# Patient Record
Sex: Female | Born: 1985
Health system: Southern US, Community
[De-identification: ages and names within clinical notes are randomized; demographics above are authoritative.]

## PROBLEM LIST (undated history)

## (undated) DIAGNOSIS — D219 Benign neoplasm of connective and other soft tissue, unspecified: Secondary | ICD-10-CM

## (undated) DIAGNOSIS — I10 Essential (primary) hypertension: Secondary | ICD-10-CM

## (undated) DIAGNOSIS — E119 Type 2 diabetes mellitus without complications: Secondary | ICD-10-CM

## (undated) DIAGNOSIS — Z3183 Encounter for assisted reproductive fertility procedure cycle: Secondary | ICD-10-CM

## (undated) HISTORY — DX: Encounter for assisted reproductive fertility procedure cycle: Z31.83

## (undated) HISTORY — DX: Type 2 diabetes mellitus without complications: E11.9

## (undated) HISTORY — PX: OTHER SURGICAL HISTORY: SHX169

---

## 2013-07-14 ENCOUNTER — Ambulatory Visit (INDEPENDENT_AMBULATORY_CARE_PROVIDER_SITE_OTHER): Payer: Self-pay | Admitting: Emergency Medicine

## 2013-07-14 VITALS — BP 102/64 | HR 58 | Temp 98.3°F | Ht 66.5 in | Wt 136.8 lb

## 2013-07-14 DIAGNOSIS — E119 Type 2 diabetes mellitus without complications: Secondary | ICD-10-CM

## 2013-07-14 LAB — GLUCOSE, POCT (MANUAL RESULT ENTRY): POC Glucose: 93 mg/dl (ref 70–99)

## 2013-07-14 LAB — POCT GLYCOSYLATED HEMOGLOBIN (HGB A1C): Hemoglobin A1C: 5.5

## 2013-07-14 NOTE — Progress Notes (Signed)
   Subjective:    Patient ID: Tina Brown, female    DOB: [birth year], 27 y.o.   MRN: [id number]  HPI  27 y.o. Female here from the [place] having trouble with her diabetes. She is on insulin and unable to remember what she takes. Left insulin at home. Takes 15 units once a day. Had her last injection of insulin was 5 days before coming to the [place]. Is drinking a lot of water and feels like she is having to void a lot more then normal. Took insulin at 10 am . Remembers insulin medication being a mixture. She states she checks her sugar about one time a month . Her last sugar she checked was 200.   Review of Systems     Objective:   Physical Exam patient is alert and cooperative she is in no distress. Her neck is supple. Her chest is clear. Heart regular rate no murmurs. Abdomen is soft no masses  Results for orders placed in visit on 07/14/13  GLUCOSE, POCT (MANUAL RESULT ENTRY)      Result Value Range   POC Glucose 93  70 - 99 mg/dl  POCT GLYCOSYLATED HEMOGLOBIN (HGB A1C)      Result Value Range   Hemoglobin A1C 5.5          Assessment & Plan:  Patient will check her sugar every day. We will make a decision at that time whether she needs to be on insulin or not. Afraid to put her on insulin with her sugars being so low. They are agreeable to check her sugar every day see me in about a week. If they see her sugar starting to rise we'll start her on a low-dose 70/30 mix around 7-10 units a day

## 2013-07-15 LAB — BASIC METABOLIC PANEL
BUN: 13 mg/dL (ref 6–23)
CO2: 23 mEq/L (ref 19–32)
Calcium: 9.6 mg/dL (ref 8.4–10.5)
Chloride: 104 mEq/L (ref 96–112)
Creat: 0.75 mg/dL (ref 0.50–1.10)
Glucose, Bld: 85 mg/dL (ref 70–99)
Potassium: 3.8 mEq/L (ref 3.5–5.3)
Sodium: 136 mEq/L (ref 135–145)

## 2013-07-21 ENCOUNTER — Ambulatory Visit (INDEPENDENT_AMBULATORY_CARE_PROVIDER_SITE_OTHER): Payer: Self-pay | Admitting: Emergency Medicine

## 2013-07-21 VITALS — BP 108/62 | HR 61 | Temp 98.7°F | Resp 18 | Ht 67.0 in | Wt 135.0 lb

## 2013-07-21 DIAGNOSIS — E119 Type 2 diabetes mellitus without complications: Secondary | ICD-10-CM

## 2013-07-21 NOTE — Progress Notes (Addendum)
Subjective:    Patient ID: Tina Brown, female    DOB: April 24, 1986, 28 y.o.   MRN: [id number] This chart was scribed for Nena Jordan, MD  by Quintin Alto, ED Scribe. This patient was seen in room 4 and the patient's care was started at 11:05 AM.  HPI Comments: Tina Brown is a 28 y.o. female who presents to the Emergency Department after she was seen here last week for checkup of her diabetes, she is here today for the same. She is recently is from [place] about 10 days ago and had been on insulin for the past 10 years. When she was seen here last, she did not have her insulin w/her b/c she left it in [place] and although was not on her insulin; she had normal blood sugar levels. Today she has her readings with her which appear normal. Pt reports that she has been monitoring her sugar intake and has been recording her glucose. Pt is feeling well, and denies any change in diet and getting up in the middle of the night for urination. Pt has a history of chronic diabetes.     Patient Active Problem List   Diagnosis Date Noted  . Diabetes 07/14/2013    Results for orders placed in visit on 08/67/61  BASIC METABOLIC PANEL      Result Value Range   Sodium 136  135 - 145 mEq/L   Potassium 3.8  3.5 - 5.3 mEq/L   Chloride 104  96 - 112 mEq/L   CO2 23  19 - 32 mEq/L   Glucose, Bld 85  70 - 99 mg/dL   BUN 13  6 - 23 mg/dL   Creat 0.75  0.50 - 1.10 mg/dL   Calcium 9.6  8.4 - 10.5 mg/dL  GLUCOSE, POCT (MANUAL RESULT ENTRY)      Result Value Range   POC Glucose 93  70 - 99 mg/dl  POCT GLYCOSYLATED HEMOGLOBIN (HGB A1C)      Result Value Range   Hemoglobin A1C 5.5      History   Social History  . Marital Status: Married    Spouse Name: N/A    Number of Children: N/A  . Years of Education: N/A   Occupational History  . Not on file.   Social History Main Topics  . Smoking status: Never Smoker   . Smokeless tobacco: Not on file  . Alcohol Use: No  . Drug Use: No  . Sexual Activity: Yes     Other Topics Concern  . Not on file   Social History Narrative  . No narrative on file      HPI    Review of Systems     Objective:   Physical Exam  . DIAGNOSTIC STUDIES: Oxygen Saturation is 99% on RA, normal by my interpretation.    COORDINATION OF CARE: 11:14 AM- Pt advised of plan for treatment and pt agrees.      Assessment & Plan:  Patient brings in her blood sugar readings and they vary between 77 for low at 134 high. She has no readings greater than 130. Will withhold treatment for diabetes at the present time. She is agreeable to check her sugars twice a week. If her sugars start to rise she needs to return to clinic to consider what medications are appropriate for her to be on.. I personally performed the services described in this documentation, which was scribed in my presence. The recorded information has been reviewed and is accurate.

## 2013-12-03 ENCOUNTER — Encounter (HOSPITAL_COMMUNITY): Payer: Self-pay | Admitting: *Deleted

## 2013-12-03 ENCOUNTER — Inpatient Hospital Stay (HOSPITAL_COMMUNITY)
Admission: AD | Admit: 2013-12-03 | Discharge: 2013-12-03 | Disposition: A | Discharge status: Home / Self Care | Payer: No Typology Code available for payment source | Source: Ambulatory Visit | Attending: Obstetrics & Gynecology | Admitting: Obstetrics & Gynecology

## 2013-12-03 DIAGNOSIS — B3731 Acute candidiasis of vulva and vagina: Secondary | ICD-10-CM | POA: Insufficient documentation

## 2013-12-03 DIAGNOSIS — E119 Type 2 diabetes mellitus without complications: Secondary | ICD-10-CM | POA: Insufficient documentation

## 2013-12-03 DIAGNOSIS — B373 Candidiasis of vulva and vagina: Secondary | ICD-10-CM

## 2013-12-03 DIAGNOSIS — R300 Dysuria: Secondary | ICD-10-CM | POA: Insufficient documentation

## 2013-12-03 DIAGNOSIS — M545 Low back pain, unspecified: Secondary | ICD-10-CM | POA: Insufficient documentation

## 2013-12-03 LAB — URINALYSIS, ROUTINE W REFLEX MICROSCOPIC
Bilirubin Urine: NEGATIVE
Glucose, UA: NEGATIVE mg/dL
Hgb urine dipstick: NEGATIVE
Ketones, ur: NEGATIVE mg/dL
Leukocytes, UA: NEGATIVE
Nitrite: NEGATIVE
Protein, ur: NEGATIVE mg/dL
Specific Gravity, Urine: 1.01 (ref 1.005–1.030)
Urobilinogen, UA: 0.2 mg/dL (ref 0.0–1.0)
pH: 7 (ref 5.0–8.0)

## 2013-12-03 LAB — WET PREP, GENITAL
Clue Cells Wet Prep HPF POC: NONE SEEN
Trich, Wet Prep: NONE SEEN

## 2013-12-03 LAB — POCT PREGNANCY, URINE: Preg Test, Ur: NEGATIVE

## 2013-12-03 MED ORDER — FLUCONAZOLE 150 MG PO TABS: 150.0000 mg | ORAL_TABLET | Freq: Every day | ORAL | Status: DC

## 2013-12-03 MED ORDER — FLUCONAZOLE 150 MG PO TABS
150.0000 mg | ORAL_TABLET | Freq: Once | ORAL | Status: AC
  Administered 2013-12-03: 150 mg via ORAL
  Filled 2013-12-03: qty 1

## 2013-12-03 NOTE — Discharge Instructions (Signed)
Candidal Vulvovaginitis Candidal vulvovaginitis is an infection of the vagina and vulva. The vulva is the skin around the opening of the vagina. This may cause itching and discomfort in and around the vagina.  HOME CARE  Only take medicine as told by your doctor.  Do not have sex (intercourse) until the infection is healed or as told by your doctor.  Practice safe sex.  Tell your sex partner about your infection.  Do not douche or use tampons.  Wear cotton underwear. Do not wear tight pants or panty hose.  Eat yogurt. This may help treat and prevent yeast infections. GET HELP RIGHT AWAY IF:   You have a fever.  Your problems get worse during treatment or do not get better in 3 days.  You have discomfort, irritation, or itching in your vagina or vulva area.  You have pain after sex.  You start to get belly (abdominal) pain. MAKE SURE YOU:  Understand these instructions.  Will watch your condition.  Will get help right away if you are not doing well or get worse. Document Released: 09/27/2008 Document Revised: 09/23/2011 Document Reviewed: 09/27/2008 [facility] Patient Information 2014 [place], [place].   Over-the-counter Ovulation Kits - at [employer], [facility], [facility] or [facility]

## 2013-12-03 NOTE — MAU Note (Signed)
PT  DOES NOT [place].Marland Kitchen     SAYS SHE WENT TO [facility] 06-2013-  THEY TOLD HER SHE WAS NOT DIABETIC.     SAYS BURNING WITH VOIDING- STARTED ON WED,  AND  HAS WHITE D/C  WITH URINE.    HAS HAD UTI BEFORE.  LAST SEX-Tuesday.  NO BIRTH CONTROL

## 2013-12-03 NOTE — MAU Provider Note (Signed)
History     CSN: [id number]  Arrival date and time: 12/03/13 2138   First Provider Initiated Contact with Patient 12/03/13 2209      No chief complaint on file.  HPI Ms. Tina Brown is a 28 y.o. G0P0 who presents to MAU today with complaint of dysuria x 3 days. The patient also endorses urinary frequency and vaginal discharge that is white. She denies urgency or fever. She has mild low back pain, but denies pelvic pain. LMP 11/22/13. Last intercourse 11/30/13. No birth control.    OB History   Grav Para Term Preterm Abortions TAB SAB Ect Mult Living   0               Past Medical History  Diagnosis Date  . Diabetes mellitus without complication     History reviewed. No pertinent past surgical history.  Family History  Problem Relation Age of Onset  . Diabetes Father     History  Substance Use Topics  . Smoking status: Never Smoker   . Smokeless tobacco: Not on file  . Alcohol Use: No    Allergies: No Known Allergies  No prescriptions prior to admission    Review of Systems  Constitutional: Negative for fever and malaise/fatigue.  Gastrointestinal: Positive for abdominal pain. Negative for nausea, vomiting and diarrhea.  Genitourinary: Positive for dysuria and frequency. Negative for urgency, hematuria and flank pain.       Neg - vaginal bleeding + vaginal discharge  Musculoskeletal: Positive for back pain.   Physical Exam   Blood pressure 113/59, pulse 71, temperature 97.4 F (36.3 C), temperature source Oral, resp. rate 20, height 5\' 6"  (1.676 m), weight 145 lb (65.772 kg).  Physical Exam  Constitutional: She is oriented to person, place, and time. She appears well-developed and well-nourished. No distress.  HENT:  Head: Normocephalic and atraumatic.  Cardiovascular: Normal rate.   Respiratory: Effort normal.  GI: Soft. Bowel sounds are normal. She exhibits no distension and no mass. There is no tenderness. There is no rebound, no guarding and no CVA  tenderness.  Genitourinary: Vaginal discharge (small amount of thick, white discharge noted) found.  Patient unable to tolerate speculum exam. Swabs obtained.   Neurological: She is alert and oriented to person, place, and time.  Skin: Skin is warm and dry. No erythema.  Psychiatric: She has a normal mood and affect.   Results for orders placed during the hospital encounter of 12/03/13 (from the past 24 hour(s))  URINALYSIS, ROUTINE W REFLEX MICROSCOPIC     Status: None   Collection Time    12/03/13  9:50 PM      Result Value Ref Range   Color, Urine YELLOW  YELLOW   APPearance CLEAR  CLEAR   Specific Gravity, Urine 1.010  1.005 - 1.030   pH 7.0  5.0 - 8.0   Glucose, UA NEGATIVE  NEGATIVE mg/dL   Hgb urine dipstick NEGATIVE  NEGATIVE   Bilirubin Urine NEGATIVE  NEGATIVE   Ketones, ur NEGATIVE  NEGATIVE mg/dL   Protein, ur NEGATIVE  NEGATIVE mg/dL   Urobilinogen, UA 0.2  0.0 - 1.0 mg/dL   Nitrite NEGATIVE  NEGATIVE   Leukocytes, UA NEGATIVE  NEGATIVE  POCT PREGNANCY, URINE     Status: None   Collection Time    12/03/13  9:55 PM      Result Value Ref Range   Preg Test, Ur NEGATIVE  NEGATIVE  WET PREP, GENITAL     Status: Abnormal  Collection Time    12/03/13 10:20 PM      Result Value Ref Range   Yeast Wet Prep HPF POC FEW (*) NONE SEEN   Trich, Wet Prep NONE SEEN  NONE SEEN   Clue Cells Wet Prep HPF POC NONE SEEN  NONE SEEN   WBC, Wet Prep HPF POC FEW (*) NONE SEEN    MAU Course  Procedures None  MDM UPT - negative UA, wet prep and GC/Chlamydia today Diflucan 150 mg given in MAU Assessment and Plan  A: Yeast vulvovaginitis  P: Discharge home Rx for Diflucan given to patient to be taken in 3 days if symptoms persist Patient may return to MAU as needed or if her condition were to change or worsen  Farris Has, PA-C  12/03/2013, 10:52 PM

## 2013-12-04 LAB — GC/CHLAMYDIA PROBE AMP
CT Probe RNA: NEGATIVE
GC Probe RNA: NEGATIVE

## 2013-12-04 NOTE — MAU Provider Note (Signed)
Attestation of Attending Supervision of Advanced Practitioner (CNM/NP): Evaluation and management procedures were performed by the Advanced Practitioner under my supervision and collaboration.  I have reviewed the Advanced Practitioner's note and chart, and I agree with the management and plan.  Jesiah Grismer Harraway-Smith 12:01 AM

## 2014-01-24 ENCOUNTER — Other Ambulatory Visit (HOSPITAL_COMMUNITY): Payer: Self-pay | Admitting: Obstetrics & Gynecology

## 2014-01-24 DIAGNOSIS — N979 Female infertility, unspecified: Secondary | ICD-10-CM

## 2014-01-31 ENCOUNTER — Encounter (INDEPENDENT_AMBULATORY_CARE_PROVIDER_SITE_OTHER): Payer: Self-pay

## 2014-01-31 ENCOUNTER — Ambulatory Visit (HOSPITAL_COMMUNITY)
Admission: RE | Admit: 2014-01-31 | Discharge: 2014-01-31 | Disposition: A | Discharge status: Home / Self Care | Payer: No Typology Code available for payment source | Source: Ambulatory Visit | Attending: Obstetrics & Gynecology | Admitting: Obstetrics & Gynecology

## 2014-01-31 DIAGNOSIS — N979 Female infertility, unspecified: Secondary | ICD-10-CM | POA: Insufficient documentation

## 2014-01-31 MED ORDER — IOHEXOL 300 MG/ML  SOLN
20.0000 mL | Freq: Once | INTRAMUSCULAR | Status: AC | PRN
  Administered 2014-01-31: 20 mL

## 2014-02-24 ENCOUNTER — Encounter: Payer: Self-pay | Admitting: Internal Medicine

## 2014-03-18 ENCOUNTER — Ambulatory Visit: Payer: No Typology Code available for payment source | Admitting: Internal Medicine

## 2014-04-08 ENCOUNTER — Ambulatory Visit (INDEPENDENT_AMBULATORY_CARE_PROVIDER_SITE_OTHER): Payer: No Typology Code available for payment source | Admitting: Internal Medicine

## 2014-04-08 ENCOUNTER — Encounter: Payer: Self-pay | Admitting: Internal Medicine

## 2014-04-08 VITALS — BP 94/65 | HR 67 | Temp 98.3°F | Ht 66.0 in | Wt 142.8 lb

## 2014-04-08 DIAGNOSIS — E118 Type 2 diabetes mellitus with unspecified complications: Secondary | ICD-10-CM

## 2014-04-08 MED ORDER — METFORMIN HCL 500 MG PO TABS: 500.0000 mg | ORAL_TABLET | Freq: Two times a day (BID) | ORAL | Status: DC

## 2014-04-08 NOTE — Progress Notes (Signed)
Patient ID: Tina Brown, female   DOB: 02-04-1986, 28 y.o.   MRN: [id number]  HPI: Noah Pelaez is a 28 y.o.-year-old female from [place]), referred by Dr. Dellis Filbert, for management of DM2, non-insulin-dependent, controlled, without complications. She is here with her aunt who translates for [place].  Patient has been diagnosed with diabetes in 2008; she was on insulin before, stopped in 9 mo ago.  Last hemoglobin A1c was: 01/19/2014: HbA1c 7.4% Lab Results  Component Value Date   HGBA1C 5.5 07/14/2013   Pt is on: - Metformin 500 mg in am (despite advised to take it 2x a day >> was feeling tired on this dose). She has nausea in am.  Pt checks her sugars once a day and they are: - am: 86, 100-120 - 2h after b'fast: - before lunch: - 2h after lunch: - before dinner: n/c - 1h after dinner: 190 - bedtime: n/c - nighttime: n/c No lows. Lowest sugar was 86;? she has hypoglycemia awareness. Highest sugar was 190.  Pt's meals are: - Breakfast: egg, milk - Lunch: chicken, fruit, veggies - Dinner: yoghurt, milk - Snacks: 1 a day  - no CKD, last BUN/creatinine:  Lab Results  Component Value Date   BUN 13 07/14/2013   CREATININE 0.75 07/14/2013  Not on ACEI. - no HL. No available Lipid results. Not on a statin. - last eye exam was "long time ago". No DR.  - no numbness and tingling in her feet.  Pt has FH of DM in father, PGM.  ROS: Constitutional: + weight loss, no fatigue, no subjective hyperthermia/hypothermia Eyes: no blurry vision, no xerophthalmia ENT: no sore throat, no nodules palpated in throat, no dysphagia/odynophagia, no hoarseness Cardiovascular: no CP/+ SOB/no palpitations/leg swelling Respiratory: no cough/+ SOB Gastrointestinal: no N/V/D/C Musculoskeletal: no muscle/joint aches Skin: no rashes, + hair loss Neurological: no tremors/numbness/tingling/dizziness Psychiatric: no depression/anxiety  Past Medical History  Diagnosis Date  . Diabetes mellitus without  complication    No past surgical history other than DM2.   History   Social History  . Marital Status: Married    Spouse Name: N/A    Number of Children: 0   Occupational History  . student   Social History Main Topics  . Smoking status: Never Smoker   . Smokeless tobacco: Not on file  . Alcohol Use: No  . Drug Use: No  . Sexual Activity: Yes   NKDA  Meds: - Metformin 500 mg daily in am  No Known Allergies Family History  Problem Relation Age of Onset  . Diabetes Father    PE: BP 94/65  Pulse 67  Temp(Src) 98.3 F (36.8 C) (Oral)  Ht 5\' 6"  (1.676 m)  Wt 142 lb 12.8 oz (64.774 kg)  BMI 23.06 kg/m2  SpO2 98% Wt Readings from Last 3 Encounters:  04/08/14 142 lb 12.8 oz (64.774 kg)  12/03/13 145 lb (65.772 kg)  07/21/13 135 lb (61.236 kg)   Constitutional: normal weight, in NAD Eyes: PERRLA, EOMI, no exophthalmos ENT: moist mucous membranes, + symmetric thyromegaly, no cervical lymphadenopathy Cardiovascular: RRR, No MRG Respiratory: CTA B Gastrointestinal: abdomen soft, NT, ND, BS+ Musculoskeletal: no deformities, strength intact in all 4 Skin: moist, warm, no rashes Neurological: no tremor with outstretched hands, DTR normal in all 4  ASSESSMENT: 1. DM2, non-insulin-dependent, controlled, without complications  PLAN:  1. Patient with long-standing, controlled diabetes, previously on insulin while in [place], off meds for 7 mo after coming to [place] and now on Metformin 500 mg in am  for the last 2 months. A1c off meds >> 5.5% >> 7.5%. - We discussed about options for treatment, and I suggested to:  Patient Instructions  Please increase Metformin to 500 mg 2x a day with meals. Please return in 1.5 months with your sugar log.  - Strongly advised her to start checking sugars at different times of the day - check 1-2 times a day, rotating checks - given sugar log and advised how to fill it and to bring it at next appt  - given foot care handout and explained the  principles  - given instructions for hypoglycemia management "15-15 rule"  - advised for yearly eye exams >> needs one - Return to clinic in 1.5 mo with sugar log

## 2014-04-08 NOTE — Patient Instructions (Signed)
Please increase Metformin to 500 mg 2x a day with meals.  Please return in 1.5 months with your sugar log.   PATIENT INSTRUCTIONS FOR TYPE 2 DIABETES:  **Please join MyChart!** - see attached instructions about how to join if you have not done so already.  DIET AND EXERCISE Diet and exercise is an important part of diabetic treatment.  We recommended aerobic exercise in the form of brisk walking (working between 40-60% of maximal aerobic capacity, similar to brisk walking) for 150 minutes per week (such as 30 minutes five days per week) along with 3 times per week performing 'resistance' training (using various gauge rubber tubes with handles) 5-10 exercises involving the major muscle groups (upper body, lower body and core) performing 10-15 repetitions (or near fatigue) each exercise. Start at half the above goal but build slowly to reach the above goals. If limited by weight, joint pain, or disability, we recommend daily walking in a swimming pool with water up to waist to reduce pressure from joints while allow for adequate exercise.    BLOOD GLUCOSES Monitoring your blood glucoses is important for continued management of your diabetes. Please check your blood glucoses 2-4 times a day: fasting, before meals and at bedtime (you can rotate these measurements - e.g. one day check before the 3 meals, the next day check before 2 of the meals and before bedtime, etc.).   HYPOGLYCEMIA (low blood sugar) Hypoglycemia is usually a reaction to not eating, exercising, or taking too much insulin/ other diabetes drugs.  Symptoms include tremors, sweating, hunger, confusion, headache, etc. Treat IMMEDIATELY with 15 grams of Carbs:   4 glucose tablets    cup regular juice/soda   2 tablespoons raisins   4 teaspoons sugar   1 tablespoon honey Recheck blood glucose in 15 mins and repeat above if still symptomatic/blood glucose <100.  RECOMMENDATIONS TO REDUCE YOUR RISK OF DIABETIC COMPLICATIONS: * Take  your prescribed MEDICATION(S) * Follow a DIABETIC diet: Complex carbs, fiber rich foods, (monounsaturated and polyunsaturated) fats * AVOID saturated/trans fats, high fat foods, >2,300 mg salt per day. * EXERCISE at least 5 times a week for 30 minutes or preferably daily.  * DO NOT SMOKE OR DRINK more than 1 drink a day. * Check your FEET every day. Do not wear tightfitting shoes. Contact us if you develop an ulcer * See your EYE doctor once a year or more if needed * Get a FLU shot once a year * Get a PNEUMONIA vaccine once before and once after age 57 years  GOALS:  * Your Hemoglobin A1c of <7%  * fasting sugars need to be <130 * after meals sugars need to be <180 (2h after you start eating) * Your Systolic BP should be 092 or lower  * Your Diastolic BP should be 80 or lower  * Your HDL (Good Cholesterol) should be 40 or higher  * Your LDL (Bad Cholesterol) should be 100 or lower. * Your Triglycerides should be 150 or lower  * Your Urine microalbumin (kidney function) should be <30 * Your Body Mass Index should be 25 or lower

## 2014-04-08 NOTE — Progress Notes (Signed)
Pre visit review using our clinic review tool, if applicable. No additional management support is needed unless otherwise documented below in the visit note. 

## 2014-05-20 ENCOUNTER — Ambulatory Visit: Payer: No Typology Code available for payment source | Admitting: Internal Medicine

## 2014-05-20 ENCOUNTER — Telehealth: Payer: Self-pay | Admitting: Internal Medicine

## 2014-05-20 DIAGNOSIS — Z0289 Encounter for other administrative examinations: Secondary | ICD-10-CM

## 2014-05-20 NOTE — Telephone Encounter (Signed)
1-3 mo

## 2014-05-20 NOTE — Telephone Encounter (Signed)
Patient no showed today's appt. Please advise on how to follow up. °A. No follow up necessary. °B. Follow up urgent. Contact patient immediately. °C. Follow up necessary. Contact patient and schedule visit in ___ days. °D. Follow up advised. Contact patient and schedule visit in ____weeks. ° °

## 2015-05-02 DIAGNOSIS — R739 Hyperglycemia, unspecified: Secondary | ICD-10-CM

## 2015-05-02 NOTE — Congregational Nurse Program (Signed)
Office visit to request B/S testing and information about diabetes. Unable to perform own testing due to instrument failure. Currently on Metformin HCL 500 MG Bid. Concerned about weight loss. Need PCP. B/P 102/65. Pulse 87. Blood sugar 394 non-fasting. Weight 134.1. Height 166cm.  Plan: Use Arabic interpreter; Continue to take med as directed; advise walking exercise x 3 weekly; increase  water intake. Review disease management and dietary management; written materials in Arabic, food portions and foods comparable to own cultural foods. Refer to agency SW, [place] for [employer]. Refer to PCP once O.C. assigned. Return for B/S recheck 05/03/15 fasting at 9:30am. Need diabetic education classes in future.

## 2015-05-20 ENCOUNTER — Other Ambulatory Visit: Payer: Self-pay | Admitting: Internal Medicine

## 2015-06-20 DIAGNOSIS — E119 Type 2 diabetes mellitus without complications: Secondary | ICD-10-CM

## 2015-07-05 ENCOUNTER — Encounter: Payer: Self-pay | Admitting: Internal Medicine

## 2015-07-05 ENCOUNTER — Other Ambulatory Visit (INDEPENDENT_AMBULATORY_CARE_PROVIDER_SITE_OTHER): Payer: Self-pay | Admitting: *Deleted

## 2015-07-05 ENCOUNTER — Ambulatory Visit (INDEPENDENT_AMBULATORY_CARE_PROVIDER_SITE_OTHER): Payer: Self-pay | Admitting: Internal Medicine

## 2015-07-05 VITALS — BP 102/60 | HR 86 | Temp 97.4°F | Resp 12 | Wt 138.0 lb

## 2015-07-05 DIAGNOSIS — E119 Type 2 diabetes mellitus without complications: Secondary | ICD-10-CM

## 2015-07-05 LAB — POCT GLYCOSYLATED HEMOGLOBIN (HGB A1C): Hemoglobin A1C: 14.2

## 2015-07-05 MED ORDER — INSULIN PEN NEEDLE 32G X 4 MM MISC: Status: DC

## 2015-07-05 MED ORDER — INSULIN NPH (HUMAN) (ISOPHANE) 100 UNIT/ML ~~LOC~~ SUSP: SUBCUTANEOUS | Status: DC

## 2015-07-05 NOTE — Progress Notes (Signed)
Patient ID: Tina Brown, female   DOB: 07-Jul-1986, 29 y.o.   MRN: [id number]  HPI: Tina Brown is a 29 y.o.-year-old female from [place]), initially referred by Dr. Dellis Filbert, for management of DM2, dx 2008, insulin-dependent, controlled, without complications. She is here by herself, but she does not speak English very well.  No insurance.  She was on insulin before, stopped in 2.5 years ago.  She has increased thirst and urination. She also has weight loss, blurry vision.  Last hemoglobin A1c was: 01/19/2014: HbA1c 7.4% Lab Results  Component Value Date   HGBA1C 5.5 07/14/2013   Pt is on: - Metformin 500 mg 1x a day  (did not increase to 2x a day)  Pt does not check sugars. No meter. Sugars from last visit: - am: 86, 100-120 - 2h after b'fast: - before lunch: - 2h after lunch: - before dinner: n/c - 1h after dinner: 190 - bedtime: n/c - nighttime: n/c  Pt's meals are: - Breakfast: egg, milk - Lunch: chicken, fruit, veggies - Dinner: yoghurt, milk - Snacks: 1 a day  - no CKD, last BUN/creatinine:  Lab Results  Component Value Date   BUN 13 07/14/2013   CREATININE 0.75 07/14/2013  Not on ACEI. - no HL. No available Lipid results. Not on a statin. - last eye exam was "long time ago". No DR.  - no numbness and tingling in her feet.  ROS: Constitutional: + weight loss, no fatigue, no subjective hyperthermia/hypothermia, + nocturia Eyes: no blurry vision, no xerophthalmia ENT: no sore throat, no nodules palpated in throat, no dysphagia/odynophagia, no hoarseness Cardiovascular: no CP/+ SOB/no palpitations/leg swelling Respiratory: no cough/+ SOB Gastrointestinal: no N/V/D/C Musculoskeletal: no muscle/joint aches Skin: no rashes, + hair loss Neurological: no tremors/numbness/tingling/dizziness  I reviewed pt's medications, allergies, PMH, social hx, family hx, and changes were documented in the history of present illness. Otherwise, unchanged from my initial visit  note.  Past Medical History  Diagnosis Date  . Diabetes mellitus without complication ([place])    No past surgical history other than DM2.   History   Social History  . Marital Status: Married    Spouse Name: N/A    Number of Children: 0   Occupational History  . student   Social History Main Topics  . Smoking status: Never Smoker   . Smokeless tobacco: Not on file  . Alcohol Use: No  . Drug Use: No  . Sexual Activity: Yes   NKDA  Meds: - Metformin 500 mg daily in am  No Known Allergies Family History  Problem Relation Age of Onset  . Diabetes Father    PE: BP 102/60 mmHg  Pulse 86  Temp(Src) 97.4 F (36.3 C) (Oral)  Resp 12  Wt 138 lb (62.596 kg)  SpO2 98% Wt Readings from Last 3 Encounters:  07/05/15 138 lb (62.596 kg)  04/08/14 142 lb 12.8 oz (64.774 kg)  12/03/13 145 lb (65.772 kg)   Constitutional: normal weight, in NAD Eyes: PERRLA, EOMI, no exophthalmos ENT: moist mucous membranes, + symmetric thyromegaly, no cervical lymphadenopathy Cardiovascular: RRR, No MRG Respiratory: CTA B Gastrointestinal: abdomen soft, NT, ND, BS+ Musculoskeletal: no deformities, strength intact in all 4 Skin: moist, warm, no rashes Neurological: no tremor with outstretched hands, DTR normal in all 4  ASSESSMENT: 1. DM2, insulin-dependent, controlled, without complications  PLAN:  1. Patient with long-standing, controlled diabetes, previously on insulin while in [place], off meds at last visit last year. She was lost for f/u >> now comes  with sxs of hyperglycemia and not checking sugars. Checked HbA1c today >> 14.2% (very high) >> we need basal insulin. Leonia Reader (DM edu) is showing pt how to do the injections and giving her 1st inj. - I suggested to:  Patient Instructions  Please buy a ReliOn glucometer from [place].  Please continue Metformin 500 mg daily.  Please start NPH insulin; - 15 units in am - 10 units at bedtime  If sugars still high in 1 week,  increase to: - 20 units in am  - 15 units at bedtime  Check sugars 2x a day until next visit.  Please return in 3-4 weeks with your sugar log. Please bring an interpreter with you.  - Strongly advised her to start checking sugars at different times of the day - check 2 times a day, rotating checks - advised for yearly eye exams >> needs one - Return to clinic in 3-4 weeks with sugar log

## 2015-07-05 NOTE — Patient Instructions (Addendum)
Please buy a ReliOn glucometer from [place].  Please continue Metformin 500 mg daily.  Please start NPH insulin; - 15 units in am - 10 units at bedtime  If sugars still high in 1 week, increase to: - 20 units in am  - 15 units at bedtime  Check sugars 2x a day until next visit.  Please return in 3-4 weeks with your sugar log. Please bring an interpreter with you.

## 2015-08-29 ENCOUNTER — Ambulatory Visit: Payer: Self-pay | Admitting: Internal Medicine

## 2015-09-06 ENCOUNTER — Telehealth: Payer: Self-pay | Admitting: Internal Medicine

## 2015-09-06 ENCOUNTER — Other Ambulatory Visit: Payer: Self-pay | Admitting: Internal Medicine

## 2015-09-06 NOTE — Telephone Encounter (Signed)
Patient has question about her medication, I couldn't understand her, please give her a call.

## 2015-09-07 MED ORDER — INSULIN NPH (HUMAN) (ISOPHANE) 100 UNIT/ML ~~LOC~~ SUSP: SUBCUTANEOUS | Status: DC

## 2015-09-07 NOTE — Telephone Encounter (Signed)
Pt is requesting refill of insulin please call it to # [phone] [facility]

## 2015-09-07 NOTE — Telephone Encounter (Signed)
Refill sent to pt's pharmacy. 

## 2015-09-13 NOTE — Telephone Encounter (Signed)
Returned pt's call and lvm with cousin.

## 2015-11-01 DIAGNOSIS — R309 Painful micturition, unspecified: Secondary | ICD-10-CM

## 2015-11-01 DIAGNOSIS — E119 Type 2 diabetes mellitus without complications: Secondary | ICD-10-CM

## 2015-11-01 DIAGNOSIS — N898 Other specified noninflammatory disorders of vagina: Secondary | ICD-10-CM

## 2015-11-01 NOTE — Congregational Nurse Program (Unsigned)
Congregational Nurse Program Note  Date of Encounter: 11/01/2015  Past Medical History: No past medical history on file.  Encounter Details:     CNP Questionnaire - 11/01/15 2038    Patient Demographics   Is this a new or existing patient? Existing   Patient Assistance   Uninsured Patient Yes   Patient referred to apply for the following financial assistance [employer] or to a Navigator   Food insecurities addressed Not Applicable   Encounter Details   Patient referred to Urgent Care     Office visit to see nurse by this Arabic speaking [place] lady. Able to speak limited English during interview; need interpreter for complex medical information. Complain today is about a vaginal discharge with itching for several days. Also has low back pain and urinary pain. Controls diabetes with insulin. Currently concerned about medical coverage status; applying for health coverage with assistance from the Student law intern at [employer]. Expressed concern about her and spouse's  inability to conceive a child at this age. Became very emotional talking about her childless marriage. Requested information about infertility and treatment. Plan: Refer to [facility]. Discuss insurance issues with agency SW. Return one week for further education and information about diabetes. Provide information about infertility specialists and support counseling for couple.  Jannetta Quint, RN/CN. [phone]  Follow-up office visit to get more information on infertility and treatment centers. Currently has no insurance coverage. Aware of need for medical insurance before proceeding with further evaluations. Very distressed about childless marriage. Denies problems with diabetes. Continues on daily insulin. Did not bring dosage information. Plan:Referred back to SW at [facility] to apply for [facility] until enrollment in [facility]. Printed information on infertility from [employer]. Follow-up 1 week for blood pressure  recheck and review dietary needs for diabetes; review daily caloric intake. Monitor weight in future. Increase H2O intake. Return 1 week. Lynnann Knudsen,RN/CN. [phone].

## 2015-12-01 ENCOUNTER — Encounter (HOSPITAL_COMMUNITY): Payer: Self-pay | Admitting: Emergency Medicine

## 2015-12-01 ENCOUNTER — Emergency Department (HOSPITAL_COMMUNITY)
Admission: EM | Admit: 2015-12-01 | Discharge: 2015-12-02 | Disposition: A | Discharge status: Home / Self Care | Payer: Self-pay | Attending: Emergency Medicine | Admitting: Emergency Medicine

## 2015-12-01 DIAGNOSIS — L293 Anogenital pruritus, unspecified: Secondary | ICD-10-CM | POA: Insufficient documentation

## 2015-12-01 DIAGNOSIS — N898 Other specified noninflammatory disorders of vagina: Secondary | ICD-10-CM | POA: Insufficient documentation

## 2015-12-01 DIAGNOSIS — Z79899 Other long term (current) drug therapy: Secondary | ICD-10-CM | POA: Insufficient documentation

## 2015-12-01 DIAGNOSIS — Z794 Long term (current) use of insulin: Secondary | ICD-10-CM | POA: Insufficient documentation

## 2015-12-01 DIAGNOSIS — Z7984 Long term (current) use of oral hypoglycemic drugs: Secondary | ICD-10-CM | POA: Insufficient documentation

## 2015-12-01 DIAGNOSIS — Z3202 Encounter for pregnancy test, result negative: Secondary | ICD-10-CM | POA: Insufficient documentation

## 2015-12-01 DIAGNOSIS — E119 Type 2 diabetes mellitus without complications: Secondary | ICD-10-CM | POA: Insufficient documentation

## 2015-12-01 LAB — CBC WITH DIFFERENTIAL/PLATELET
Basophils Absolute: 0 10*3/uL (ref 0.0–0.1)
Basophils Relative: 1 %
Eosinophils Absolute: 0.1 10*3/uL (ref 0.0–0.7)
Eosinophils Relative: 2 %
HCT: 41.1 % (ref 36.0–46.0)
Hemoglobin: 13.6 g/dL (ref 12.0–15.0)
Lymphocytes Relative: 45 %
Lymphs Abs: 2.8 10*3/uL (ref 0.7–4.0)
MCH: 28.2 pg (ref 26.0–34.0)
MCHC: 33.1 g/dL (ref 30.0–36.0)
MCV: 85.3 fL (ref 78.0–100.0)
Monocytes Absolute: 0.5 10*3/uL (ref 0.1–1.0)
Monocytes Relative: 8 %
Neutro Abs: 2.7 10*3/uL (ref 1.7–7.7)
Neutrophils Relative %: 44 %
Platelets: 276 10*3/uL (ref 150–400)
RBC: 4.82 MIL/uL (ref 3.87–5.11)
RDW: 12.8 % (ref 11.5–15.5)
WBC: 6.1 10*3/uL (ref 4.0–10.5)

## 2015-12-01 LAB — BASIC METABOLIC PANEL
Anion gap: 12 (ref 5–15)
BUN: 10 mg/dL (ref 6–20)
CO2: 23 mmol/L (ref 22–32)
Calcium: 9.3 mg/dL (ref 8.9–10.3)
Chloride: 98 mmol/L — ABNORMAL LOW (ref 101–111)
Creatinine, Ser: 0.6 mg/dL (ref 0.44–1.00)
GFR calc Af Amer: >60 mL/min (ref >60)
GFR calc non Af Amer: >60 mL/min (ref >60)
Glucose, Bld: 396 mg/dL — ABNORMAL HIGH (ref 65–99)
Potassium: 3.9 mmol/L (ref 3.5–5.1)
Sodium: 133 mmol/L — ABNORMAL LOW (ref 135–145)

## 2015-12-01 LAB — URINALYSIS, ROUTINE W REFLEX MICROSCOPIC
Bilirubin Urine: NEGATIVE
Glucose, UA: >1000 mg/dL — AB
Hgb urine dipstick: NEGATIVE
Ketones, ur: 40 mg/dL — AB
Leukocytes, UA: NEGATIVE
Nitrite: NEGATIVE
Protein, ur: NEGATIVE mg/dL
Specific Gravity, Urine: 1.02 (ref 1.005–1.030)
pH: 5 (ref 5.0–8.0)

## 2015-12-01 LAB — URINE MICROSCOPIC-ADD ON: RBC / HPF: NONE SEEN RBC/hpf (ref 0–5)

## 2015-12-01 LAB — WET PREP, GENITAL
Clue Cells Wet Prep HPF POC: NONE SEEN
Sperm: NONE SEEN
Trich, Wet Prep: NONE SEEN
Yeast Wet Prep HPF POC: NONE SEEN

## 2015-12-01 LAB — CBG MONITORING, ED: Glucose-Capillary: 274 mg/dL — ABNORMAL HIGH (ref 65–99)

## 2015-12-01 LAB — POC URINE PREG, ED: Preg Test, Ur: NEGATIVE

## 2015-12-01 NOTE — ED Notes (Signed)
Pt. reports white vaginal discharge , vaginal itching/irritation and dysuria onset this week . Arabic interpreter service utilized at triage .

## 2015-12-01 NOTE — ED Provider Notes (Signed)
CSN: [id number]     Arrival date & time 12/01/15  1959 History   First MD Initiated Contact with Patient 12/01/15 2132     Chief Complaint  Patient presents with  . Vaginal Discharge     (Consider location/radiation/quality/duration/timing/severity/associated sxs/prior Treatment) HPI Comments: Patient presents today with a chief complaint of vaginal discharge and vaginal itching.  She reports that the discharge and itching have been present since yesterday.  Discharge is whitish in color.  She staes that she has a similar symptoms in the past.  She denies nausea, vomiting, abdominal pain, pelvic pain, fever, chills, or urinary symptoms.  She states that she is sexually active with her husband.  She does have a history of DM and states that her blood sugars have been running in the 200's.  She states that she is currently on Metformin, which she has been taking.  She reports that she is not taking Insulin.    Patient is a 30 y.o. female presenting with vaginal discharge. The history is provided by the patient. The history is limited by a language barrier. A language interpreter was used (Estate agent used).  Vaginal Discharge   Past Medical History  Diagnosis Date  . Diabetes mellitus without complication ([place])    History reviewed. No pertinent past surgical history. Family History  Problem Relation Age of Onset  . Diabetes Father    Social History  Substance Use Topics  . Smoking status: Never Smoker   . Smokeless tobacco: None  . Alcohol Use: No   OB History    Gravida Para Term Preterm AB TAB SAB Ectopic Multiple Living   0              Review of Systems  Genitourinary: Positive for vaginal discharge.  All other systems reviewed and are negative.     Allergies  Review of patient's allergies indicates no known allergies.  Home Medications   Prior to Admission medications   Medication Sig Start Date End Date Taking? Authorizing Provider  Biotin w/  Vitamins C & E (HAIR/SKIN/NAILS PO) Take 1 tablet by mouth daily as needed.   Yes Historical Provider, MD  folic acid (FOLVITE) A999333 MCG tablet Take 400 mcg by mouth daily.   Yes Historical Provider, MD  metFORMIN (GLUCOPHAGE) 500 MG tablet TAKE 1 TABLET (500 MG TOTAL) BY MOUTH 2 (TWO) TIMES DAILY WITH A MEAL. 09/07/15  Yes Philemon Kingdom, MD  insulin NPH Human (HUMULIN N) 100 UNIT/ML injection Inject 15 units in am and 10 units at bedtime - pens please. Patient not taking: Reported on 12/01/2015 09/07/15   Philemon Kingdom, MD  Insulin Pen Needle (CAREFINE PEN NEEDLES) 32G X 4 MM MISC Use 2x  aday 07/05/15   Philemon Kingdom, MD   BP 129/94 mmHg  Pulse 86  Temp(Src) 98.7 F (37.1 C) (Oral)  Resp 18  SpO2 98%  LMP 11/18/2015 Physical Exam  Constitutional: She appears well-developed and well-nourished.  HENT:  Head: Normocephalic and atraumatic.  Mouth/Throat: Oropharynx is clear and moist.  Neck: Normal range of motion. Neck supple.  Cardiovascular: Normal rate, regular rhythm and normal heart sounds.   Pulmonary/Chest: Effort normal and breath sounds normal.  Abdominal: Soft. Bowel sounds are normal. She exhibits no distension and no mass. There is no tenderness. There is no rebound and no guarding.  Genitourinary: Cervix exhibits no motion tenderness. Right adnexum displays no mass, no tenderness and no fullness. Left adnexum displays no mass, no tenderness and no fullness.  Small amount of whitish colored discharge  Neurological: She is alert.  Skin: Skin is warm and dry.  Psychiatric: She has a normal mood and affect.  Nursing note and vitals reviewed.   ED Course  Procedures (including critical care time) Labs Review Labs Reviewed  URINALYSIS, ROUTINE W REFLEX MICROSCOPIC (NOT AT [facility]) - Abnormal; Notable for the following:    Glucose, UA >1000 (*)    Ketones, ur 40 (*)    All other components within normal limits  BASIC METABOLIC PANEL - Abnormal; Notable for the following:     Sodium 133 (*)    Chloride 98 (*)    Glucose, Bld 396 (*)    All other components within normal limits  URINE MICROSCOPIC-ADD ON - Abnormal; Notable for the following:    Squamous Epithelial / LPF 0-5 (*)    Bacteria, UA RARE (*)    All other components within normal limits  WET PREP, GENITAL  CBC WITH DIFFERENTIAL/PLATELET  RPR  HIV ANTIBODY (ROUTINE TESTING)  POC URINE PREG, ED  GC/CHLAMYDIA PROBE AMP ([place]) NOT AT [facility]    Imaging Review No results found. I have personally reviewed and evaluated these images and lab results as part of my medical decision-making.   EKG Interpretation None      MDM   Final diagnoses:  None   Patient presents today with complaints of vaginal itching and whitish colored discharge that has been present since yesterday.  GC/Chlamydia pending.  Wet prep is negative aside from [facility].  No CMT or adnexal tenderness on exam.   Feel that the patient is stable for discharge.  Return precautions given.    Hyman Bible, PA-C 12/03/15 [place], MD 12/03/15 2330

## 2015-12-01 NOTE — ED Notes (Signed)
CBG 274. RN notified.

## 2015-12-02 LAB — RPR: RPR Ser Ql: NONREACTIVE

## 2015-12-02 LAB — HIV ANTIBODY (ROUTINE TESTING W REFLEX): HIV Screen 4th Generation wRfx: NONREACTIVE

## 2015-12-02 MED ORDER — FLUCONAZOLE 150 MG PO TABS: 150.0000 mg | ORAL_TABLET | Freq: Once | ORAL | Status: DC

## 2015-12-02 NOTE — ED Notes (Signed)
Patient able to ambulate independently Interpreter phone used for discharge instructions

## 2015-12-02 NOTE — Discharge Instructions (Signed)
I think you may have a vaginal yeast infection.

## 2015-12-04 LAB — GC/CHLAMYDIA PROBE AMP (~~LOC~~) NOT AT ARMC
Chlamydia: NEGATIVE
Neisseria Gonorrhea: NEGATIVE

## 2016-01-08 ENCOUNTER — Other Ambulatory Visit: Payer: Self-pay | Admitting: Internal Medicine

## 2016-02-27 DIAGNOSIS — E119 Type 2 diabetes mellitus without complications: Secondary | ICD-10-CM

## 2016-02-29 ENCOUNTER — Telehealth: Payer: Self-pay | Admitting: Internal Medicine

## 2016-02-29 NOTE — Telephone Encounter (Deleted)
Dr Dellis Filbert office need notes from patient first appointment. Fax # [phone]

## 2016-02-29 NOTE — Telephone Encounter (Signed)
Dr Dellis Filbert office need notes from patient first appointment. Fax # [phone]

## 2016-03-04 ENCOUNTER — Inpatient Hospital Stay (HOSPITAL_COMMUNITY)
Admission: EM | Admit: 2016-03-04 | Discharge: 2016-03-06 | DRG: 639 | Disposition: A | Discharge status: Home / Self Care | Payer: Self-pay | Attending: Internal Medicine | Admitting: Internal Medicine

## 2016-03-04 ENCOUNTER — Ambulatory Visit (HOSPITAL_COMMUNITY)
Admission: EM | Admit: 2016-03-04 | Discharge: 2016-03-04 | Disposition: A | Discharge status: Nursing Home (Includes ICF) | Payer: Self-pay | Attending: Family Medicine | Admitting: Family Medicine

## 2016-03-04 ENCOUNTER — Encounter (HOSPITAL_COMMUNITY): Payer: Self-pay | Admitting: Emergency Medicine

## 2016-03-04 ENCOUNTER — Encounter (HOSPITAL_COMMUNITY): Payer: Self-pay | Admitting: *Deleted

## 2016-03-04 DIAGNOSIS — R5383 Other fatigue: Secondary | ICD-10-CM

## 2016-03-04 DIAGNOSIS — R739 Hyperglycemia, unspecified: Secondary | ICD-10-CM | POA: Diagnosis present

## 2016-03-04 DIAGNOSIS — Z79899 Other long term (current) drug therapy: Secondary | ICD-10-CM

## 2016-03-04 DIAGNOSIS — E1165 Type 2 diabetes mellitus with hyperglycemia: Secondary | ICD-10-CM

## 2016-03-04 DIAGNOSIS — R5381 Other malaise: Secondary | ICD-10-CM

## 2016-03-04 DIAGNOSIS — E876 Hypokalemia: Secondary | ICD-10-CM

## 2016-03-04 DIAGNOSIS — R7309 Other abnormal glucose: Secondary | ICD-10-CM

## 2016-03-04 DIAGNOSIS — G6289 Other specified polyneuropathies: Secondary | ICD-10-CM

## 2016-03-04 DIAGNOSIS — R531 Weakness: Secondary | ICD-10-CM

## 2016-03-04 DIAGNOSIS — E1310 Other specified diabetes mellitus with ketoacidosis without coma: Principal | ICD-10-CM | POA: Diagnosis present

## 2016-03-04 DIAGNOSIS — E118 Type 2 diabetes mellitus with unspecified complications: Secondary | ICD-10-CM

## 2016-03-04 DIAGNOSIS — E860 Dehydration: Secondary | ICD-10-CM

## 2016-03-04 DIAGNOSIS — Z794 Long term (current) use of insulin: Secondary | ICD-10-CM

## 2016-03-04 DIAGNOSIS — R824 Acetonuria: Secondary | ICD-10-CM

## 2016-03-04 LAB — CBC
HCT: 44 % (ref 36.0–46.0)
Hemoglobin: 14.3 g/dL (ref 12.0–15.0)
MCH: 29.5 pg (ref 26.0–34.0)
MCHC: 32.5 g/dL (ref 30.0–36.0)
MCV: 90.7 fL (ref 78.0–100.0)
Platelets: 290 10*3/uL (ref 150–400)
RBC: 4.85 MIL/uL (ref 3.87–5.11)
RDW: 13.3 % (ref 11.5–15.5)
WBC: 5.4 10*3/uL (ref 4.0–10.5)

## 2016-03-04 LAB — BASIC METABOLIC PANEL
Anion gap: 15 (ref 5–15)
Anion gap: 9 (ref 5–15)
BUN: 9 mg/dL (ref 6–20)
BUN: 8 mg/dL (ref 6–20)
CO2: 20 mmol/L — ABNORMAL LOW (ref 22–32)
CO2: 23 mmol/L (ref 22–32)
Calcium: 9.2 mg/dL (ref 8.9–10.3)
Calcium: 8.7 mg/dL — ABNORMAL LOW (ref 8.9–10.3)
Chloride: 101 mmol/L (ref 101–111)
Chloride: 107 mmol/L (ref 101–111)
Creatinine, Ser: 0.63 mg/dL (ref 0.44–1.00)
Creatinine, Ser: 0.48 mg/dL (ref 0.44–1.00)
GFR calc Af Amer: >60 mL/min (ref >60)
GFR calc Af Amer: >60 mL/min (ref >60)
GFR calc non Af Amer: >60 mL/min (ref >60)
GFR calc non Af Amer: >60 mL/min (ref >60)
Glucose, Bld: 382 mg/dL — ABNORMAL HIGH (ref 65–99)
Glucose, Bld: 121 mg/dL — ABNORMAL HIGH (ref 65–99)
Potassium: 3.7 mmol/L (ref 3.5–5.1)
Potassium: 3.1 mmol/L — ABNORMAL LOW (ref 3.5–5.1)
Sodium: 136 mmol/L (ref 135–145)
Sodium: 139 mmol/L (ref 135–145)

## 2016-03-04 LAB — POCT I-STAT, CHEM 8
BUN: 10 mg/dL (ref 6–20)
Calcium, Ion: 1.16 mmol/L (ref 1.13–1.30)
Chloride: 99 mmol/L — ABNORMAL LOW (ref 101–111)
Creatinine, Ser: 0.3 mg/dL — ABNORMAL LOW (ref 0.44–1.00)
Glucose, Bld: 398 mg/dL — ABNORMAL HIGH (ref 65–99)
HCT: 48 % — ABNORMAL HIGH (ref 36.0–46.0)
Hemoglobin: 16.3 g/dL — ABNORMAL HIGH (ref 12.0–15.0)
Potassium: 4 mmol/L (ref 3.5–5.1)
Sodium: 136 mmol/L (ref 135–145)
TCO2: 20 mmol/L (ref 0–100)

## 2016-03-04 LAB — URINALYSIS, ROUTINE W REFLEX MICROSCOPIC
Bilirubin Urine: NEGATIVE
Glucose, UA: >1000 mg/dL — AB
Ketones, ur: >80 mg/dL — AB
Leukocytes, UA: NEGATIVE
Nitrite: NEGATIVE
Protein, ur: NEGATIVE mg/dL
Specific Gravity, Urine: 1.046 — ABNORMAL HIGH (ref 1.005–1.030)
pH: 5 (ref 5.0–8.0)

## 2016-03-04 LAB — I-STAT BETA HCG BLOOD, ED (MC, WL, AP ONLY): I-stat hCG, quantitative: <5 m[IU]/mL (ref <5)

## 2016-03-04 LAB — URINE MICROSCOPIC-ADD ON: RBC / HPF: NONE SEEN RBC/hpf (ref 0–5)

## 2016-03-04 LAB — GLUCOSE, CAPILLARY
Glucose-Capillary: 104 mg/dL — ABNORMAL HIGH (ref 65–99)
Glucose-Capillary: 99 mg/dL (ref 65–99)
Glucose-Capillary: 348 mg/dL — ABNORMAL HIGH (ref 65–99)

## 2016-03-04 LAB — CBG MONITORING, ED
Glucose-Capillary: 369 mg/dL — ABNORMAL HIGH (ref 65–99)
Glucose-Capillary: 190 mg/dL — ABNORMAL HIGH (ref 65–99)

## 2016-03-04 LAB — LACTIC ACID, PLASMA: Lactic Acid, Venous: 0.9 mmol/L (ref 0.5–1.9)

## 2016-03-04 MED ORDER — ACETAMINOPHEN 325 MG PO TABS: 650.0000 mg | ORAL_TABLET | Freq: Four times a day (QID) | ORAL | Status: DC | PRN

## 2016-03-04 MED ORDER — ACETAMINOPHEN 650 MG RE SUPP: 650.0000 mg | Freq: Four times a day (QID) | RECTAL | Status: DC | PRN

## 2016-03-04 MED ORDER — OXYCODONE HCL 5 MG PO TABS: 5.0000 mg | ORAL_TABLET | ORAL | Status: DC | PRN

## 2016-03-04 MED ORDER — POTASSIUM CHLORIDE CRYS ER 20 MEQ PO TBCR
40.0000 meq | EXTENDED_RELEASE_TABLET | Freq: Every day | ORAL | Status: AC
  Administered 2016-03-04 – 2016-03-05 (×2): 40 meq via ORAL
  Filled 2016-03-04 (×2): qty 2

## 2016-03-04 MED ORDER — INSULIN ASPART 100 UNIT/ML ~~LOC~~ SOLN
0.0000 [IU] | Freq: Every day | SUBCUTANEOUS | Status: DC
  Administered 2016-03-04: 4 [IU] via SUBCUTANEOUS

## 2016-03-04 MED ORDER — ONDANSETRON HCL 4 MG/2ML IJ SOLN: 4.0000 mg | Freq: Four times a day (QID) | INTRAMUSCULAR | Status: DC | PRN

## 2016-03-04 MED ORDER — ONDANSETRON HCL 4 MG PO TABS: 4.0000 mg | ORAL_TABLET | Freq: Four times a day (QID) | ORAL | Status: DC | PRN

## 2016-03-04 MED ORDER — ENOXAPARIN SODIUM 40 MG/0.4ML ~~LOC~~ SOLN
40.0000 mg | SUBCUTANEOUS | Status: DC
  Administered 2016-03-04 – 2016-03-05 (×2): 40 mg via SUBCUTANEOUS
  Filled 2016-03-04 (×3): qty 0.4

## 2016-03-04 MED ORDER — INSULIN ASPART 100 UNIT/ML ~~LOC~~ SOLN
0.0000 [IU] | Freq: Three times a day (TID) | SUBCUTANEOUS | Status: DC
  Administered 2016-03-05: 3 [IU] via SUBCUTANEOUS

## 2016-03-04 MED ORDER — MAGNESIUM SULFATE 2 GM/50ML IV SOLN
2.0000 g | Freq: Once | INTRAVENOUS | Status: AC
  Administered 2016-03-04: 2 g via INTRAVENOUS
  Filled 2016-03-04: qty 50

## 2016-03-04 MED ORDER — INSULIN ASPART 100 UNIT/ML ~~LOC~~ SOLN
10.0000 [IU] | Freq: Once | SUBCUTANEOUS | Status: AC
  Administered 2016-03-04: 10 [IU] via SUBCUTANEOUS
  Filled 2016-03-04: qty 1

## 2016-03-04 MED ORDER — SODIUM CHLORIDE 0.9 % IV SOLN
INTRAVENOUS | Status: DC
  Administered 2016-03-04 – 2016-03-06 (×4): via INTRAVENOUS

## 2016-03-04 MED ORDER — SODIUM CHLORIDE 0.9 % IV BOLUS (SEPSIS)
1000.0000 mL | Freq: Once | INTRAVENOUS | Status: AC
  Administered 2016-03-04: 1000 mL via INTRAVENOUS

## 2016-03-04 NOTE — ED Provider Notes (Signed)
CSN: [id number]     Arrival date & time 03/04/16  1001 History   First MD Initiated Contact with Patient 03/04/16 1106     Chief Complaint  Patient presents with  . Hyperglycemia  . Vaginal Discharge   (Consider location/radiation/quality/duration/timing/severity/associated sxs/prior Treatment) 30 year old female is an immigrant from a [place] country who has been in [place] for 2 years and 8 months. She was diagnosed with diabetes several years ago. She apparently has seen a primary care provider in [place] wants however without insurance she was not able to return. At that visit she was taken off insulin and placed on up he will of which she does not know the name. Over time her blood sugars have continued to rise and has been under poor control. Lately she has not been able to test her blood sugars because her glucometer has broken. She complains of intermittent blurry vision, intermittent numbness and pain in her toes and feet although this is not a complaint of today. The report from the congregation on her includes blood pressure of 121 today and blood pressure 100/69 and pulse of 96. Patient has been having polyuria.      Past Medical History:  Diagnosis Date  . Diabetes mellitus without complication ([place])    History reviewed. No pertinent surgical history. Family History  Problem Relation Age of Onset  . Diabetes Father    Social History  Substance Use Topics  . Smoking status: Never Smoker  . Smokeless tobacco: Never Used  . Alcohol use No   OB History    Gravida Para Term Preterm AB Living   0             SAB TAB Ectopic Multiple Live Births                 Review of Systems  Constitutional: Positive for activity change and unexpected weight change. Negative for fever.  HENT: Negative.   Eyes: Positive for visual disturbance.  Respiratory: Negative.  Negative for cough and choking.   Cardiovascular: Negative for chest pain, palpitations and leg  swelling.  Gastrointestinal: Negative.   Endocrine: Positive for polyuria.  Genitourinary: Negative for difficulty urinating and menstrual problem.  Musculoskeletal: Negative.   Skin: Negative.   Neurological: Positive for light-headedness.  Hematological: Negative.   All other systems reviewed and are negative.   Allergies  Review of patient's allergies indicates no known allergies.  Home Medications   Prior to Admission medications   Medication Sig Start Date End Date Taking? Authorizing Provider  metFORMIN (GLUCOPHAGE) 500 MG tablet TAKE 1 TABLET (500 MG TOTAL) BY MOUTH 2 (TWO) TIMES DAILY WITH A MEAL. 01/08/16  Yes Philemon Kingdom, MD  Biotin w/ Vitamins C & E (HAIR/SKIN/NAILS PO) Take 1 tablet by mouth daily as needed.    Historical Provider, MD  fluconazole (DIFLUCAN) 150 MG tablet Take 1 tablet (150 mg total) by mouth once. 12/02/15   Hyman Bible, PA-C  folic acid (FOLVITE) A999333 MCG tablet Take 400 mcg by mouth daily.    Historical Provider, MD  insulin NPH Human (HUMULIN N) 100 UNIT/ML injection Inject 15 units in am and 10 units at bedtime - pens please. Patient not taking: Reported on 12/01/2015 09/07/15   Philemon Kingdom, MD  Insulin Pen Needle (CAREFINE PEN NEEDLES) 32G X 4 MM MISC Use 2x  aday 07/05/15   Philemon Kingdom, MD   Meds Ordered and Administered this Visit  Medications - No data to display  BP 123/78 (BP  Location: Left Arm)   Pulse 86   Temp 98.7 F (37.1 C) (Oral)   LMP 02/28/2016 (Exact Date)   SpO2 100%  No data found.   Physical Exam  Constitutional: She is oriented to person, place, and time. She appears well-developed and well-nourished. No distress.  HENT:  Head: Normocephalic and atraumatic.  Eyes: EOM are normal.  Neck: Normal range of motion. Neck supple.  Cardiovascular: Normal rate, regular rhythm and normal heart sounds.   Pulmonary/Chest: Effort normal and breath sounds normal. No respiratory distress.  Abdominal: Soft. There is no  tenderness.  Neurological: She is alert and oriented to person, place, and time. She exhibits normal muscle tone.  Skin: Skin is warm and dry.  Psychiatric: She has a normal mood and affect.  Nursing note and vitals reviewed.   Urgent Care Course   Clinical Course    Procedures (including critical care time)  Labs Review Labs Reviewed  POCT I-STAT, CHEM 8 - Abnormal; Notable for the following:       Result Value   Chloride 99 (*)    Creatinine, Ser 0.30 (*)    Glucose, Bld 398 (*)    Hemoglobin 16.3 (*)    HCT 48.0 (*)    All other components within normal limits   Results for orders placed or performed during the hospital encounter of 03/04/16  I-STAT, chem 8  Result Value Ref Range   Sodium 136 135 - 145 mmol/L   Potassium 4.0 3.5 - 5.1 mmol/L   Chloride 99 (L) 101 - 111 mmol/L   BUN 10 6 - 20 mg/dL   Creatinine, Ser 0.30 (L) 0.44 - 1.00 mg/dL   Glucose, Bld 398 (H) 65 - 99 mg/dL   Calcium, Ion 1.16 1.13 - 1.30 mmol/L   TCO2 20 0 - 100 mmol/L   Hemoglobin 16.3 (H) 12.0 - 15.0 g/dL   HCT 48.0 (H) 36.0 - 46.0 %     Imaging Review No results found.   Visual Acuity Review  Right Eye Distance:   Left Eye Distance:   Bilateral Distance:    Right Eye Near:   Left Eye Near:    Bilateral Near:         MDM   1. Type 2 diabetes mellitus with hyperglycemia, without long-term current use of insulin (HCC)   2. Dehydration   3. Other polyneuropathy ([place])   30 year old Arabic female with history of type 2 diabetes mellitus with very little primary care since her arrival to [place] 2 years and 8 months ago. Is unsure as to whether she is taking one of her "little pills are not. She does believe that her blood sugars have been increasing and out of control. She is having occasional blurred vision, burning and numbness of the toes and feet, feeling weak and fatigued and blood sugars today around 400 or higher. She states it will be a long time before she would be  able to see a primary care provider. She will likely need IV fluids and management for hyperglycemia. Potassium is 4.0. She is being transferred via shuttle to the emergency department.     Janne Napoleon, NP 03/04/16 1133

## 2016-03-04 NOTE — ED Notes (Signed)
Pt made aware of bed assignment 

## 2016-03-04 NOTE — ED Triage Notes (Signed)
The patient presented to the [facility] with multiple complaints. The patient was evaluated by the [facility] RN and referred to the [facility] . The patient had a complaint of consistently elevated CBG and requested it to be checked. The patient has no home glucometer and no PCP according to the RN. The patient also reported vaginal itching and a discharge.

## 2016-03-04 NOTE — Progress Notes (Signed)
Follow up appointment made to establish primary care with the [facility] for Friday October 6,2017 @ 11:00am. Attempt made to obtain an earlier appointment with [facility] but unsuccessful.[place] card application also provided. Information provided to patients nurse to give with discharge instructions. My contact information also provided for any future questions or concerns.  [place] Specialist [id number] [phone]

## 2016-03-04 NOTE — ED Provider Notes (Signed)
[facility] DEPT Provider Note   CSN: [id number] Arrival date & time: 03/04/16  1247     History   Chief Complaint Chief Complaint  Patient presents with  . Hyperglycemia    HPI Tina Brown is a 30 y.o. female.  HPI History of DM (untreated, no insurance, no PCP) who presents from [facility] for hyperglycemia.  She had a routine check at school from a nurse who noticed her BGL high, and sent her here.  She endorses increased thirst and increased urination.  She takes a medicine BID for DM but doesn't know what it is called (record says metformin).  She is currently not on insulin.     Past Medical History:  Diagnosis Date  . Diabetes mellitus without complication [facility])     Patient Active Problem List   Diagnosis Date Noted  . Diabetes mellitus without complication ([place]) [id number]    History reviewed. No pertinent surgical history.  OB History    Gravida Para Term Preterm AB Living   0             SAB TAB Ectopic Multiple Live Births                   Home Medications    Prior to Admission medications   Medication Sig Start Date End Date Taking? Authorizing Provider  Biotin w/ Vitamins C & E (HAIR/SKIN/NAILS PO) Take 1 tablet by mouth daily as needed.    Historical Provider, MD  fluconazole (DIFLUCAN) 150 MG tablet Take 1 tablet (150 mg total) by mouth once. 12/02/15   Hyman Bible, PA-C  folic acid (FOLVITE) A999333 MCG tablet Take 400 mcg by mouth daily.    Historical Provider, MD  insulin NPH Human (HUMULIN N) 100 UNIT/ML injection Inject 15 units in am and 10 units at bedtime - pens please. Patient not taking: Reported on 12/01/2015 09/07/15   Philemon Kingdom, MD  Insulin Pen Needle (CAREFINE PEN NEEDLES) 32G X 4 MM MISC Use 2x  aday 07/05/15   Philemon Kingdom, MD  metFORMIN (GLUCOPHAGE) 500 MG tablet TAKE 1 TABLET (500 MG TOTAL) BY MOUTH 2 (TWO) TIMES DAILY WITH A MEAL. 01/08/16   Philemon Kingdom, MD    Family History Family History  Problem Relation Age of  Onset  . Diabetes Father     Social History Social History  Substance Use Topics  . Smoking status: Never Smoker  . Smokeless tobacco: Never Used  . Alcohol use No     Allergies   Review of patient's allergies indicates no known allergies.   Review of Systems Review of Systems  Constitutional: Negative for chills and fever.  HENT: Negative for ear pain and sore throat.   Eyes: Positive for visual disturbance (blurred). Negative for pain.  Respiratory: Negative for cough and shortness of breath.   Cardiovascular: Negative for chest pain and palpitations.  Gastrointestinal: Negative for abdominal pain and vomiting.  Endocrine: Positive for polydipsia and polyuria.  Genitourinary: Negative for dysuria and hematuria.  Musculoskeletal: Negative for arthralgias and back pain.  Skin: Negative for color change and rash.  Neurological: Negative for seizures and syncope.  All other systems reviewed and are negative.    Physical Exam Updated Vital Signs BP 110/64   Pulse 85   Temp 98.2 F (36.8 C) (Oral)   Resp 16   Wt 61 kg   LMP 02/28/2016 (Exact Date)   SpO2 99%   BMI 21.71 kg/m   Physical Exam  Constitutional: She appears well-developed  and well-nourished. No distress.  HENT:  Head: Normocephalic and atraumatic.  Eyes: Conjunctivae are normal.  Acuity: Ou: 20/20 Od: 20/20 Os: 20/20  Fundoscopic: R: No papilledema, no cell and flare, no hyphema L: No papilledema,  no cell and flare, no hyphema Visual fields intact bilaterally   Neck: Neck supple.  Cardiovascular: Normal rate and regular rhythm.   No murmur heard. Pulmonary/Chest: Effort normal and breath sounds normal. No respiratory distress.  Abdominal: Soft. There is no tenderness.  Musculoskeletal: She exhibits no edema.  Neurological: She is alert.  Skin: Skin is warm and dry.  Psychiatric: She has a normal mood and affect.  Nursing note and vitals reviewed.    ED Treatments / Results   Labs (all labs ordered are listed, but only abnormal results are displayed) Labs Reviewed  BASIC METABOLIC PANEL - Abnormal; Notable for the following:       Result Value   CO2 20 (*)    Glucose, Bld 382 (*)    All other components within normal limits  CBG MONITORING, ED - Abnormal; Notable for the following:    Glucose-Capillary 369 (*)    All other components within normal limits  CBC  URINALYSIS, ROUTINE W REFLEX MICROSCOPIC (NOT AT [facility])  I-STAT BETA HCG BLOOD, ED ([facility], [facility], [facility] ONLY)    EKG  EKG Interpretation None       Radiology No results found.  Procedures Procedures (including critical care time)  Medications Ordered in ED Medications - No data to display   Initial Impression / Assessment and Plan / ED Course  I have reviewed the triage vital signs and the nursing notes.  Pertinent labs & imaging results that were available during my care of the patient were reviewed by me and considered in my medical decision making (see chart for details).  Clinical Course    Patient presents for hyperglycemia, from clinic.  She has had poor control.  BGL elevated here, mild acidosis with mild AG (15).  UA with >80 ketones.  Patient hydrated and given insulin.  Suspect dehydration with possibly beginnings of DKA.  Regarding visual symptoms, she has normal exam here and I doubt emergent conditions of the eye.  She has poor f/u.  CM saw patient and was able to secure f/u in October.  Will admit for continued hydration.     Final Clinical Impressions(s) / ED Diagnoses   Final diagnoses:  None    New Prescriptions New Prescriptions   No medications on file     Levada Schilling, MD 03/04/16 1559    Davonna Belling, MD 03/05/16 [phone]

## 2016-03-04 NOTE — ED Notes (Signed)
Pt states, "I have diabetes & take a pill in the morning & at night. I went to see my nurse at school because I cannot check my blood sugar at home because I do not have a machine. She told me to go to Urgent care for high blood sugar & than I came here."

## 2016-03-04 NOTE — ED Triage Notes (Signed)
Pt was sent to ucc by [place] congregational nurse. Pt has hx of diabetes but no glucometer at home. Reports blurry vision and nausea. Sent here due to hyperglycemia. No acute distress noted at triage. Used translator ID [id number].

## 2016-03-04 NOTE — ED Notes (Signed)
CBG 190

## 2016-03-04 NOTE — H&P (Addendum)
History and Physical    Tina Brown [id number] DOB: [birth year] DOA: 03/04/2016  PCP: Jenny Reichmann, MD Patient coming from: Home  Chief Complaint: generalized weakness  HPI: Tina Brown is a 30 y.o. female with medical history significant of diabetes. Patient tolerated by Arabic interpreter. 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 Patient states that she has not followed up with a physician or taken her insulin for several months due to loss of insurance and cost of appointments and medicine. Patient also states that her glucose meter is now broken. One millimeter was working glucose was ranging in the 2-300 range. Patient endorses several day history of polydipsia and polyuria with generalized weakness. Symptoms are fairly constant and getting worse. Nothing makes her symptoms better. Patient denies any dysuria, back pain, fevers, chest pain, shortness of breath, palpitations, nausea, vomiting, diarrhea, neck stiffness, headache. Patient has been compliant with her metformin.   ED Course: objective findings below. Given IVF and Insulin  Review of Systems: As per HPI otherwise 10 point review of systems negative.   Ambulatory Status:no restrictions  Past Medical History:  Diagnosis Date  . Diabetes mellitus without complication ([place])     History reviewed. No pertinent surgical history.  Social History   Social History  . Marital status: Married    Spouse name: N/A  . Number of children: N/A  . Years of education: N/A   Occupational History  . Not on file.   Social History Main Topics  . Smoking status: Never Smoker  . Smokeless tobacco: Never Used  . Alcohol use No  . Drug use: No  . Sexual activity: Not on file   Other Topics Concern  . Not on file   Social History Narrative  . No narrative on file    No Known Allergies  Family History  Problem Relation Age of Onset  . Diabetes Father      Prior to Admission medications   Medication Sig Start Date End Date Taking? Authorizing Provider  Insulin NPH Human, Isophane, (HUMULIN N KWIKPEN Carmine) Inject 10-15 Units into the skin 2 (two) times daily. 15 in the morning and 10 units at bedtime   Yes Historical Provider, MD  Insulin Pen Needle (CAREFINE PEN NEEDLES) 32G X 4 MM MISC Use 2x  aday 07/05/15  Yes Philemon Kingdom, MD  metFORMIN (GLUCOPHAGE) 500 MG tablet TAKE 1 TABLET (500 MG TOTAL) BY MOUTH 2 (TWO) TIMES DAILY WITH A MEAL. 01/08/16  Yes Philemon Kingdom, MD  Biotin w/ Vitamins C & E (HAIR/SKIN/NAILS PO) Take 1 tablet by mouth daily as needed.    Historical Provider, MD  fluconazole (DIFLUCAN) 150 MG tablet Take 1 tablet (150 mg total) by mouth once. 12/02/15   Hyman Bible, PA-C  folic acid (FOLVITE) [id number] MCG tablet Take 400 mcg by mouth daily.    Historical Provider, MD  insulin NPH Human (HUMULIN N) 100 UNIT/ML injection Inject 15 units in am and 10 units at bedtime - pens please. Patient not taking: Reported on 12/01/2015 09/07/15   Philemon Kingdom, MD    Physical Exam: Vitals:   03/04/16 1515 03/04/16 1530 03/04/16 1600 03/04/16 1615  BP: 108/74 92/78 105/71 114/82  Pulse: 82 93 85 84  Resp:      Temp:      TempSrc:      SpO2: 100% 100% 100% 100%  Weight:         General:  Appears calm and comfortable Eyes:  PERRL, EOMI, normal lids, iris ENT:  grossly normal hearing, lips & tongue, mmm Neck:  no LAD, masses or thyromegaly Cardiovascular:  RRR, no m/r/g. No LE edema.  Respiratory:  CTA bilaterally, no w/r/r. Normal respiratory effort. Abdomen:  soft, ntnd, NABS Skin:  no rash or induration seen on limited exam Musculoskeletal:  grossly normal tone BUE/BLE, good ROM, no bony abnormality Psychiatric:  grossly normal mood and affect, speech fluent and appropriate, AOx3 Neurologic:  CN 2-12 grossly intact, moves all extremities in coordinated fashion, sensation intact  Labs on Admission: I have personally  reviewed following labs and imaging studies  CBC:  Recent Labs Lab 03/04/16 1058 03/04/16 1204  WBC  --  5.4  HGB 16.3* 14.3  HCT 48.0* 44.0  MCV  --  90.7  PLT  --  [id number]   Basic Metabolic Panel:  Recent Labs Lab 03/04/16 1058 03/04/16 1204 03/04/16 1645  NA 136 136 139  K 4.0 3.7 3.1*  CL 99* 101 107  CO2  --  20* 23  GLUCOSE 398* 382* 121*  BUN 10 9 8   CREATININE 0.30* 0.63 0.48  CALCIUM  --  9.2 8.7*   GFR: CrCl cannot be calculated (Unknown ideal weight.). Liver Function Tests: No results for input(s): AST, ALT, ALKPHOS, BILITOT, PROT, ALBUMIN in the last 168 hours. No results for input(s): LIPASE, AMYLASE in the last 168 hours. No results for input(s): AMMONIA in the last 168 hours. Coagulation Profile: No results for input(s): INR, PROTIME in the last 168 hours. Cardiac Enzymes: No results for input(s): CKTOTAL, CKMB, CKMBINDEX, TROPONINI in the last 168 hours. BNP (last 3 results) No results for input(s): PROBNP in the last 8760 hours. HbA1C: No results for input(s): HGBA1C in the last 72 hours. CBG:  Recent Labs Lab 03/04/16 1204 03/04/16 1556  GLUCAP 369* 190*   Lipid Profile: No results for input(s): CHOL, HDL, LDLCALC, TRIG, CHOLHDL, LDLDIRECT in the last 72 hours. Thyroid Function Tests: No results for input(s): TSH, T4TOTAL, FREET4, T3FREE, THYROIDAB in the last 72 hours. Anemia Panel: No results for input(s): VITAMINB12, FOLATE, FERRITIN, TIBC, IRON, RETICCTPCT in the last 72 hours. Urine analysis:    Component Value Date/Time   COLORURINE YELLOW 03/04/2016 1254   APPEARANCEUR CLEAR 03/04/2016 1254   LABSPEC 1.046 (H) 03/04/2016 1254   PHURINE 5.0 03/04/2016 1254   GLUCOSEU >1000 (A) 03/04/2016 1254   HGBUR SMALL (A) 03/04/2016 1254   BILIRUBINUR NEGATIVE 03/04/2016 1254   KETONESUR >80 (A) 03/04/2016 1254   PROTEINUR NEGATIVE 03/04/2016 1254   UROBILINOGEN 0.2 12/03/2013 2150   NITRITE NEGATIVE 03/04/2016 1254   LEUKOCYTESUR  NEGATIVE 03/04/2016 1254    Creatinine Clearance: CrCl cannot be calculated (Unknown ideal weight.).  Sepsis Labs: @LABRCNTIP (procalcitonin:4,lacticidven:4) )No results found for this or any previous visit (from the past 240 hour(s)).   Radiological Exams on Admission: No results found.   Assessment/Plan Active Problems:   Hyperglycemia   Diabetes mellitus with complication (HCC)   Ketonuria   Generalized weakness   DM/Hyperglycemia: Anion gap 15 w/ low bicarb of 20 and ketonuria, w/ generalized weakness, polyruia and polydypsia. No primary care f/u and no insulin at home. Pt likely in early stages of DKA. Reasonable for DC if able to have close f/u and medications.  1L NS bolus and 10 units novolog given in ED w/ correction of glucose from 386-196.  - IVF - SSI - BMP Q12 - DIABETES EDUCATION - needs diabetic medications she can afford and close follow up -  Case management and Social work (pt could reasonably go back to [facility] as she has gone there before for both urgent and primary care).  - Resume metformin at time of DC.  - A1c - Serum Ketone  Hypokalemia: 3.1 after correction of Hyperglycemia likely from early acidotic state.  - Mag, Kdur  Generalized weakness: likely from progressive ongoing hyperglycemic state.  - ambulate pt  - treatment as above.    DVT prophylaxis: Lovenox  Code Status: full  Family Communication: none  Disposition Plan: pendingimprovmenet   Consults called: none  Admission status: observation    Opal Dinning J MD [facility]  If 7PM-7AM, please contact night-coverage [email or web address] Password [facility]  03/04/2016, 5:33 PM

## 2016-03-05 DIAGNOSIS — E118 Type 2 diabetes mellitus with unspecified complications: Secondary | ICD-10-CM

## 2016-03-05 DIAGNOSIS — E876 Hypokalemia: Secondary | ICD-10-CM

## 2016-03-05 LAB — GLUCOSE, CAPILLARY
Glucose-Capillary: 247 mg/dL — ABNORMAL HIGH (ref 65–99)
Glucose-Capillary: 419 mg/dL — ABNORMAL HIGH (ref 65–99)
Glucose-Capillary: 421 mg/dL — ABNORMAL HIGH (ref 65–99)
Glucose-Capillary: 334 mg/dL — ABNORMAL HIGH (ref 65–99)
Glucose-Capillary: 220 mg/dL — ABNORMAL HIGH (ref 65–99)
Glucose-Capillary: 223 mg/dL — ABNORMAL HIGH (ref 65–99)

## 2016-03-05 LAB — CBC
HCT: 39.3 % (ref 36.0–46.0)
Hemoglobin: 12.4 g/dL (ref 12.0–15.0)
MCH: 28.8 pg (ref 26.0–34.0)
MCHC: 31.6 g/dL (ref 30.0–36.0)
MCV: 91.2 fL (ref 78.0–100.0)
Platelets: 234 10*3/uL (ref 150–400)
RBC: 4.31 MIL/uL (ref 3.87–5.11)
RDW: 13.4 % (ref 11.5–15.5)
WBC: 4.9 10*3/uL (ref 4.0–10.5)

## 2016-03-05 LAB — BASIC METABOLIC PANEL
Anion gap: 9 (ref 5–15)
BUN: 8 mg/dL (ref 6–20)
CO2: 21 mmol/L — ABNORMAL LOW (ref 22–32)
Calcium: 8 mg/dL — ABNORMAL LOW (ref 8.9–10.3)
Chloride: 108 mmol/L (ref 101–111)
Creatinine, Ser: 0.54 mg/dL (ref 0.44–1.00)
GFR calc Af Amer: >60 mL/min (ref >60)
GFR calc non Af Amer: >60 mL/min (ref >60)
Glucose, Bld: 187 mg/dL — ABNORMAL HIGH (ref 65–99)
Potassium: 3.6 mmol/L (ref 3.5–5.1)
Sodium: 138 mmol/L (ref 135–145)

## 2016-03-05 MED ORDER — METFORMIN HCL 1000 MG PO TABS: 1000.0000 mg | ORAL_TABLET | Freq: Two times a day (BID) | ORAL | 1 refills | Status: DC

## 2016-03-05 MED ORDER — INSULIN NPH ISOPHANE & REGULAR (70-30) 100 UNIT/ML ~~LOC~~ SUSP
13.0000 [IU] | Freq: Two times a day (BID) | SUBCUTANEOUS | 11 refills | Status: DC
Start: 2016-03-05 — End: 2016-03-06

## 2016-03-05 MED ORDER — INSULIN ASPART 100 UNIT/ML ~~LOC~~ SOLN
5.0000 [IU] | Freq: Once | SUBCUTANEOUS | Status: AC
  Administered 2016-03-05: 5 [IU] via SUBCUTANEOUS

## 2016-03-05 MED ORDER — INSULIN ASPART PROT & ASPART (70-30 MIX) 100 UNIT/ML ~~LOC~~ SUSP
15.0000 [IU] | Freq: Two times a day (BID) | SUBCUTANEOUS | Status: DC
  Administered 2016-03-05: 15 [IU] via SUBCUTANEOUS
  Filled 2016-03-05: qty 10

## 2016-03-05 MED ORDER — POTASSIUM CHLORIDE CRYS ER 20 MEQ PO TBCR
40.0000 meq | EXTENDED_RELEASE_TABLET | Freq: Once | ORAL | Status: AC
  Administered 2016-03-05: 40 meq via ORAL
  Filled 2016-03-05: qty 2

## 2016-03-05 MED ORDER — METFORMIN HCL 500 MG PO TABS
1000.0000 mg | ORAL_TABLET | Freq: Two times a day (BID) | ORAL | Status: DC
  Administered 2016-03-05 – 2016-03-06 (×3): 1000 mg via ORAL
  Filled 2016-03-05 (×3): qty 2

## 2016-03-05 MED ORDER — INSULIN ASPART PROT & ASPART (70-30 MIX) 100 UNIT/ML ~~LOC~~ SUSP
17.0000 [IU] | Freq: Two times a day (BID) | SUBCUTANEOUS | Status: DC
  Administered 2016-03-05 – 2016-03-06 (×2): 17 [IU] via SUBCUTANEOUS
  Filled 2016-03-05: qty 10

## 2016-03-05 MED ORDER — INSULIN ASPART PROT & ASPART (70-30 MIX) 100 UNIT/ML ~~LOC~~ SUSP: 17.0000 [IU] | Freq: Two times a day (BID) | SUBCUTANEOUS | Status: DC

## 2016-03-05 MED ORDER — "INSULIN SYRINGE 27G X 1/2"" 0.5 ML MISC": 1.0000 | Freq: Two times a day (BID) | 3 refills | Status: DC

## 2016-03-05 MED ORDER — INSULIN ASPART PROT & ASPART (70-30 MIX) 100 UNIT/ML ~~LOC~~ SUSP: 15.0000 [IU] | Freq: Two times a day (BID) | SUBCUTANEOUS | 11 refills | Status: DC

## 2016-03-05 NOTE — Progress Notes (Signed)
Patient CBG 223, no insulin coverage at this time. On-call NP Baltazar Najjar notified.  Will continue to monitor Patient and notify NP as needed.

## 2016-03-05 NOTE — Progress Notes (Signed)
Used translation line (Arabic) to speak with patient about diabetes and importance of diabetes control. Patient reports that she does not currently have a PCP due to lack of insurance but states that she will have insurance in November and will try to find a PCP. Explained to the patient that it was important that she establish care with a primary doctor before November. Informed patient that there is a consult for CM to assist with follow up care.  Patient is not checking her glucose at home because her glucometer is broken. Informed patient she could purchase a Reli-On glucometer at [facility] for $9 and a box of 50 test strips for $9.   Inquired about prior A1C and patient reports that she does not know that an A1C is.  Discussed last A1C results in chart (14.2% on 07/05/15) and explained what an A1C is. Discussed glucose and A1C goals. Discussed importance of checking CBGs and maintaining good CBG control to prevent long-term and short-term complications. Explained how hyperglycemia leads to damage within blood vessels which lead to the common complications seen with uncontrolled diabetes. Stressed to the patient the importance of improving glycemic control to prevent further complications from uncontrolled diabetes. Provided patient with handout of affordable Reli-On products at [facility]. Explained to patient that she will be prescribed 70/30 insulin at time of discharge and which can be purchased at [facility] for $24.88 per vial. Patient reports that she has used both insulin pens and insulin vial/syringe in the past. Explained that the Novolin 70/30 at [facility] is in a vial/syringe.  Inquired if patient would be able to afford a new Reli-On glucometer, test strips, Novolin 70/30, and insulin syringes and patient stated "Yes." Encouraged patient to check her glucose 4 times per day (before meals and at bedtime) and to keep a log book of glucose readings and insulin taken which she will need to take to doctor  appointments. Explained how the doctor she follows up with can use the log book to continue to make insulin adjustments if needed. Patient verbalized understanding of information discussed and she states that she has no further questions at this time related to diabetes.  Thanks, Barnie Alderman, RN, MSN, CDE Diabetes Coordinator Inpatient Diabetes Program [phone] (Team Pager) [phone] (AP office) [phone] [facility] office) [phone] [facility] office)

## 2016-03-05 NOTE — Progress Notes (Signed)
Inpatient Diabetes Program Recommendations  AACE/ADA: New Consensus Statement on Inpatient Glycemic Control (2015)  Target Ranges:  Prepandial:   less than 140 mg/dL      Peak postprandial:   less than 180 mg/dL (1-2 hours)      Critically ill patients:  140 - 180 mg/dL   Review of Glycemic Control Inpatient Diabetes Program Recommendations:  Spoke with Dr. [facility] regarding insulin regimen. Since patient does not have insurance and placing on 70/30 insulin, consider prescription of 70/30 insulin ac breakfast and dinner 13 units bid (which will provide patient 18 units basal and 8 units meal coverage). Patient can get the 70/30 Novolin insulin vial with syringes #[id number] and Relion meter kit from [employer] (includes lancets & strips) [id number].  Please change diet to carb modified. Noted A1c in process.  Thank you, Nani Gasser. Alencia Gordon, RN, MSN, CDE Inpatient Glycemic Control Team Team Pager [phone] (8am-5pm) 03/05/2016 10:06 AM

## 2016-03-05 NOTE — Progress Notes (Signed)
PROGRESS NOTE                                                                                                                                                                                                             Patient Demographics:    Tina Brown, is a 30 y.o. female, DOB - Nov 04, 1985, [id number]  Admit date - 03/04/2016   Admitting Physician Waldemar Dickens, MD  Outpatient Primary MD for the patient is DAUB, Lina Sayre, MD  LOS - 0   Chief Complaint  Patient presents with  . Hyperglycemia       Brief Narrative   30 year old female with history of diabetes, continue to take her metformin, but can't afford her insulin, presents with hyperglycemia, DKA.   Subjective:    Tina Brown today has, No headache, No chest pain, No abdominal pain - No Nausea, No new weakness tingling or numbness, No Cough - SOB.    Assessment  & Plan :    Active Problems:   Hyperglycemia   Diabetes mellitus with complication (HCC)   Ketonuria   Generalized weakness   Hypokalemia    DM/Hyperglycemia: -  Anion gap 15 w/ low bicarb of 20 and ketonuria, w/ generalized weakness, polyruia and polydypsia. No primary care f/u and no insulin at home. Pt likely in early stages of DKA. She was admitted, started on IV fluids, insulin sliding scale, good CBG control, patient was counseled about importance of medication compliance, she did continue with her metformin, she stopped taking her insulin NPH for a few months now as she could not afford it often she ran out of the samples she was given by Dr.Gherge. - we will increaseher metformin 1000 mg oral twice a day, which is available at [facility] $4 medication list. - Will start on Novolin 70/30 ,13 units twice a day, which is available OTC at $24 per while at [facility], CBG remains uncontrolled, but he likely will need to increase dose by evening, continue to monitor closely - [facility] - She was instructed to purchase over-the-counter glucometer with strips and lancets, which she reports she can afford.  Hypokalemia:  - Repleted, recheck in a.m.  Code Status : Full  Family Communication  : family member at bedside  Disposition Plan  : Home :  Consults  :  None  Procedures  : none  DVT Prophylaxis  :  Lovenox   Lab Results  Component Value Date   PLT 234 03/05/2016    Antibiotics  :   Anti-infectives    None        Objective:   Vitals:   03/04/16 1744 03/04/16 2155 03/05/16 0432 03/05/16 1330  BP: (!) 102/53 105/64 100/61 97/73  Pulse: (!) 102 90 73 93  Resp: 20 18 18 18   Temp: 98.4 F (36.9 C) 98.3 F (36.8 C) 98.2 F (36.8 C) 98.3 F (36.8 C)  TempSrc: Oral Oral  Oral  SpO2: 99% 100% 100% 99%  Weight: 61.6 kg (135 lb 11.2 oz)  62.6 kg (138 lb 1.6 oz)     Wt Readings from Last 3 Encounters:  03/05/16 62.6 kg (138 lb 1.6 oz)  07/05/15 62.6 kg (138 lb)  04/08/14 64.8 kg (142 lb 12.8 oz)     Intake/Output Summary (Last 24 hours) at 03/05/16 1716 Last data filed at 03/05/16 1330  Gross per 24 hour  Intake          1595.58 ml  Output             2350 ml  Net          -754.42 ml     Physical Exam  Awake Alert, Oriented X 3, No new F.N deficits, Normal affect [place].AT,PERRAL Supple Neck,No JVD, No cervical lymphadenopathy appriciated.  Symmetrical Chest wall movement, Good air movement bilaterally, CTAB RRR,No Gallops,Rubs or new Murmurs, No Parasternal Heave +ve B.Sounds, Abd Soft, No tenderness, No organomegaly appriciated, No rebound - guarding or rigidity. No Cyanosis, Clubbing or edema, No new Rash or bruise      Data Review:    CBC  Recent Labs Lab 03/04/16 1058 03/04/16 1204 03/05/16 0520  WBC  --  5.4 4.9  HGB 16.3* 14.3 12.4  HCT 48.0* 44.0 39.3  PLT  --  290 234  MCV  --  90.7 91.2  MCH  --  29.5 28.8  MCHC  --  32.5 31.6  RDW  --  13.3 13.4    Chemistries   Recent Labs Lab 03/04/16 1058  03/04/16 1204 03/04/16 1645 03/05/16 0520  NA 136 136 139 138  K 4.0 3.7 3.1* 3.6  CL 99* 101 107 108  CO2  --  20* 23 21*  GLUCOSE 398* 382* 121* 187*  BUN 10 9 8 8   CREATININE 0.30* 0.63 0.48 0.54  CALCIUM  --  9.2 8.7* 8.0*   ------------------------------------------------------------------------------------------------------------------ No results for input(s): CHOL, HDL, LDLCALC, TRIG, CHOLHDL, LDLDIRECT in the last 72 hours.  Lab Results  Component Value Date   HGBA1C 14.2 07/05/2015   ------------------------------------------------------------------------------------------------------------------ No results for input(s): TSH, T4TOTAL, T3FREE, THYROIDAB in the last 72 hours.  Invalid input(s): FREET3 ------------------------------------------------------------------------------------------------------------------ No results for input(s): VITAMINB12, FOLATE, FERRITIN, TIBC, IRON, RETICCTPCT in the last 72 hours.  Coagulation profile No results for input(s): INR, PROTIME in the last 168 hours.  No results for input(s): DDIMER in the last 72 hours.  Cardiac Enzymes No results for input(s): CKMB, TROPONINI, MYOGLOBIN in the last 168 hours.  Invalid input(s): CK ------------------------------------------------------------------------------------------------------------------ No results found for: BNP  Inpatient Medications  Scheduled Meds: . enoxaparin (LOVENOX) injection  40 mg Subcutaneous Q24H  . insulin aspart  0-5 Units Subcutaneous QHS  . insulin aspart  0-9 Units Subcutaneous TID WC  . insulin aspart protamine- aspart  15 Units Subcutaneous BID WC  .  metFORMIN  1,000 mg Oral BID WC   Continuous Infusions: . sodium chloride Stopped (03/05/16 1231)   PRN Meds:.acetaminophen **OR** acetaminophen, ondansetron **OR** ondansetron (ZOFRAN) IV, oxyCODONE  Micro Results No results found for this or any previous visit (from the past 240 hour(s)).  Radiology  Reports No results found.    [facility], Evangelina Delancey M.D on 03/05/2016 at 5:16 PM  Between 7am to 7pm - Pager - [phone]  After 7pm go to [email or web address] - password [facility]  [facility] -  Office  [phone]

## 2016-03-05 NOTE — Care Management Note (Signed)
Case Management Note  Patient Details  Name: Tina Brown MRN: [id number] Date of Birth: [birth year]  Subjective/Objective:         Admitted with hyperglycemia.       Action/Plan: Plan to d/c to home with husband assuming care for self today. Pt is to call [facility] for post hospital visit appointment @ [phone] on August 28th. CM provided pt with [facility] brochure.   Expected Discharge Date:   03/05/2016           Expected Discharge Plan:  Home/Self Care  In-House Referral:     Discharge [facility], CM Consult  Post Acute Care Choice:    Choice offered to:     DME Arranged:    DME Agency:     HH Arranged:    HH Agency:     Status of Service:  Completed, signed off  If discussed at [employer], dates discussed:    Additional Comments:  Sharin Mons, RN 03/05/2016, 12:19 PM

## 2016-03-05 NOTE — Progress Notes (Signed)
Late entry, Pt. in room at this time.alert and oriented x4 moe x4 pt demonstrated proper use of call bell.Husband to arrive later.

## 2016-03-05 NOTE — Progress Notes (Addendum)
Pt.in room alert and oriented x4 moe x4  Speaks some english, husband to come later for completion of admission questions. No c/o pain no skin breakdown noted. Vss.pt did demonstrate proper use of call bell, and verbalized understanding the need to call for safety.

## 2016-03-06 LAB — BASIC METABOLIC PANEL
Anion gap: 4 — ABNORMAL LOW (ref 5–15)
BUN: <5 mg/dL — ABNORMAL LOW (ref 6–20)
CO2: 26 mmol/L (ref 22–32)
Calcium: 8.7 mg/dL — ABNORMAL LOW (ref 8.9–10.3)
Chloride: 108 mmol/L (ref 101–111)
Creatinine, Ser: 0.43 mg/dL — ABNORMAL LOW (ref 0.44–1.00)
GFR calc Af Amer: >60 mL/min (ref >60)
GFR calc non Af Amer: >60 mL/min (ref >60)
Glucose, Bld: 181 mg/dL — ABNORMAL HIGH (ref 65–99)
Potassium: 3.6 mmol/L (ref 3.5–5.1)
Sodium: 138 mmol/L (ref 135–145)

## 2016-03-06 LAB — HEMOGLOBIN A1C
Hgb A1c MFr Bld: 14.8 % — ABNORMAL HIGH (ref 4.8–5.6)
Mean Plasma Glucose: 378 mg/dL

## 2016-03-06 LAB — GLUCOSE, CAPILLARY
Glucose-Capillary: 170 mg/dL — ABNORMAL HIGH (ref 65–99)
Glucose-Capillary: 187 mg/dL — ABNORMAL HIGH (ref 65–99)

## 2016-03-06 MED ORDER — INSULIN ASPART PROT & ASPART (70-30 MIX) 100 UNIT/ML ~~LOC~~ SUSP: 17.0000 [IU] | Freq: Two times a day (BID) | SUBCUTANEOUS | 11 refills | Status: DC

## 2016-03-06 NOTE — Discharge Summary (Signed)
Tina Brown, is a 30 y.o. female  DOB October 23, 1985  MRN [id number].  Admission date:  03/04/2016  Admitting Physician  Waldemar Dickens, MD  Discharge Date:  03/06/2016   Primary MD  Jenny Reichmann, MD  Recommendations for primary care physician for things to follow:  - Please check CBC, BMP during his visit - Monitor CBG logbook and adjust insulin dose as needed   Admission Diagnosis  Hyperglycemia [R73.9]   Discharge Diagnosis  Hyperglycemia [R73.9]    Active Problems:   Hyperglycemia   Diabetes mellitus with complication ([place])   Ketonuria   Generalized weakness   Hypokalemia      Past Medical History:  Diagnosis Date  . Diabetes mellitus without complication ([place])     History reviewed. No pertinent surgical history.     History of present illness and  Hospital Course:     Kindly see H&P for history of present illness and admission details, please review complete Labs, Consult reports and Test reports for all details in brief  HPI  from the history and physical done on the day of admission 03/04/2016 HPI: Tina Brown is a 30 y.o. female with medical history significant of diabetes. Patient tolerated by Arabic interpreter. 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 Patient states that she has not followed up with a physician or taken her insulin for several months due to loss of insurance and cost of appointments and medicine. Patient also states that her glucose meter is now broken. One millimeter was working glucose was ranging in the 2-300 range. Patient endorses several day history of polydipsia and polyuria with generalized weakness. Symptoms are fairly constant and getting worse. Nothing makes her symptoms better. Patient denies any dysuria, back pain, fevers, chest pain, shortness of breath, palpitations, nausea, vomiting,  diarrhea, neck stiffness, headache. Patient has been compliant with her metformin.   ED Course: objective findings below. Given IVF and Insulin   Hospital Course    DM/Hyperglycemia: -  Anion gap 15 w/ low bicarb of 20 and ketonuria, w/ generalized weakness, polyruia and polydypsia. No primary care f/u and no insulin at home. Pt likely in early stages of DKA. She was admitted, started on IV fluids, insulin sliding scale, good CBG control, patient was counseled about importance of medication compliance, she did continue with her metformin, she stopped taking her insulin NPH for a few months now as she could not afford it often she ran out of the samples she was given by Dr.Gherge. Burnis Medin discharge her metformin 1000 mg oral twice a day, which is available at [facility] $4 medication list. - Will be discharged on Novolin 70/30 17 units twice a day, which is available OTC at $24 per while at [facility]. - [facility]- [facility] - She was instructed to purchase over-the-counter glucometer with strips and lancets, which she reports she can afford.  Hypokalemia:  - Repleted.       Discharge Condition:  stable  Follow UP  Follow-up Information    [facility]. Call today.   Why:  Please call on August 28th for post hospital follow up appointment. Contact information: [address] [place] [phone]            Discharge Instructions  and  Discharge Medications     Discharge Instructions    Discharge instructions    Complete by:  As directed   [facility]  Get CBC, CMP,  checked  by Primary MD next visit.    Activity: As tolerated with Full fall precautions use walker/cane & assistance as needed  Disposition Home    Diet: Heart Healthy heart healthy , with feeding assistance and aspiration precautions.  On your next visit with your primary care  physician please Get Medicines reviewed and adjusted.   Please request your Prim.MD to go over all Hospital Tests and Procedure/Radiological results at the follow up, please get all Hospital records sent to your Prim MD by signing hospital release before you go home.   If you experience worsening of your admission symptoms, develop shortness of breath, life threatening emergency, suicidal or homicidal thoughts you must seek medical attention immediately by calling 911 or calling your MD immediately  if symptoms less severe.  You Must read complete instructions/literature along with all the possible adverse reactions/side effects for all the Medicines you take and that have been prescribed to you. Take any new Medicines after you have completely understood and accpet all the possible adverse reactions/side effects.   Do not drive, operating heavy machinery, perform activities at heights, swimming or participation in water activities or provide baby sitting services if your were admitted for syncope or siezures until you have seen by Primary MD or a Neurologist and advised to do so again.  Do not drive when taking Pain medications.    Do not take more than prescribed Pain, Sleep and Anxiety Medications  Special Instructions: If you have smoked or chewed Tobacco  in the last 2 yrs please stop smoking, stop any regular Alcohol  and or any Recreational drug use.  Wear Seat belts while driving.   Please note  You were cared for by a hospitalist during your hospital stay. If you have any questions about your discharge medications or the care you received while you were in the hospital after you are discharged, you can call the unit and asked to speak with the hospitalist on call if the hospitalist that took care of you is not available. Once you are discharged, your primary care physician will handle any further medical issues. Please note that NO REFILLS for any discharge medications will be  authorized once you are discharged, as it is imperative that you return to your primary care physician (or establish a relationship with a primary care physician if you do not have one) for your aftercare needs so that they can reassess your need for medications and monitor your lab values.   Discharge instructions    Complete by:  As directed   [facility]  Get CBC, CMP,  checked  by Primary MD next visit.    Activity: As tolerated with Full fall precautions use walker/cane & assistance as needed  Disposition Home    Diet: Carb modified , with feeding assistance and aspiration precautions.  On your next visit with your primary care physician please Get Medicines reviewed and adjusted.   Please  request your Prim.MD to go over all Hospital Tests and Procedure/Radiological results at the follow up, please get all Hospital records sent to your Prim MD by signing hospital release before you go home.   If you experience worsening of your admission symptoms, develop shortness of breath, life threatening emergency, suicidal or homicidal thoughts you must seek medical attention immediately by calling 911 or calling your MD immediately  if symptoms less severe.  You Must read complete instructions/literature along with all the possible adverse reactions/side effects for all the Medicines you take and that have been prescribed to you. Take any new Medicines after you have completely understood and accpet all the possible adverse reactions/side effects.   Do not drive, operating heavy machinery, perform activities at heights, swimming or participation in water activities or provide baby sitting services if your were admitted for syncope or siezures until you have seen by Primary MD or a Neurologist and advised to do so again.  Do not drive when taking Pain medications.    Do not take more than prescribed Pain, Sleep and Anxiety Medications  Special Instructions: If you have  smoked or chewed Tobacco  in the last 2 yrs please stop smoking, stop any regular Alcohol  and or any Recreational drug use.  Wear Seat belts while driving.   Please note  You were cared for by a hospitalist during your hospital stay. If you have any questions about your discharge medications or the care you received while you were in the hospital after you are discharged, you can call the unit and asked to speak with the hospitalist on call if the hospitalist that took care of you is not available. Once you are discharged, your primary care physician will handle any further medical issues. Please note that NO REFILLS for any discharge medications will be authorized once you are discharged, as it is imperative that you return to your primary care physician (or establish a relationship with a primary care physician if you do not have one) for your aftercare needs so that they can reassess your need for medications and monitor your lab values.   Increase activity slowly    Complete by:  As directed   Increase activity slowly    Complete by:  As directed       Medication List    STOP taking these medications   fluconazole 150 MG tablet Commonly known as:  DIFLUCAN   HUMULIN N KWIKPEN [place]   insulin NPH Human 100 UNIT/ML injection Commonly known as:  HUMULIN N   Insulin Pen Needle 32G X 4 MM Misc Commonly known as:  CAREFINE PEN NEEDLES     TAKE these medications   folic acid [id number] MCG tablet Commonly known as:  FOLVITE Take 400 mcg by mouth daily.   HAIR/SKIN/NAILS PO Take 1 tablet by mouth daily as needed.   insulin aspart protamine- aspart (70-30) 100 UNIT/ML injection Commonly known as:  NOVOLOG MIX 70/30 Inject 0.17 mLs (17 Units total) into the skin 2 (two) times daily with a meal.   Insulin Syringe 27G X 1/2" 0.5 ML Misc 1 each by Does not apply route 2 (two) times daily before a meal.   metFORMIN 1000 MG tablet Commonly known as:  GLUCOPHAGE Take 1 tablet (1,000 mg total)  by mouth 2 (two) times daily with a meal. What changed:  See the new instructions.         Diet and Activity recommendation: See Discharge Instructions above   Consults  obtained -  Diabetic coordinator   Major procedures and Radiology Reports - PLEASE review detailed and final reports for all details, in brief -      No results found.  Micro Results     No results found for this or any previous visit (from the past 240 hour(s)).     Today   Subjective:   Tina Brown today has no headache,no chest abdominal pain,no new weakness tingling or numbness, feels much better wants to go home today.   Objective:   Blood pressure (!) 96/59, pulse 90, temperature 98.6 F (37 C), resp. rate 18, weight 62.3 kg (137 lb 4.8 oz), last menstrual period 02/28/2016, SpO2 100 %.   Intake/Output Summary (Last 24 hours) at 03/06/16 1018 Last data filed at 03/06/16 0519  Gross per 24 hour  Intake           766.67 ml  Output              350 ml  Net           416.67 ml    Exam Awake Alert, Oriented x 3, No new F.N deficits, Normal affect [place].AT,PERRAL Supple Neck,No JVD, No cervical lymphadenopathy appriciated.  Symmetrical Chest wall movement, Good air movement bilaterally, CTAB RRR,No Gallops,Rubs or new Murmurs, No Parasternal Heave +ve B.Sounds, Abd Soft, Non tender, No organomegaly appriciated, No rebound -guarding or rigidity. No Cyanosis, Clubbing or edema, No new Rash or bruise  Data Review   CBC w Diff:  Lab Results  Component Value Date   WBC 4.9 03/05/2016   HGB 12.4 03/05/2016   HCT 39.3 03/05/2016   PLT 234 03/05/2016   LYMPHOPCT 45 12/01/2015   MONOPCT 8 12/01/2015   EOSPCT 2 12/01/2015   BASOPCT 1 12/01/2015    CMP:  Lab Results  Component Value Date   NA 138 03/06/2016   K 3.6 03/06/2016   CL 108 03/06/2016   CO2 26 03/06/2016   BUN <5 (L) 03/06/2016   CREATININE 0.43 (L) 03/06/2016   CREATININE 0.75 07/14/2013  .   Total Time in  preparing paper work, data evaluation and todays exam - 35 minutes  Holland Kotter M.D on 03/06/2016 at 10:18 AM  [facility]   Office  [phone]

## 2016-03-06 NOTE — Progress Notes (Signed)
Tina Brown to be D/C'd to home per MD order. An interpreter was used to do discharge teaching.  Discussed with the patient and all questions fully answered.  VSS, Skin clean, dry and intact without evidence of skin break down, no evidence of skin tears noted. IV catheter discontinued intact. Site without signs and symptoms of complications. Dressing and pressure applied.  An After Visit Summary was printed and given to the patient. Patient received prescriptions and educated on where to receive prescriptions.  D/c education completed with patient/family including follow up instructions, medication list, d/c activities limitations if indicated, with other d/c instructions as indicated by MD - patient able to verbalize understanding, all questions fully answered.   Patient instructed to return to ED, call 911, or call MD for any changes in condition.   Patient escorted via [place], and D/C home via private auto.  Morley Kos Price 03/06/2016 12:08 PM

## 2016-03-06 NOTE — Discharge Instructions (Signed)
[facility]  Get CBC, CMP,  checked  by Primary MD next visit.    Activity: As tolerated with Full fall precautions use walker/cane & assistance as needed  Disposition Home    Diet: Carb modified , with feeding assistance and aspiration precautions.  On your next visit with your primary care physician please Get Medicines reviewed and adjusted.   Please request your Prim.MD to go over all Hospital Tests and Procedure/Radiological results at the follow up, please get all Hospital records sent to your Prim MD by signing hospital release before you go home.   If you experience worsening of your admission symptoms, develop shortness of breath, life threatening emergency, suicidal or homicidal thoughts you must seek medical attention immediately by calling 911 or calling your MD immediately  if symptoms less severe.  You Must read complete instructions/literature along with all the possible adverse reactions/side effects for all the Medicines you take and that have been prescribed to you. Take any new Medicines after you have completely understood and accpet all the possible adverse reactions/side effects.   Do not drive, operating heavy machinery, perform activities at heights, swimming or participation in water activities or provide baby sitting services if your were admitted for syncope or siezures until you have seen by Primary MD or a Neurologist and advised to do so again.  Do not drive when taking Pain medications.    Do not take more than prescribed Pain, Sleep and Anxiety Medications  Special Instructions: If you have smoked or chewed Tobacco  in the last 2 yrs please stop smoking, stop any regular Alcohol  and or any Recreational drug use.  Wear Seat belts while driving.   Please note  You were cared for by a hospitalist during your hospital stay. If you have any questions about your discharge medications or the care you received while you were in the  hospital after you are discharged, you can call the unit and asked to speak with the hospitalist on call if the hospitalist that took care of you is not available. Once you are discharged, your primary care physician will handle any further medical issues. Please note that NO REFILLS for any discharge medications will be authorized once you are discharged, as it is imperative that you return to your primary care physician (or establish a relationship with a primary care physician if you do not have one) for your aftercare needs so that they can reassess your need for medications and monitor your lab values.

## 2016-03-12 DIAGNOSIS — E119 Type 2 diabetes mellitus without complications: Secondary | ICD-10-CM

## 2016-03-13 NOTE — Congregational Nurse Program (Signed)
Congregational Nurse Program Note  Date of Encounter: 03/12/2016  Past Medical History: No past medical history on file.  Encounter Details:     CNP Questionnaire - 03/13/16 1420      Patient Demographics   Is this a new or existing patient? Existing   Patient is considered a/an Refugee   Race African     Patient Assistance   Patient's financial/insurance status Self-Pay   Uninsured Patient Yes   Interventions Referred to ED/Urgent Care   Patient referred to apply for the following financial assistance [employer] insecurities addressed Not Applicable   Transportation assistance No   Assistance securing medications No   Educational health offerings Medications;Diabetes     Encounter Details   Primary purpose of visit Acute Illness/Condition Visit   Was an Emergency Department visit averted? No   Does patient have a medical provider? No   Patient referred to Urgent Care   Was a mental health screening completed? (GAINS tool) No   Does patient have dental issues? No   Does patient have vision issues? No   Does your patient have an abnormal blood pressure today? No   Since previous encounter, have you referred patient for abnormal blood pressure that resulted in a new diagnosis or medication change? No   Does your patient have an abnormal blood glucose today? Yes   Since previous encounter, have you referred patient for abnormal blood glucose that resulted in a new diagnosis or medication change? No   Was there a life-saving intervention made? Yes     Return visit to nurse office at [facility] with concern regarding medication change for diabetes. Received update from client regarding emergency care and her delay in following through with referral.  Reviewed 24 hour blood sugar; 289 @ 10 pm; 208 @ 8 am today. Insulin and Metformin taken as directed; Metformin ii added twice daily. Stated "I don't feel well after taking Metformin;weak and vision blurry".  Unable to check BS in office today. Agreed to perform test upon arriving home. Glucometer working. Plan: Discussed danger of delaying emergency care with elevated blood sugar. Return to office tomorrow and provide glucose testing results for last 24 hours. Bring all meds for review and discussion. Information " Living With Diabetes/A guide for you and your family" and information sheet on hypoglycemia and hyperglycemia.

## 2016-03-13 NOTE — Congregational Nurse Program (Signed)
Congregational Nurse Program Note  Date of Encounter: 02/27/2016  Past Medical History: No past medical history on file.  Encounter Details:     CNP Questionnaire - 03/13/16 1420      Patient Demographics   Is this a new or existing patient? Existing   Patient is considered a/an Refugee   Race African     Patient Assistance   Patient's financial/insurance status Self-Pay   Uninsured Patient Yes   Interventions Referred to ED/Urgent Care   Patient referred to apply for the following financial assistance [employer] insecurities addressed Not Applicable   Transportation assistance No   Assistance securing medications No   Educational health offerings Medications;Diabetes     Encounter Details   Primary purpose of visit Acute Illness/Condition Visit   Was an Emergency Department visit averted? No   Does patient have a medical provider? No   Patient referred to Urgent Care   Was a mental health screening completed? (GAINS tool) No   Does patient have dental issues? No   Does patient have vision issues? No   Does your patient have an abnormal blood pressure today? No   Since previous encounter, have you referred patient for abnormal blood pressure that resulted in a new diagnosis or medication change? No   Does your patient have an abnormal blood glucose today? Yes   Since previous encounter, have you referred patient for abnormal blood glucose that resulted in a new diagnosis or medication change? No   Was there a life-saving intervention made? Yes      Office visit on 02/27/16 at [facility] for this very pleasant Arabic speaking lady witRh intermittent visits for concern about elevated B/S's. Currently administering insulin for control of diabetes, but feels it remains elevated. Knowledgeable about diet and exercise to manage condition.Problem with home Glucometer today.  CBG 421 at 10:20 am. Non-fasting. Alert and related information well. Complained  of numbness in both feet and constantly feels like vision is blurry intermittently. Reluctant to use hospital emergency services due to insurance status. Aware of options and income requirements for income based coverage. Plan: Refer to [facility] immediately; contact spouse. Refer to agency SW to discuss insurance options. Focus on PCP for future management of diabetes. Return to office for further support and discussion about diabetes. Refer to [facility] for purchase of less expensive glucometer and strips. Follow-up at school weekly for support, testing and review of progress.  Jannetta Quint, RN/CN

## 2016-04-19 ENCOUNTER — Ambulatory Visit (INDEPENDENT_AMBULATORY_CARE_PROVIDER_SITE_OTHER): Payer: Self-pay | Admitting: Family Medicine

## 2016-04-19 ENCOUNTER — Encounter: Payer: Self-pay | Admitting: Family Medicine

## 2016-04-19 VITALS — BP 109/68 | HR 88 | Temp 98.3°F | Resp 14 | Ht 66.0 in | Wt 141.0 lb

## 2016-04-19 DIAGNOSIS — Z23 Encounter for immunization: Secondary | ICD-10-CM

## 2016-04-19 DIAGNOSIS — E119 Type 2 diabetes mellitus without complications: Secondary | ICD-10-CM

## 2016-04-19 NOTE — Progress Notes (Signed)
Tina Brown, is a 30 y.o. female  [id number]  [id number]  DOB - 1985/09/08  CC:  Chief Complaint  Patient presents with  . Establish Care  . Diabetes  . Eye Problem    patient states "something on my eye that is not normal"        HPI: Tina Brown is a 30 y.o. female here to establish care. Information is obtaine with difficulty with help of arabic interpreter. She has a diagnosis of diabetes. Her most recent A1C in late August was 14.8. She had been off medication for several months.  She reports that now she is taking 70/30 insulin and metformin 1000 bid and her BS in the morning fasting have been dow as low as 66.   Health Maintenance: Accepts Tdap and influenza today. She is in need of diabetic eye exam, urine micral and foot exam. She reports having been screen for HIV.  No Known Allergies Past Medical History:  Diagnosis Date  . Diabetes mellitus without complication [facility])    Current Outpatient Prescriptions on File Prior to Visit  Medication Sig Dispense Refill  . insulin aspart protamine- aspart (NOVOLOG MIX 70/30) (70-30) 100 UNIT/ML injection Inject 0.17 mLs (17 Units total) into the skin 2 (two) times daily with a meal. 10 mL 11  . Insulin Syringe 27G X 1/2" 0.5 ML MISC 1 each by Does not apply route 2 (two) times daily before a meal. 100 each 3  . metFORMIN (GLUCOPHAGE) 1000 MG tablet Take 1 tablet (1,000 mg total) by mouth 2 (two) times daily with a meal. 180 tablet 1  . Biotin w/ Vitamins C & E (HAIR/SKIN/NAILS PO) Take 1 tablet by mouth daily as needed.    . folic acid (FOLVITE) [id number] MCG tablet Take 400 mcg by mouth daily.     No current facility-administered medications on file prior to visit.    Family History  Problem Relation Age of Onset  . Diabetes Father    Social History   Social History  . Marital status: Married    Spouse name: N/A  . Number of children: N/A  . Years of education: N/A   Occupational History  . Not on file.   Social  History Main Topics  . Smoking status: Never Smoker  . Smokeless tobacco: Never Used  . Alcohol use No  . Drug use: No  . Sexual activity: Not on file   Other Topics Concern  . Not on file   Social History Narrative  . No narrative on file    Review of Systems: Constitutional: Negative Skin: Negative HENT: + for itchy eyes at times Eyes: Negative  Neck: Negative Respiratory: Negative Cardiovascular: Negative Gastrointestinal: Negative Genitourinary: Negative  Musculoskeletal: Negative   Neurological: Negative for Hematological: Negative  Psychiatric/Behavioral: Negative    Objective:   Vitals:   04/19/16 1018  BP: 109/68  Pulse: 88  Resp: 14  Temp: 98.3 F (36.8 C)    Physical Exam: Constitutional: Patient appears well-developed and well-nourished. No distress. HENT: Normocephalic, atraumatic, External right and left ear normal. Oropharynx is clear and moist.  Eyes: Conjunctivae and EOM are normal. PERRLA, no scleral icterus. Neck: Normal ROM. Neck supple. No lymphadenopathy, No thyromegaly. CVS: RRR, S1/S2 +, no murmurs, no gallops, no rubs Pulmonary: Effort and breath sounds normal, no stridor, rhonchi, wheezes, rales.  Abdominal: Soft. Normoactive BS,, no distension, tenderness, rebound or guarding.  Musculoskeletal: Normal range of motion. No edema and no tenderness.  Neuro: Alert.Normal muscle tone coordination.  Non-focal Skin: Skin is warm and dry. No rash noted. Not diaphoretic. No erythema. No pallor. Psychiatric: Normal mood and affect. Behavior, judgment, thought content normal.  Lab Results  Component Value Date   WBC 4.9 03/05/2016   HGB 12.4 03/05/2016   HCT 39.3 03/05/2016   MCV 91.2 03/05/2016   PLT 234 03/05/2016   Lab Results  Component Value Date   CREATININE 0.43 (L) 03/06/2016   BUN <5 (L) 03/06/2016   NA 138 03/06/2016   K 3.6 03/06/2016   CL 108 03/06/2016   CO2 26 03/06/2016    Lab Results  Component Value Date   HGBA1C  14.8 (H) 03/05/2016   Lipid Panel  No results found for: CHOL, TRIG, HDL, CHOLHDL, VLDL, LDLCALC     Assessment and plan:   1. Type 2 diabetes mellitus without complication, unspecified long term insulin use status (HCC)  - Microalbumin, urine -Will stop metformin for now and continue 70/30 17 units bid. -Bring meter in for review in 2 weeks -Follow-up appt around 11/22 for A1C.  -Will perform foot exam at next visit.  2. Need for Tdap vaccination  - Tdap vaccine greater than or equal to 7yo IM  3. Need for prophylactic vaccination and inoculation against influenza  - Flu Vaccine QUAD 36+ mos PF IM (Fluarix & Fluzone Quad PF)   Return in about 2 weeks (around 05/03/2016) for Follow-up visit around 11/22.  The patient was given clear instructions to go to ER or return to medical center if symptoms don't improve, worsen or new problems develop. The patient verbalized understanding.    Micheline Chapman FNP  04/19/2016, 11:18 AM

## 2016-04-19 NOTE — Patient Instructions (Signed)
Stop metformin. Continue same insulin dose Bring in meter in two weeks for me to review.

## 2016-04-20 LAB — MICROALBUMIN, URINE: Microalb, Ur: 1.7 mg/dL

## 2016-05-03 ENCOUNTER — Ambulatory Visit: Payer: Self-pay

## 2016-05-31 ENCOUNTER — Ambulatory Visit (INDEPENDENT_AMBULATORY_CARE_PROVIDER_SITE_OTHER): Payer: Self-pay | Admitting: Family Medicine

## 2016-05-31 VITALS — BP 109/61 | HR 77 | Temp 98.8°F | Resp 18 | Ht 66.5 in | Wt 144.0 lb

## 2016-05-31 DIAGNOSIS — E119 Type 2 diabetes mellitus without complications: Secondary | ICD-10-CM

## 2016-05-31 DIAGNOSIS — L659 Nonscarring hair loss, unspecified: Secondary | ICD-10-CM

## 2016-05-31 LAB — COMPLETE METABOLIC PANEL WITH GFR
ALT: 7 U/L (ref 6–29)
AST: 11 U/L (ref 10–30)
Albumin: 4.1 g/dL (ref 3.6–5.1)
Alkaline Phosphatase: 51 U/L (ref 33–115)
BUN: 11 mg/dL (ref 7–25)
CO2: 28 mmol/L (ref 20–31)
Calcium: 9.9 mg/dL (ref 8.6–10.2)
Chloride: 102 mmol/L (ref 98–110)
Creat: 0.65 mg/dL (ref 0.50–1.10)
GFR, Est African American: >89 mL/min (ref >=60)
GFR, Est Non African American: >89 mL/min (ref >=60)
Glucose, Bld: 212 mg/dL — ABNORMAL HIGH (ref 65–99)
Potassium: 4.3 mmol/L (ref 3.5–5.3)
Sodium: 136 mmol/L (ref 135–146)
Total Bilirubin: 0.3 mg/dL (ref 0.2–1.2)
Total Protein: 7 g/dL (ref 6.1–8.1)

## 2016-05-31 LAB — CBC WITH DIFFERENTIAL/PLATELET
Basophils Absolute: 41 cells/uL (ref 0–200)
Basophils Relative: 1 %
Eosinophils Absolute: 246 cells/uL (ref 15–500)
Eosinophils Relative: 6 %
HCT: 40.4 % (ref 35.0–45.0)
Hemoglobin: 13.3 g/dL (ref 11.7–15.5)
Lymphocytes Relative: 46 %
Lymphs Abs: 1886 cells/uL (ref 850–3900)
MCH: 29.1 pg (ref 27.0–33.0)
MCHC: 32.9 g/dL (ref 32.0–36.0)
MCV: 88.4 fL (ref 80.0–100.0)
MPV: 9.8 fL (ref 7.5–12.5)
Monocytes Absolute: 287 cells/uL (ref 200–950)
Monocytes Relative: 7 %
Neutro Abs: 1640 cells/uL (ref 1500–7800)
Neutrophils Relative %: 40 %
Platelets: 273 10*3/uL (ref 140–400)
RBC: 4.57 MIL/uL (ref 3.80–5.10)
RDW: 13.4 % (ref 11.0–15.0)
WBC: 4.1 10*3/uL (ref 3.8–10.8)

## 2016-05-31 LAB — TSH: TSH: 0.68 mIU/L

## 2016-05-31 LAB — POCT GLYCOSYLATED HEMOGLOBIN (HGB A1C): Hemoglobin A1C: 8.3

## 2016-05-31 MED ORDER — INSULIN ASPART PROT & ASPART (70-30 MIX) 100 UNIT/ML ~~LOC~~ SUSP: 20.0000 [IU] | Freq: Two times a day (BID) | SUBCUTANEOUS | 11 refills | Status: DC

## 2016-05-31 NOTE — Progress Notes (Signed)
Tina Brown, is a 30 y.o. female  [id number]  [id number]  DOB - [birth year]  CC:  Chief Complaint  Patient presents with  . Follow-up       HPI: Tina Brown is a 30 y.o. female here for follow-up on diabetes. She is currently on Novolog 70/30 17 in am and 17 in pm and metformin 1000 bid. Her A1C is 8.3, down from 14.8 in August. She was not taking her medication at that time. She reports she is taking her medications regularly. She is trying to follow a diabetic diet but does not exercise regularly. Her major concern at this time is hair loss.    No Known Allergies Past Medical History:  Diagnosis Date  . Diabetes mellitus without complication [facility])    Current Outpatient Prescriptions on File Prior to Visit  Medication Sig Dispense Refill  . Biotin w/ Vitamins C & E (HAIR/SKIN/NAILS PO) Take 1 tablet by mouth daily as needed.    . Insulin Syringe 27G X 1/2" 0.5 ML MISC 1 each by Does not apply route 2 (two) times daily before a meal. 100 each 3   No current facility-administered medications on file prior to visit.    Family History  Problem Relation Age of Onset  . Diabetes Father    Social History   Social History  . Marital status: Married    Spouse name: N/A  . Number of children: N/A  . Years of education: N/A   Occupational History  . Not on file.   Social History Main Topics  . Smoking status: Never Smoker  . Smokeless tobacco: Never Used  . Alcohol use No  . Drug use: No  . Sexual activity: Not on file   Other Topics Concern  . Not on file   Social History Narrative  . No narrative on file    Review of Systems: Constitutional: + hair loss Skin: Negative HENT: Negative  Eyes: Negative  Neck: Negative Respiratory: Negative Cardiovascular: Negative Gastrointestinal: Negative Genitourinary: Negative  Musculoskeletal: Negative   Neurological: Negative for Hematological: Negative  Psychiatric/Behavioral: Negative    Objective:    Vitals:   05/31/16 1035  BP: 109/61  Pulse: 77  Resp: 18  Temp: 98.8 F (37.1 C)    Physical Exam: Constitutional: Patient appears well-developed and well-nourished. No distress. HENT: Normocephalic, atraumatic, External right and left ear normal. Oropharynx is clear and moist.  Eyes: Conjunctivae and EOM are normal. PERRLA, no scleral icterus. Neck: Normal ROM. Neck supple. No lymphadenopathy, No thyromegaly. CVS: RRR, S1/S2 +, no murmurs, no gallops, no rubs Pulmonary: Effort and breath sounds normal, no stridor, rhonchi, wheezes, rales.  Abdominal: Soft. Normoactive BS,, no distension, tenderness, rebound or guarding.  Musculoskeletal: Normal range of motion. No edema and no tenderness.  Neuro: Alert.Normal muscle tone coordination. Non-focal Skin: Skin is warm and dry. No rash noted. Not diaphoretic. No erythema. No pallor. Psychiatric: Normal mood and affect. Behavior, judgment, thought content normal.  Lab Results  Component Value Date   WBC 4.9 03/05/2016   HGB 12.4 03/05/2016   HCT 39.3 03/05/2016   MCV 91.2 03/05/2016   PLT 234 03/05/2016   Lab Results  Component Value Date   CREATININE 0.43 (L) 03/06/2016   BUN <5 (L) 03/06/2016   NA 138 03/06/2016   K 3.6 03/06/2016   CL 108 03/06/2016   CO2 26 03/06/2016    Lab Results  Component Value Date   HGBA1C 14.8 (H) 03/05/2016   Lipid Panel  No  results found for: CHOL, TRIG, HDL, CHOLHDL, VLDL, LDLCALC      Assessment and plan:   1. Type 2 diabetes mellitus without complication, unspecified long term insulin use status (HCC)  - COMPLETE METABOLIC PANEL WITH GFR - CBC with Differential - Ambulatory referral to Ophthalmology - HgB A1c  2. Hair loss  - TSH   Return in about 3 months (around 08/31/2016).  The patient was given clear instructions to go to ER or return to medical center if symptoms don't improve, worsen or new problems develop. The patient verbalized understanding.    Micheline Chapman FNP  05/31/2016, 4:17 PM

## 2016-05-31 NOTE — Progress Notes (Signed)
Patient is here for FU  Patient denies pain at this time.  Patient has not taken medication today. Patient has eaten today.

## 2016-05-31 NOTE — Patient Instructions (Signed)
Increase insulin to 20 units in am and 20 units in pm. Recheck in 3 months.

## 2016-08-09 ENCOUNTER — Telehealth: Payer: Self-pay

## 2016-08-09 MED ORDER — INSULIN ASPART PROT & ASPART (70-30 MIX) 100 UNIT/ML ~~LOC~~ SUSP: 20.0000 [IU] | Freq: Two times a day (BID) | SUBCUTANEOUS | 11 refills | Status: DC

## 2016-08-09 NOTE — Telephone Encounter (Signed)
Refill for insulin sent into pharmacy. Thanks!

## 2016-08-19 ENCOUNTER — Ambulatory Visit (INDEPENDENT_AMBULATORY_CARE_PROVIDER_SITE_OTHER): Payer: BLUE CROSS/BLUE SHIELD | Admitting: Family Medicine

## 2016-08-19 ENCOUNTER — Encounter: Payer: Self-pay | Admitting: Family Medicine

## 2016-08-19 VITALS — BP 123/68 | HR 87 | Temp 99.2°F | Resp 18 | Ht 67.0 in | Wt 145.2 lb

## 2016-08-19 DIAGNOSIS — E119 Type 2 diabetes mellitus without complications: Secondary | ICD-10-CM

## 2016-08-19 LAB — POCT GLYCOSYLATED HEMOGLOBIN (HGB A1C): Hemoglobin A1C: 8.8

## 2016-08-19 NOTE — Patient Instructions (Signed)
Increase insulin to 25 units twice a day. Recheck in 3 months.

## 2016-08-26 NOTE — Progress Notes (Signed)
Tina Brown, is a 31 y.o. female  [id number]  [id number]  DOB - 01/20/1986  CC:  Chief Complaint  Patient presents with  . Follow-up    lab results       HPI: Tina Brown is a 31 y.o. female here for 3 month follow-up of diabetes. She is currently on Novolog 70/30, 20 units bid. Her has A1C was 8.3, today is 8.8. She is not exercising regularly and does not follow a strict diabetic diet. She has not specific complaints today.   No Known Allergies Past Medical History:  Diagnosis Date  . Diabetes mellitus without complication [facility])    Current Outpatient Prescriptions on File Prior to Visit  Medication Sig Dispense Refill  . insulin aspart protamine- aspart (NOVOLOG MIX 70/30) (70-30) 100 UNIT/ML injection Inject 0.2 mLs (20 Units total) into the skin 2 (two) times daily with a meal. 10 mL 11  . Insulin Syringe 27G X 1/2" 0.5 ML MISC 1 each by Does not apply route 2 (two) times daily before a meal. 100 each 3  . Biotin w/ Vitamins C & E (HAIR/SKIN/NAILS PO) Take 1 tablet by mouth daily as needed.     No current facility-administered medications on file prior to visit.    Family History  Problem Relation Age of Onset  . Diabetes Father    Social History   Social History  . Marital status: Married    Spouse name: N/A  . Number of children: N/A  . Years of education: N/A   Occupational History  . Not on file.   Social History Main Topics  . Smoking status: Never Smoker  . Smokeless tobacco: Never Used  . Alcohol use No  . Drug use: No  . Sexual activity: Not on file   Other Topics Concern  . Not on file   Social History Narrative  . No narrative on file    Review of Systems: Constitutional: Negative Skin: Negative HENT: Negative  Eyes: Negative  Neck: Negative Respiratory: Negative Cardiovascular: Negative Gastrointestinal: Negative Genitourinary: Negative  Musculoskeletal: Negative   Neurological: Negative for Hematological: Negative   Psychiatric/Behavioral: Negative    Objective:   Vitals:   08/19/16 1517  BP: 123/68  Pulse: 87  Resp: 18  Temp: 99.2 F (37.3 C)    Physical Exam: Constitutional: Patient appears well-developed and well-nourished. No distress. HENT: Normocephalic, atraumatic, External right and left ear normal. Oropharynx is clear and moist.  Eyes: Conjunctivae and EOM are normal. PERRLA, no scleral icterus. Neck: Normal ROM. Neck supple. No lymphadenopathy, No thyromegaly. CVS: RRR, S1/S2 +, no murmurs, no gallops, no rubs Pulmonary: Effort and breath sounds normal, no stridor, rhonchi, wheezes, rales.  Abdominal: Soft. Normoactive BS,, no distension, tenderness, rebound or guarding.  Musculoskeletal: Normal range of motion. No edema and no tenderness.  Neuro: Alert.Normal muscle tone coordination. Non-focal Skin: Skin is warm and dry. No rash noted. Not diaphoretic. No erythema. No pallor. Psychiatric: Normal mood and affect. Behavior, judgment, thought content normal.  Lab Results  Component Value Date   WBC 4.1 05/31/2016   HGB 13.3 05/31/2016   HCT 40.4 05/31/2016   MCV 88.4 05/31/2016   PLT 273 05/31/2016   Lab Results  Component Value Date   CREATININE 0.65 05/31/2016   BUN 11 05/31/2016   NA 136 05/31/2016   K 4.3 05/31/2016   CL 102 05/31/2016   CO2 28 05/31/2016    Lab Results  Component Value Date   HGBA1C 8.8 08/19/2016  Lipid Panel  No results found for: CHOL, TRIG, HDL, CHOLHDL, VLDL, LDLCALC      Assessment and plan:   1. Type 2 diabetes mellitus without complication, unspecified long term insulin use status (HCC)  - POCT glycosylated hemoglobin (Hb A1C) -Increase Novolog to 25 units bid.   Return in about 3 months (around 11/16/2016).  The patient was given clear instructions to go to ER or return to medical center if symptoms don't improve, worsen or new problems develop. The patient verbalized understanding.    Micheline Chapman FNP  08/26/2016,  8:48 AM

## 2016-11-11 ENCOUNTER — Other Ambulatory Visit: Payer: Self-pay | Admitting: Family Medicine

## 2016-11-11 MED ORDER — "INSULIN SYRINGE 27G X 1/2"" 0.5 ML MISC": 1.0000 | Freq: Two times a day (BID) | 3 refills | Status: DC

## 2016-11-11 NOTE — Progress Notes (Signed)
Refilled syringes.

## 2016-11-13 ENCOUNTER — Telehealth: Payer: Self-pay

## 2016-11-13 NOTE — Telephone Encounter (Signed)
Spoke with pharmacy and gave a verbal.  

## 2016-11-19 ENCOUNTER — Ambulatory Visit: Payer: BLUE CROSS/BLUE SHIELD | Admitting: Family Medicine

## 2017-06-26 ENCOUNTER — Other Ambulatory Visit: Payer: Self-pay | Admitting: Family Medicine

## 2017-08-27 ENCOUNTER — Telehealth: Payer: Self-pay

## 2017-08-27 MED ORDER — INSULIN ASPART PROT & ASPART (70-30 MIX) 100 UNIT/ML ~~LOC~~ SUSP: 20.0000 [IU] | Freq: Two times a day (BID) | SUBCUTANEOUS | 1 refills | Status: DC

## 2017-09-03 ENCOUNTER — Encounter: Payer: Self-pay | Admitting: Family Medicine

## 2017-09-03 ENCOUNTER — Ambulatory Visit (INDEPENDENT_AMBULATORY_CARE_PROVIDER_SITE_OTHER): Payer: BLUE CROSS/BLUE SHIELD | Admitting: Family Medicine

## 2017-09-03 VITALS — BP 124/64 | HR 78 | Temp 98.2°F | Resp 14 | Ht 67.0 in | Wt 182.0 lb

## 2017-09-03 DIAGNOSIS — E119 Type 2 diabetes mellitus without complications: Secondary | ICD-10-CM

## 2017-09-03 LAB — POCT URINALYSIS DIP (DEVICE)
Bilirubin Urine: NEGATIVE
Glucose, UA: 500 mg/dL — AB
Ketones, ur: NEGATIVE mg/dL
Leukocytes, UA: NEGATIVE
Nitrite: NEGATIVE
Protein, ur: NEGATIVE mg/dL
Specific Gravity, Urine: 1.015 (ref 1.005–1.030)
Urobilinogen, UA: 0.2 mg/dL (ref 0.0–1.0)
pH: 5.5 (ref 5.0–8.0)

## 2017-09-03 LAB — POCT GLYCOSYLATED HEMOGLOBIN (HGB A1C): Hemoglobin A1C: 10.6

## 2017-09-03 LAB — GLUCOSE, CAPILLARY: Glucose-Capillary: 259 mg/dL — ABNORMAL HIGH (ref 65–99)

## 2017-09-03 MED ORDER — METFORMIN HCL 1000 MG PO TABS: 1000.0000 mg | ORAL_TABLET | Freq: Two times a day (BID) | ORAL | 3 refills | Status: DC

## 2017-09-03 MED ORDER — INSULIN PEN NEEDLE 29G X 12.7MM MISC: 2 refills | Status: DC

## 2017-09-03 MED ORDER — INSULIN GLARGINE 100 UNIT/ML SOLOSTAR PEN: 20.0000 [IU] | PEN_INJECTOR | Freq: Every day | SUBCUTANEOUS | 99 refills | Status: DC

## 2017-09-03 MED ORDER — LISINOPRIL 5 MG PO TABS: 5.0000 mg | ORAL_TABLET | Freq: Every day | ORAL | 3 refills | Status: DC

## 2017-09-03 NOTE — Progress Notes (Signed)
Patient ID: Tina Brown, female    DOB: March 04, 1986, 32 y.o.   MRN: [id number]  PCP: Tina Jun, FNP  Chief Complaint  Patient presents with  . Follow-up    diabetes    Subjective:  HPI-language interpreter used. Tina Brown is a 32 y.o. female with diabetes presents for evaluation of diabetes. Tina Brown has been lost to follow since 2//2018 and has not had diabetes status evaluation since that time. [name] monitor glucose at home and reports reading are consistently in 200-300 range. Last A1C 8.8. Adheres to current medication regimen. enies neuropathic pain, vision disturbances, polyuria, and polydipsia. Denies any unintentional weight gain or loss. Current Body mass index is 28.51 kg/m. Engages in no routine exercise although reports effort to manage carbohydrate intake. Denies hypoglycemia, chest pain, shortness of breath, dizziness, or new weakness. Social History   Socioeconomic History  . Marital status: Married    Spouse name: Not on file  . Number of children: Not on file  . Years of education: Not on file  . Highest education level: Not on file  Social Needs  . Financial resource strain: Not on file  . Food insecurity - worry: Not on file  . Food insecurity - inability: Not on file  . Transportation needs - medical: Not on file  . Transportation needs - non-medical: Not on file  Occupational History  . Not on file  Tobacco Use  . Smoking status: Never Smoker  . Smokeless tobacco: Never Used  Substance and Sexual Activity  . Alcohol use: No  . Drug use: No  . Sexual activity: Not on file  Other Topics Concern  . Not on file  Social History Narrative  . Not on file    Family History  Problem Relation Age of Onset  . Diabetes Father    Review of Systems Pertinent negatives listed in HPI Patient Active Problem List   Diagnosis Date Noted  . Hyperglycemia 03/04/2016  . Diabetes mellitus with complication ([place]) 72/53/6644  . Ketonuria 03/04/2016  . Generalized  weakness 03/04/2016  . Hypokalemia   . Diabetes mellitus without complication ([place]) 03/47/4259   No Known Allergies  Prior to Admission medications   Medication Sig Start Date End Date Taking? Authorizing Provider  BD INSULIN SYRINGE U/F 31G X 5/16" 0.5 ML MISC USE WITH INSULIN TWICE DAILY 06/26/17  Yes Tina Jun, FNP  insulin aspart protamine- aspart (NOVOLOG MIX 70/30) (70-30) 100 UNIT/ML injection Inject 0.2 mLs (20 Units total) into the skin 2 (two) times daily with a meal. 08/27/17  Yes Tina Jun, FNP  Insulin Syringe 27G X 1/2" 0.5 ML MISC 1 each by Does not apply route 2 (two) times daily before a meal. 11/11/16  Yes Tina Jun, FNP  Biotin w/ Vitamins C & E (HAIR/SKIN/NAILS PO) Take 1 tablet by mouth daily as needed.    [provider]    Past Medical, Surgical Family and Social History reviewed and updated.    Objective:   Today's Vitals   09/03/17 1111  BP: 124/64  Pulse: 78  Resp: 14  Temp: 98.2 F (36.8 C)  TempSrc: Oral  SpO2: 98%  Weight: 182 lb (82.6 kg)  Height: 5\' 7"  (1.702 m)    Wt Readings from Last 3 Encounters:  09/03/17 182 lb (82.6 kg)  08/19/16 145 lb 3.2 oz (65.9 kg)  05/31/16 144 lb (65.3 kg)    Physical Exam Constitutional: Patient appears well-developed and well-nourished. No distress. HENT: Normocephalic, atraumatic, External  right and left ear normal. Oropharynx is clear and moist.  Eyes: Conjunctivae and EOM are normal. PERRLA, no scleral icterus. Neck: Normal ROM. Neck supple. No JVD. No tracheal deviation. No thyromegaly. CVS: RRR, S1/S2 +, no murmurs, no gallops, no carotid bruit.  Pulmonary: Effort and breath sounds normal, no stridor, rhonchi, wheezes, rales.  Abdominal: Soft. BS +, no distension, tenderness, rebound or guarding.  Musculoskeletal: Normal range of motion. No edema and no tenderness.  Lymphadenopathy: No lymphadenopathy noted, cervical, inguinal or axillary Neuro: Alert and oriented.  Muscle tone coordination. No cranial nerve deficit. Skin: Skin is warm and dry. No rash noted. Not diaphoretic. No erythema. No pallor. Psychiatric: Normal mood and affect. Behavior, judgment, thought content normal.   Assessment & Plan:  1. Diabetes mellitus without complication ([place]), M3N 36.1. Uncontrolled. Discontinue Novolin. Start Lantus 20 units at bedtime every night. Start metformin 1000 mg twice daily with meals. Adding lisinopril 5 mg once daily for renal protection. Aim for 30 minutes of exercise most days. Increase intake of water.    Meds ordered this encounter  Medications  . metFORMIN (GLUCOPHAGE) 1000 MG tablet    Sig: Take 1 tablet (1,000 mg total) by mouth 2 (two) times daily with a meal.    Dispense:  180 tablet    Refill:  3    Order Specific Question:   Supervising Provider    Answer:   Tresa Garter [id number]  . Insulin Glargine (LANTUS SOLOSTAR) 100 UNIT/ML Solostar Pen    Sig: Inject 20 Units into the skin daily at 10 pm.    Dispense:  15 pen    Refill:  PRN    Order Specific Question:   Supervising Provider    Answer:   Tresa Garter [[id number]]  . lisinopril (PRINIVIL,ZESTRIL) 5 MG tablet    Sig: Take 1 tablet (5 mg total) by mouth daily.    Dispense:  90 tablet    Refill:  3    Order Specific Question:   Supervising Provider    Answer:   Tresa Garter [id number]  . Insulin Pen Needle (BD ULTRA-FINE PEN NEEDLES) 29G X 12.7MM MISC    Sig: ICD10 E11.9 for use with insulin administration    Dispense:  200 each    Refill:  2    Order Specific Question:   Supervising Provider    Answer:   Tresa Garter [[id number]]   Orders Placed This Encounter  Procedures  . Glucose, capillary  . Microalbumin/Creatinine Ratio, Urine  . CBC with Differential  . Comprehensive metabolic panel  . POCT glycosylated hemoglobin (Hb A1C)  . POCT urinalysis dip (device)    RTC: 6 weeks diabetes follow-up.    A total of 30 minutes spent, greater than  50 % of this time was spent utilizing an interpreter to communicate,   viewing prior medical history, reviewing medications and indications of treatment, prior labs and diagnostic tests, discussing current plan of treatment, health promotion, and goals of treatment.   Tina Brown. Tina Kingfisher, MSN, FNP-C [facility]  [address] [place], [place] [address] [phone]

## 2017-09-03 NOTE — Patient Instructions (Signed)
Discontinue Novolin 70/30  Start Lantus Pen 20 units once at bedtime every night.  Start metformin 1000 mg twice daily with  breakfast and dinner.  For renal protection, I  starting you on lisinopril 5 mg once daily        Lantus Pen 20        .      1000       .        lisinopril 5     tawaquf nawfulin 70/30 bad' Lantus Pen 20 wahdatan maratan wahidatan fi waqt alnawm kl laylatin. abda fi tanawul mitfurmin 1000 malagh maratayn ywmyana mae wajbati al'iiftar waleasha'i. lilhimayat alkulawiat , 'ana 'abda ealaa lisinopril 5 malagh marat wahidat yawmiaan

## 2017-09-04 LAB — COMPREHENSIVE METABOLIC PANEL
ALT: 12 IU/L (ref 0–32)
AST: 14 IU/L (ref 0–40)
Albumin/Globulin Ratio: 1.4 (ref 1.2–2.2)
Albumin: 4.4 g/dL (ref 3.5–5.5)
Alkaline Phosphatase: 91 IU/L (ref 39–117)
BUN/Creatinine Ratio: 16 (ref 9–23)
BUN: 11 mg/dL (ref 6–20)
Bilirubin Total: 0.2 mg/dL (ref 0.0–1.2)
CO2: 22 mmol/L (ref 20–29)
Calcium: 9.9 mg/dL (ref 8.7–10.2)
Chloride: 101 mmol/L (ref 96–106)
Creatinine, Ser: 0.67 mg/dL (ref 0.57–1.00)
GFR calc Af Amer: 133 mL/min/{1.73_m2} (ref >59)
GFR calc non Af Amer: 115 mL/min/{1.73_m2} (ref >59)
Globulin, Total: 3.2 g/dL (ref 1.5–4.5)
Glucose: 224 mg/dL — ABNORMAL HIGH (ref 65–99)
Potassium: 4.2 mmol/L (ref 3.5–5.2)
Sodium: 138 mmol/L (ref 134–144)
Total Protein: 7.6 g/dL (ref 6.0–8.5)

## 2017-09-04 LAB — CBC WITH DIFFERENTIAL/PLATELET
Basophils Absolute: 0 10*3/uL (ref 0.0–0.2)
Basos: 1 %
EOS (ABSOLUTE): 0.2 10*3/uL (ref 0.0–0.4)
Eos: 3 %
Hematocrit: 41.4 % (ref 34.0–46.6)
Hemoglobin: 13.4 g/dL (ref 11.1–15.9)
Immature Grans (Abs): 0 10*3/uL (ref 0.0–0.1)
Immature Granulocytes: 0 %
Lymphocytes Absolute: 2.5 10*3/uL (ref 0.7–3.1)
Lymphs: 40 %
MCH: 26.8 pg (ref 26.6–33.0)
MCHC: 32.4 g/dL (ref 31.5–35.7)
MCV: 83 fL (ref 79–97)
Monocytes Absolute: 0.5 10*3/uL (ref 0.1–0.9)
Monocytes: 9 %
Neutrophils Absolute: 3 10*3/uL (ref 1.4–7.0)
Neutrophils: 47 %
Platelets: 328 10*3/uL (ref 150–379)
RBC: 5 x10E6/uL (ref 3.77–5.28)
RDW: 14.7 % (ref 12.3–15.4)
WBC: 6.2 10*3/uL (ref 3.4–10.8)

## 2017-09-04 LAB — MICROALBUMIN / CREATININE URINE RATIO
Creatinine, Urine: 49.8 mg/dL
Microalb/Creat Ratio: <6 mg/g creat (ref 0.0–30.0)
Microalbumin, Urine: <3 ug/mL

## 2017-09-15 ENCOUNTER — Encounter: Payer: Self-pay | Admitting: Family Medicine

## 2017-09-15 NOTE — Progress Notes (Signed)
Mail lab letter  

## 2017-10-15 ENCOUNTER — Encounter: Payer: Self-pay | Admitting: Family Medicine

## 2017-10-15 ENCOUNTER — Ambulatory Visit (INDEPENDENT_AMBULATORY_CARE_PROVIDER_SITE_OTHER): Payer: BLUE CROSS/BLUE SHIELD | Admitting: Family Medicine

## 2017-10-15 VITALS — BP 118/60 | HR 82 | Temp 98.6°F | Ht 67.0 in | Wt 173.0 lb

## 2017-10-15 DIAGNOSIS — E119 Type 2 diabetes mellitus without complications: Secondary | ICD-10-CM | POA: Diagnosis not present

## 2017-10-15 DIAGNOSIS — H04129 Dry eye syndrome of unspecified lacrimal gland: Secondary | ICD-10-CM

## 2017-10-15 LAB — POCT GLYCOSYLATED HEMOGLOBIN (HGB A1C): Hemoglobin A1C: 9.6

## 2017-10-15 LAB — POCT URINALYSIS DIP (DEVICE)
Bilirubin Urine: NEGATIVE
Glucose, UA: NEGATIVE mg/dL
Hgb urine dipstick: NEGATIVE
Ketones, ur: NEGATIVE mg/dL
Leukocytes, UA: NEGATIVE
Nitrite: NEGATIVE
Protein, ur: NEGATIVE mg/dL
Specific Gravity, Urine: >=1.03 (ref 1.005–1.030)
Urobilinogen, UA: 0.2 mg/dL (ref 0.0–1.0)
pH: 5.5 (ref 5.0–8.0)

## 2017-10-15 MED ORDER — OLOPATADINE HCL 0.1 % OP SOLN: 1.0000 [drp] | Freq: Two times a day (BID) | OPHTHALMIC | 12 refills | Status: DC

## 2017-10-15 NOTE — Patient Instructions (Signed)
           .   A1C    .               Metformin 1000       . tabie Lantus fi 20 wahdat waqt alnawm mae sharihat min alluhm 'aw aljabn 'aw allabn limane huduth ainkhifad fi alsakriat fi aldm. A1C alkhasu bik fi tahsun. muasalat aljuhud litahsin alnashat albadinii hayth sayuadiy dhlk 'iilaa tahsin alsakariat fi aldami tuasil Metformin 1000 malgh mae wajabat al'iiftar mae wajabat almasa'.               . A1C    .                .        .               tabie Lantus fi 20 wahdat waqt alnawm mae sharihat min alluhm 'aw aljabn 'aw allabn limane huduth ainkhifad fi alsakriat fi aldm. A1C alkhasu bik fi tahsunu. aistamara fi badhl aljuhud litahsin alnashat albadnii li'ana dhlk sayazid min tahsin nisbat alsukar fi aldm. alhusul ealaa fahs aleayn qabl alsafar alqadima. laqad wusifat qatarat aleayn batanul lamaa 'azuna 'anah tahij aleayn aljafa aldhy tueani minh   Continue Lantus at 20 units at bedtime with a slice of meat, cheese, or yogurt to prevent drops in blood sugars. Your A1C is improving. Continue efforts to improve physical activity as this will further improve your blood sugars.Continue Metformin 1000 mg with breakfast and with evening meal.  Obtain an eye exam prior to your upcoming travel. I have prescribed Patanol eye drops for what I suspect is dry eye irritation that you are experiencing.

## 2017-10-15 NOTE — Progress Notes (Signed)
Patient ID: Tina Brown, female    DOB: [birth year], 32 y.o.   MRN: [id number]  PCP: Scot Jun, FNP  Chief Complaint  Patient presents with  . Follow-up    6 weeks on diabetes    Subjective:  HPI Tina Brown #[id number] Tina Brown is a 32 y.o. female presents for diabetes follow-up x 6 weeks. Tina Brown was last seen in office on 09/03/2017 and was found to have an A1C 10.6 and had experienced glucose readings consistently ranging 200-300. Tina Brown had been lost to follow-up since February 2018. During her last visit her diabetes regimen was prescribed as follows: Lantus  20 units once at bedtime every night,  metformin 1000 mg twice daily with breakfast and dinner, and lisinopril 5 mg once daily, for renal protection. Today, she endorses inconsistently taking metformin when she doesn't eat. She has also experienced a blood sugar upon awakening which dropped to 97, although she notes not consistently eating with insulin administration. She denies polyuria, polydipsia, reports some improvement of blurring vision.She will be travelling back to her home country within 6 weeks and will be abroad for at least 3 months. Social History   Socioeconomic History  . Marital status: Married    Spouse name: Not on file  . Number of children: Not on file  . Years of education: Not on file  . Highest education level: Not on file  Occupational History  . Not on file  Social Needs  . Financial resource strain: Not on file  . Food insecurity:    Worry: Not on file    Inability: Not on file  . Transportation needs:    Medical: Not on file    Non-medical: Not on file  Tobacco Use  . Smoking status: Never Smoker  . Smokeless tobacco: Never Used  Substance and Sexual Activity  . Alcohol use: No  . Drug use: No  . Sexual activity: Not on file  Lifestyle  . Physical activity:    Days per week: Not on file    Minutes per session: Not on file  . Stress: Not on file  Relationships  . Social connections:     Talks on phone: Not on file    Gets together: Not on file    Attends religious service: Not on file    Active member of club or organization: Not on file    Attends meetings of clubs or organizations: Not on file    Relationship status: Not on file  . Intimate partner violence:    Fear of current or ex partner: Not on file    Emotionally abused: Not on file    Physically abused: Not on file    Forced sexual activity: Not on file  Other Topics Concern  . Not on file  Social History Narrative  . Not on file    Family History  Problem Relation Age of Onset  . Diabetes Father    Review of Systems Pertinent negatives included in HPI Patient Active Problem List   Diagnosis Date Noted  . Hyperglycemia 03/04/2016  . Diabetes mellitus with complication ([place]) 41/32/4401  . Ketonuria 03/04/2016  . Generalized weakness 03/04/2016  . Hypokalemia   . Diabetes mellitus without complication ([place]) 02/72/5366    No Known Allergies  Prior to Admission medications   Medication Sig Start Date End Date Taking? Authorizing Provider  BD INSULIN SYRINGE U/F 31G X 5/16" 0.5 ML MISC USE WITH INSULIN TWICE DAILY 06/26/17  Yes Scot Jun, FNP  Biotin  w/ Vitamins C & E (HAIR/SKIN/NAILS PO) Take 1 tablet by mouth daily as needed.   Yes [provider]  insulin aspart protamine- aspart (NOVOLOG MIX 70/30) (70-30) 100 UNIT/ML injection Inject 0.2 mLs (20 Units total) into the skin 2 (two) times daily with a meal. 08/27/17  Yes Scot Jun, FNP  Insulin Glargine (LANTUS SOLOSTAR) 100 UNIT/ML Solostar Pen Inject 20 Units into the skin daily at 10 pm. 09/03/17  Yes Scot Jun, FNP  Insulin Pen Needle (BD ULTRA-FINE PEN NEEDLES) 29G X 12.7MM MISC ICD10 E11.9 for use with insulin administration 09/03/17  Yes Scot Jun, FNP  Insulin Syringe 27G X 1/2" 0.5 ML MISC 1 each by Does not apply route 2 (two) times daily before a meal. 11/11/16  Yes Scot Jun, FNP   lisinopril (PRINIVIL,ZESTRIL) 5 MG tablet Take 1 tablet (5 mg total) by mouth daily. 09/03/17  Yes Scot Jun, FNP  metFORMIN (GLUCOPHAGE) 1000 MG tablet Take 1 tablet (1,000 mg total) by mouth 2 (two) times daily with a meal. 09/03/17  Yes Scot Jun, FNP    Past Medical, Surgical Family and Social History reviewed and updated.    Objective:   Today's Vitals   10/15/17 1100  BP: 118/60  Pulse: 82  Temp: 98.6 F (37 C)  TempSrc: Oral  SpO2: 99%  Weight: 173 lb (78.5 kg)  Height: 5\' 7"  (1.702 m)    Wt Readings from Last 3 Encounters:  10/15/17 173 lb (78.5 kg)  09/03/17 182 lb (82.6 kg)  08/19/16 145 lb 3.2 oz (65.9 kg)    Physical Exam Constitutional: Patient appears well-developed and well-nourished. No distress. HENT: Normocephalic, atraumatic, External right and left ear normal.  Eyes: Conjunctivae and EOM are normal. PERRLA, no scleral icterus. Neck: Normal ROM. Neck supple. No JVD. No tracheal deviation. No thyromegaly. CVS: RRR, S1/S2 +, no murmurs, no gallops, no carotid bruit.  Pulmonary: Effort and breath sounds normal, no stridor, rhonchi, wheezes, rales.  Abdominal: Soft. BS +, no distension, tenderness, rebound or guarding.  Musculoskeletal: Normal range of motion. No edema and no tenderness.  Lymphadenopathy: No lymphadenopathy noted, cervical, inguinal or axillary Neuro: Alert and oriented. Muscle tone coordination. No cranial nerve deficit. Skin: Skin is warm and dry. No rash noted. Not diaphoretic. No erythema. No pallor. Psychiatric: Normal mood and affect. Behavior, judgment, thought content normal.    Assessment & Plan:  1. Diabetes mellitus without complication ([place]), F0Y today 9.6. Improved within the prior 6 weeks. Educated patient on the necessity of eating appropriate meals with administration of insulin and oral diabetes medication. Patient admits to inconsistently taking metformin therefore I will defer increasing Lantus at present.  Continue current plan and incorporate some form of physical activity to facilitate weight loss.     RTC: 4 weeks for repeat A1C and medication adjustment if warranted   Carroll Sage. [name], MSN, FNP-C [facility]  [address] [id number] [place], [place] [address] [phone]

## 2017-11-14 ENCOUNTER — Ambulatory Visit: Payer: BLUE CROSS/BLUE SHIELD | Admitting: Family Medicine

## 2018-04-07 ENCOUNTER — Encounter: Payer: Self-pay | Admitting: Family Medicine

## 2018-04-07 ENCOUNTER — Ambulatory Visit (INDEPENDENT_AMBULATORY_CARE_PROVIDER_SITE_OTHER): Payer: Self-pay | Admitting: Family Medicine

## 2018-04-07 ENCOUNTER — Other Ambulatory Visit: Payer: Self-pay | Admitting: Family Medicine

## 2018-04-07 VITALS — BP 116/66 | HR 77 | Temp 98.3°F | Ht 67.0 in | Wt 160.2 lb

## 2018-04-07 DIAGNOSIS — E119 Type 2 diabetes mellitus without complications: Secondary | ICD-10-CM

## 2018-04-07 DIAGNOSIS — R8290 Unspecified abnormal findings in urine: Secondary | ICD-10-CM

## 2018-04-07 DIAGNOSIS — Z09 Encounter for follow-up examination after completed treatment for conditions other than malignant neoplasm: Secondary | ICD-10-CM

## 2018-04-07 DIAGNOSIS — M545 Low back pain, unspecified: Secondary | ICD-10-CM

## 2018-04-07 LAB — POCT GLYCOSYLATED HEMOGLOBIN (HGB A1C): Hemoglobin A1C: 10.6 % — AB (ref 4.0–5.6)

## 2018-04-07 LAB — POCT URINALYSIS DIP (MANUAL ENTRY)
Bilirubin, UA: NEGATIVE
Glucose, UA: 250 mg/dL — AB
Ketones, POC UA: NEGATIVE mg/dL
Leukocytes, UA: NEGATIVE
Nitrite, UA: NEGATIVE
Protein Ur, POC: NEGATIVE mg/dL
Spec Grav, UA: >=1.03 — AB (ref 1.010–1.025)
Urobilinogen, UA: 0.2 E.U./dL
pH, UA: 5.5 (ref 5.0–8.0)

## 2018-04-07 MED ORDER — GLIPIZIDE 10 MG PO TABS: 10.0000 mg | ORAL_TABLET | Freq: Two times a day (BID) | ORAL | 3 refills | Status: DC

## 2018-04-07 MED ORDER — LISINOPRIL 5 MG PO TABS: 5.0000 mg | ORAL_TABLET | Freq: Every day | ORAL | 3 refills | Status: DC

## 2018-04-07 MED ORDER — INSULIN GLARGINE 100 UNIT/ML SOLOSTAR PEN: 30.0000 [IU] | PEN_INJECTOR | Freq: Every day | SUBCUTANEOUS | 99 refills | Status: DC

## 2018-04-07 MED FILL — !LANTUS SOLOSTAR 100UNITS/M: 100 | 30 days supply | Qty: 9 | Fill #0

## 2018-04-07 MED FILL — glipiZIDE 10 MG TABS: 10 | 30 days supply | Qty: 60 | Fill #0

## 2018-04-07 MED FILL — LISINOPRIL 5 MG TAB: 5 | 30 days supply | Qty: 30 | Fill #0

## 2018-04-07 NOTE — Patient Instructions (Addendum)
Glipizide tablets What is this medicine? GLIPIZIDE (GLIP i zide) helps to treat type 2 diabetes. Treatment is combined with diet and exercise. The medicine helps your body to use insulin better. This medicine may be used for other purposes; ask your health care provider or pharmacist if you have questions. COMMON BRAND NAME(S): Glucotrol What should I tell my health care provider before I take this medicine? They need to know if you have any of these conditions: -diabetic ketoacidosis -glucose-6-phosphate dehydrogenase deficiency -heart disease -kidney disease -liver disease -porphyria -severe infection or injury -thyroid disease -an unusual or allergic reaction to glipizide, sulfa drugs, other medicines, foods, dyes, or preservatives -pregnant or trying to get pregnant -breast-feeding How should I use this medicine? Take this medicine by mouth. Swallow with a drink of water. Do not take with food. Take it 30 minutes before a meal. Follow the directions on the prescription label. If you take this medicine once a day, take it 30 minutes before breakfast. Take your doses at the same time each day. Do not take more often than directed. Talk to your pediatrician regarding the use of this medicine in children. Special care may be needed. Elderly patients over 64 years old may have a stronger reaction and need a smaller dose. Overdosage: If you think you have taken too much of this medicine contact a poison control center or emergency room at once. NOTE: This medicine is only for you. Do not share this medicine with others. What if I miss a dose? If you miss a dose, take it as soon as you can. If it is almost time for your next dose, take only that dose. Do not take double or extra doses. What may interact with this medicine? -bosentan -chloramphenicol -cisapride -clarithromycin -medicines for fungal or yeast infections -metoclopramide -probenecid -warfarin Many medications may cause an  increase or decrease in blood sugar, these include: -alcohol containing beverages -aspirin and aspirin-like drugs -chloramphenicol -chromium -diuretics -female hormones, like estrogens or progestins and birth control pills -heart medicines -isoniazid -female hormones or anabolic steroids -medicines for weight loss -medicines for allergies, asthma, cold, or cough -medicines for mental problems -medicines called MAO Inhibitors like Nardil, Parnate, Marplan, Eldepryl -niacin -NSAIDs, medicines for pain and inflammation, like ibuprofen or naproxen -pentamidine -phenytoin -probenecid -quinolone antibiotics like ciprofloxacin, levofloxacin, ofloxacin -some herbal dietary supplements -steroid medicines like prednisone or cortisone -thyroid medicine This list may not describe all possible interactions. Give your health care provider a list of all the medicines, herbs, non-prescription drugs, or dietary supplements you use. Also tell them if you smoke, drink alcohol, or use illegal drugs. Some items may interact with your medicine. What should I watch for while using this medicine? Visit your doctor or health care professional for regular checks on your progress. A test called the HbA1C (A1C) will be monitored. This is a simple blood test. It measures your blood sugar control over the last 2 to 3 months. You will receive this test every 3 to 6 months. Learn how to check your blood sugar. Learn the symptoms of low and high blood sugar and how to manage them. Always carry a quick-source of sugar with you in case you have symptoms of low blood sugar. Examples include hard sugar candy or glucose tablets. Make sure others know that you can choke if you eat or drink when you develop serious symptoms of low blood sugar, such as seizures or unconsciousness. They must get medical help at once. Tell your doctor or health  care professional if you have high blood sugar. You might need to change the dose of  your medicine. If you are sick or exercising more than usual, you might need to change the dose of your medicine. Do not skip meals. Ask your doctor or health care professional if you should avoid alcohol. Many nonprescription cough and cold products contain sugar or alcohol. These can affect blood sugar. This medicine can make you more sensitive to the sun. Keep out of the sun. If you cannot avoid being in the sun, wear protective clothing and use sunscreen. Do not use sun lamps or tanning beds/booths. Wear a medical ID bracelet or chain, and carry a card that describes your disease and details of your medicine and dosage times. What side effects may I notice from receiving this medicine? Side effects that you should report to your doctor or health care professional as soon as possible: -allergic reactions like skin rash, itching or hives, swelling of the face, lips, or tongue -breathing problems -dark urine -fever, chills, sore throat -signs and symptoms of low blood sugar such as feeling anxious, confusion, dizziness, increased hunger, unusually weak or tired, sweating, shakiness, cold, irritable, headache, blurred vision, fast heartbeat, loss of consciousness -unusual bleeding or bruising -yellowing of the eyes or skin Side effects that usually do not require medical attention (report to your doctor or health care professional if they continue or are bothersome): -diarrhea -dizziness -headache -heartburn -nausea -stomach gas This list may not describe all possible side effects. Call your doctor for medical advice about side effects. You may report side effects to FDA at 1-800-FDA-1088. Where should I keep my medicine? Keep out of the reach of children. Store at room temperature below 30 degrees C (86 degrees F). Throw away any unused medicine after the expiration date. NOTE: This sheet is a summary. It may not cover all possible information. If you have questions about this medicine, talk  to your doctor, pharmacist, or health care provider.  2018 Elsevier/Gold Standard (2012-10-14 14:42:46)      Insulin Glargine injection What is this medicine? INSULIN GLARGINE (IN su lin GLAR geen) is a human-made form of insulin. This drug lowers the amount of sugar in your blood. It is a long-acting insulin that is usually given once a day. This medicine may be used for other purposes; ask your health care provider or pharmacist if you have questions. COMMON BRAND NAME(S): BASAGLAR, Lantus, Lantus SoloStar, Toujeo SoloStar What should I tell my health care provider before I take this medicine? They need to know if you have any of these conditions: -episodes of low blood sugar -kidney disease -liver disease -an unusual or allergic reaction to insulin, metacresol, other medicines, foods, dyes, or preservatives -pregnant or trying to get pregnant -breast-feeding How should I use this medicine? This medicine is for injection under the skin. Use this medicine at the same time each day. Use exactly as directed. This insulin should never be mixed in the same syringe with other insulins before injection. Do not vigorously shake before use. You will be taught how to use this medicine and how to adjust doses for activities and illness. Do not use more insulin than prescribed. Always check the appearance of your insulin before using it. This medicine should be clear and colorless like water. Do not use it if it is cloudy, thickened, colored, or has solid particles in it. It is important that you put your used needles and syringes in a special sharps container. Do  not put them in a trash can. If you do not have a sharps container, call your pharmacist or healthcare provider to get one. Talk to your pediatrician regarding the use of this medicine in children. Special care may be needed. Overdosage: If you think you have taken too much of this medicine contact a poison control center or emergency room  at once. NOTE: This medicine is only for you. Do not share this medicine with others. What if I miss a dose? It is important not to miss a dose. Your health care professional or doctor should discuss a plan for missed doses with you. If you do miss a dose, follow their plan. Do not take double doses. What may interact with this medicine? -other medicines for diabetes Many medications may cause changes in blood sugar, these include: -alcohol containing beverages -antiviral medicines for HIV or AIDS -aspirin and aspirin-like drugs -certain medicines for blood pressure, heart disease, irregular heart beat -chromium -diuretics -female hormones, such as estrogens or progestins, birth control pills -fenofibrate -gemfibrozil -isoniazid -lanreotide -female hormones or anabolic steroids -MAOIs like Carbex, Eldepryl, Marplan, Nardil, and Parnate -medicines for weight loss -medicines for allergies, asthma, cold, or cough -medicines for depression, anxiety, or psychotic disturbances -niacin -nicotine -NSAIDs, medicines for pain and inflammation, like ibuprofen or naproxen -octreotide -pasireotide -pentamidine -phenytoin -probenecid -quinolone antibiotics such as ciprofloxacin, levofloxacin, ofloxacin -some herbal dietary supplements -steroid medicines such as prednisone or cortisone -sulfamethoxazole; trimethoprim -thyroid hormones Some medications can hide the warning symptoms of low blood sugar (hypoglycemia). You may need to monitor your blood sugar more closely if you are taking one of these medications. These include: -beta-blockers, often used for high blood pressure or heart problems (examples include atenolol, metoprolol, propranolol) -clonidine -guanethidine -reserpine This list may not describe all possible interactions. Give your health care provider a list of all the medicines, herbs, non-prescription drugs, or dietary supplements you use. Also tell them if you smoke, drink  alcohol, or use illegal drugs. Some items may interact with your medicine. What should I watch for while using this medicine? Visit your health care professional or doctor for regular checks on your progress. Do not drive, use machinery, or do anything that needs mental alertness until you know how this medicine affects you. Alcohol may interfere with the effect of this medicine. Avoid alcoholic drinks. A test called the HbA1C (A1C) will be monitored. This is a simple blood test. It measures your blood sugar control over the last 2 to 3 months. You will receive this test every 3 to 6 months. Learn how to check your blood sugar. Learn the symptoms of low and high blood sugar and how to manage them. Always carry a quick-source of sugar with you in case you have symptoms of low blood sugar. Examples include hard sugar candy or glucose tablets. Make sure others know that you can choke if you eat or drink when you develop serious symptoms of low blood sugar, such as seizures or unconsciousness. They must get medical help at once. Tell your doctor or health care professional if you have high blood sugar. You might need to change the dose of your medicine. If you are sick or exercising more than usual, you might need to change the dose of your medicine. Do not skip meals. Ask your doctor or health care professional if you should avoid alcohol. Many nonprescription cough and cold products contain sugar or alcohol. These can affect blood sugar. Make sure that you have the right kind  of syringe for the type of insulin you use. Try not to change the brand and type of insulin or syringe unless your health care professional or doctor tells you to. Switching insulin brand or type can cause dangerously high or low blood sugar. Always keep an extra supply of insulin, syringes, and needles on hand. Use a syringe one time only. Throw away syringe and needle in a closed container to prevent accidental needle sticks. Insulin  pens and cartridges should never be shared. Even if the needle is changed, sharing may result in passing of viruses like hepatitis or HIV. Wear a medical ID bracelet or chain, and carry a card that describes your disease and details of your medicine and dosage times. What side effects may I notice from receiving this medicine? Side effects that you should report to your doctor or health care professional as soon as possible: -allergic reactions like skin rash, itching or hives, swelling of the face, lips, or tongue -breathing problems -signs and symptoms of high blood sugar such as dizziness, dry mouth, dry skin, fruity breath, nausea, stomach pain, increased hunger or thirst, increased urination -signs and symptoms of low blood sugar such as feeling anxious, confusion, dizziness, increased hunger, unusually weak or tired, sweating, shakiness, cold, irritable, headache, blurred vision, fast heartbeat, loss of consciousness Side effects that usually do not require medical attention (report to your doctor or health care professional if they continue or are bothersome): -increase or decrease in fatty tissue under the skin due to overuse of a particular injection site -itching, burning, swelling, or rash at site where injected This list may not describe all possible side effects. Call your doctor for medical advice about side effects. You may report side effects to FDA at 1-800-FDA-1088. Where should I keep my medicine? Keep out of the reach of children. Store unopened vials in a refrigerator between 2 and 8 degrees C (36 and 46 degrees F). Do not freeze or use if the insulin has been frozen. Opened vials (vials currently in use) may be stored in the refrigerator or at room temperature, at approximately 25 degrees C (77 degrees F) or cooler. Keeping your insulin at room temperature decreases the amount of pain during injection. Once opened, your insulin can be used for 28 days. After 28 days, the vial  should be thrown away. Store Lantus [employer], mining in a refrigerator between 2 and 8 degrees C (36 and 46 degrees F) or at room temperature below 30 degrees C (86 degrees F). Do not freeze or use if the insulin has been frozen. Once opened, the pens should be kept at room temperature. Do not store in the refrigerator once opened. Once opened, the insulin can be used for 28 days. After 28 days, the Lantus Solostar Pen or Basaglar KwikPen should be thrown away. Store [employer] in a refrigerator between 2 and 8 degrees C (36 and 46 degrees F). Do not freeze or use if the insulin has been frozen. Once opened, the pens should be kept at room temperature below 30 degrees C (86 degrees F). Do not store in the refrigerator once opened. Once opened, the insulin can be used for 42 days. After 42 days, the [employer] Pen should be thrown away. Protect from light and excessive heat. Throw away any unused medicine after the expiration date or after the specified time for room temperature storage has passed. NOTE: This sheet is a summary. It may not cover all possible information.  If you have questions about this medicine, talk to your doctor, pharmacist, or health care provider.  2018 Elsevier/Gold Standard (2016-07-17 10:26:25) Lisinopril oral solution What is this medicine? LISINOPRIL (lyse IN oh pril) is an ACE inhibitor. This medicine is used to treat high blood pressure and heart failure. It is also used to protect the heart immediately after a heart attack. This medicine may be used for other purposes; ask your health care provider or pharmacist if you have questions. COMMON BRAND NAME(S): QBRELIS What should I tell my health care provider before I take this medicine? They need to know if you have any of these conditions: -diabetes -heart or blood vessel disease -kidney disease -low blood pressure -previous swelling of the tongue, face, or lips with difficulty  breathing, difficulty swallowing, hoarseness, or tightening of the throat -an unusual or allergic reaction to lisinopril, other ACE inhibitors, insect venom, food, dyes, or preservatives -pregnant or trying to get pregnant -breast-feeding How should I use this medicine? Take this medicine by mouth. Follow the directions on the prescription label. Use a specially marked spoon or container to measure each dose. Ask your pharmacist if you do not have one. Household spoons are not accurate. You may take this medicine with or without food. If it upsets your stomach, take it with food. Take your medicine at regular intervals. Do not take it more often than directed. Do not stop taking except on your doctor's advice. Talk to your pediatrician regarding the use of this medicine in children. While this drug may be prescribed for children as young as 6 years for selected conditions, precautions do apply. Overdosage: If you think you have taken too much of this medicine contact a poison control center or emergency room at once. NOTE: This medicine is only for you. Do not share this medicine with others. What if I miss a dose? If you miss a dose, take it as soon as you can. If it is almost time for your next dose, take only that dose. Do not take double or extra doses. What may interact with this medicine? Do not take this medicine with any of the following medications: -hymenoptera venom -sacubitril; valsartan This medicines may also interact with the following medications: -aliskiren -angiotensin receptor blockers, like losartan or valsartan -certain medicines for diabetes -diuretics -everolimus -gold compounds -lithium -NSAIDs, medicines for pain and inflammation, like ibuprofen or naproxen -potassium salts or supplements -salt substitutes -sirolimus -temsirolimus This list may not describe all possible interactions. Give your health care provider a list of all the medicines, herbs,  non-prescription drugs, or dietary supplements you use. Also tell them if you smoke, drink alcohol, or use illegal drugs. Some items may interact with your medicine. What should I watch for while using this medicine? Visit your doctor or health care professional for regular check ups. Check your blood pressure as directed. Ask your doctor what your blood pressure should be, and when you should contact him or her. Do not treat yourself for coughs, colds, or pain while you are using this medicine without asking your doctor or health care professional for advice. Some ingredients may increase your blood pressure. Women should inform their doctor if they wish to become pregnant or think they might be pregnant. There is a potential for serious side effects to an unborn child. Talk to your health care professional or pharmacist for more information. Check with your doctor or health care professional if you get an attack of severe diarrhea, nausea and vomiting, or  if you sweat a lot. The loss of too much body fluid can make it dangerous for you to take this medicine. You may get drowsy or dizzy. Do not drive, use machinery, or do anything that needs mental alertness until you know how this drug affects you. Do not stand or sit up quickly, especially if you are an older patient. This reduces the risk of dizzy or fainting spells. Alcohol can make you more drowsy and dizzy. Avoid alcoholic drinks. Avoid salt substitutes unless you are told otherwise by your doctor or health care professional. What side effects may I notice from receiving this medicine? Side effects that you should report to your doctor or health care professional as soon as possible: -allergic reactions like skin rash, itching or hives, swelling of the hands, feet, face, lips, throat, or tongue -breathing problems -signs and symptoms of kidney injury like trouble passing urine or change in the amount of urine -signs and symptoms of increased  potassium like muscle weakness; chest pain; or fast, irregular heartbeat -signs and symptoms of liver injury like dark yellow or brown urine; general ill feeling or flu-like symptoms; light-colored stools; loss of appetite; nausea; right upper belly pain; unusually weak or tired; yellowing of the eyes or skin -signs and symptoms of low blood pressure like dizziness; feeling faint or lightheaded, falls; unusually weak or tired -stomach pain with or without nausea and vomiting Side effects that usually do not require medical attention (report to your doctor or health care professional if they continue or are bothersome): -changes in taste -cough -dizziness -fever -headache -sensitivity to light This list may not describe all possible side effects. Call your doctor for medical advice about side effects. You may report side effects to FDA at 1-800-FDA-1088. Where should I keep my medicine? Keep out of the reach of children. Store at room temperature between 20 and 25 degrees C (68 to 77 degrees F). Thrown away any unused medicine after the expiration date. NOTE: This sheet is a summary. It may not cover all possible information. If you have questions about this medicine, talk to your doctor, pharmacist, or health care provider.  2018 Elsevier/Gold Standard (2015-08-25 11:41:14)      DASH Eating Plan DASH stands for "Dietary Approaches to Stop Hypertension." The DASH eating plan is a healthy eating plan that has been shown to reduce high blood pressure (hypertension). It may also reduce your risk for type 2 diabetes, heart disease, and stroke. The DASH eating plan may also help with weight loss. What are tips for following this plan? General guidelines  Avoid eating more than 2,300 mg (milligrams) of salt (sodium) a day. If you have hypertension, you may need to reduce your sodium intake to 1,500 mg a day.  Limit alcohol intake to no more than 1 drink a day for nonpregnant women and 2 drinks  a day for men. One drink equals 12 oz of beer, 5 oz of wine, or 1 oz of hard liquor.  Work with your health care provider to maintain a healthy body weight or to lose weight. Ask what an ideal weight is for you.  Get at least 30 minutes of exercise that causes your heart to beat faster (aerobic exercise) most days of the week. Activities may include walking, swimming, or biking.  Work with your health care provider or diet and nutrition specialist (dietitian) to adjust your eating plan to your individual calorie needs. Reading food labels  Check food labels for the amount of sodium  per serving. Choose foods with less than 5 percent of the Daily Value of sodium. Generally, foods with less than 300 mg of sodium per serving fit into this eating plan.  To find whole grains, look for the word "whole" as the first word in the ingredient list. Shopping  Buy products labeled as "low-sodium" or "no salt added."  Buy fresh foods. Avoid canned foods and premade or frozen meals. Cooking  Avoid adding salt when cooking. Use salt-free seasonings or herbs instead of table salt or sea salt. Check with your health care provider or pharmacist before using salt substitutes.  Do not fry foods. Cook foods using healthy methods such as baking, boiling, grilling, and broiling instead.  Cook with heart-healthy oils, such as olive, canola, soybean, or sunflower oil. Meal planning   Eat a balanced diet that includes: ? 5 or more servings of fruits and vegetables each day. At each meal, try to fill half of your plate with fruits and vegetables. ? Up to 6-8 servings of whole grains each day. ? Less than 6 oz of lean meat, poultry, or fish each day. A 3-oz serving of meat is about the same size as a deck of cards. One egg equals 1 oz. ? 2 servings of low-fat dairy each day. ? A serving of nuts, seeds, or beans 5 times each week. ? Heart-healthy fats. Healthy fats called Omega-3 fatty acids are found in foods  such as flaxseeds and coldwater fish, like sardines, salmon, and mackerel.  Limit how much you eat of the following: ? Canned or prepackaged foods. ? Food that is high in trans fat, such as fried foods. ? Food that is high in saturated fat, such as fatty meat. ? Sweets, desserts, sugary drinks, and other foods with added sugar. ? Full-fat dairy products.  Do not salt foods before eating.  Try to eat at least 2 vegetarian meals each week.  Eat more home-cooked food and less restaurant, buffet, and fast food.  When eating at a restaurant, ask that your food be prepared with less salt or no salt, if possible. What foods are recommended? The items listed may not be a complete list. Talk with your dietitian about what dietary choices are best for you. Grains Whole-grain or whole-wheat bread. Whole-grain or whole-wheat pasta. Brown rice. [place]. Bulgur. Whole-grain and low-sodium cereals. Pita bread. Low-fat, low-sodium crackers. Whole-wheat flour tortillas. Vegetables Fresh or frozen vegetables (raw, steamed, roasted, or grilled). Low-sodium or reduced-sodium tomato and vegetable juice. Low-sodium or reduced-sodium tomato sauce and tomato paste. Low-sodium or reduced-sodium canned vegetables. Fruits All fresh, dried, or frozen fruit. Canned fruit in natural juice (without added sugar). Meat and other protein foods Skinless chicken or [place]. Ground chicken or [place]. Pork with fat trimmed off. Fish and seafood. Egg whites. Dried beans, peas, or lentils. Unsalted nuts, nut butters, and seeds. Unsalted canned beans. Lean cuts of beef with fat trimmed off. Low-sodium, lean deli meat. Dairy Low-fat (1%) or fat-free (skim) milk. Fat-free, low-fat, or reduced-fat cheeses. Nonfat, low-sodium ricotta or cottage cheese. Low-fat or nonfat yogurt. Low-fat, low-sodium cheese. Fats and oils Soft margarine without trans fats. Vegetable oil. Low-fat, reduced-fat, or light mayonnaise and salad  dressings (reduced-sodium). Canola, safflower, olive, soybean, and sunflower oils. Avocado. Seasoning and other foods Herbs. Spices. Seasoning mixes without salt. Unsalted popcorn and pretzels. Fat-free sweets. What foods are not recommended? The items listed may not be a complete list. Talk with your dietitian about what dietary choices are best for you.  Grains Baked goods made with fat, such as croissants, muffins, or some breads. Dry pasta or rice meal packs. Vegetables Creamed or fried vegetables. Vegetables in a cheese sauce. Regular canned vegetables (not low-sodium or reduced-sodium). Regular canned tomato sauce and paste (not low-sodium or reduced-sodium). Regular tomato and vegetable juice (not low-sodium or reduced-sodium). Angie Fava. Olives. Fruits Canned fruit in a light or heavy syrup. Fried fruit. Fruit in cream or butter sauce. Meat and other protein foods Fatty cuts of meat. Ribs. Fried meat. Berniece Salines. Sausage. Bologna and other processed lunch meats. Salami. Fatback. Hotdogs. Bratwurst. Salted nuts and seeds. Canned beans with added salt. Canned or smoked fish. Whole eggs or egg yolks. Chicken or [place] with skin. Dairy Whole or 2% milk, cream, and half-and-half. Whole or full-fat cream cheese. Whole-fat or sweetened yogurt. Full-fat cheese. Nondairy creamers. Whipped toppings. Processed cheese and cheese spreads. Fats and oils Butter. Stick margarine. Lard. Shortening. Ghee. Bacon fat. Tropical oils, such as coconut, palm kernel, or palm oil. Seasoning and other foods Salted popcorn and pretzels. Onion salt, garlic salt, seasoned salt, table salt, and sea salt. Worcestershire sauce. Tartar sauce. Barbecue sauce. Teriyaki sauce. Soy sauce, including reduced-sodium. Steak sauce. Canned and packaged gravies. Fish sauce. Oyster sauce. Cocktail sauce. Horseradish that you find on the shelf. Ketchup. Mustard. Meat flavorings and tenderizers. Bouillon cubes. Hot sauce and Tabasco sauce.  Premade or packaged marinades. Premade or packaged taco seasonings. Relishes. Regular salad dressings. Where to find more information:  National Heart, Lung, and [place]: https://wilson-eaton.com/  American Heart Association: www.heart.org Summary  The DASH eating plan is a healthy eating plan that has been shown to reduce high blood pressure (hypertension). It may also reduce your risk for type 2 diabetes, heart disease, and stroke.  With the DASH eating plan, you should limit salt (sodium) intake to 2,300 mg a day. If you have hypertension, you may need to reduce your sodium intake to 1,500 mg a day.  When on the DASH eating plan, aim to eat more fresh fruits and vegetables, whole grains, lean proteins, low-fat dairy, and heart-healthy fats.  Work with your health care provider or diet and nutrition specialist (dietitian) to adjust your eating plan to your individual calorie needs. This information is not intended to replace advice given to you by your health care provider. Make sure you discuss any questions you have with your health care provider. Document Released: 06/20/2011 Document Revised: 06/24/2016 Document Reviewed: 06/24/2016 Elsevier Interactive Patient Education  2018 [place] Heart-healthy meal planning includes:  Limiting unhealthy fats.  Increasing healthy fats.  Making other small dietary changes.  You may need to talk with your doctor or a diet specialist (dietitian) to create an eating plan that is right for you. What types of fat should I choose?  Choose healthy fats. These include olive oil and canola oil, flaxseeds, walnuts, almonds, and seeds.  Eat more omega-3 fats. These include salmon, mackerel, sardines, tuna, flaxseed oil, and ground flaxseeds. Try to eat fish at least twice each week.  Limit saturated fats. ? Saturated fats are often found in animal products, such as meats, butter, and cream. ? Plant sources of  saturated fats include palm oil, palm kernel oil, and coconut oil.  Avoid foods with partially hydrogenated oils in them. These include stick margarine, some tub margarines, cookies, crackers, and other baked goods. These contain trans fats. What general guidelines do I need to follow?  Check food labels carefully. Identify foods with  trans fats or high amounts of saturated fat.  Fill one half of your plate with vegetables and green salads. Eat 4-5 servings of vegetables per day. A serving of vegetables is: ? 1 cup of raw leafy vegetables. ?  cup of raw or cooked cut-up vegetables. ?  cup of vegetable juice.  Fill one fourth of your plate with whole grains. Look for the word "whole" as the first word in the ingredient list.  Fill one fourth of your plate with lean protein foods.  Eat 4-5 servings of fruit per day. A serving of fruit is: ? One medium whole fruit. ?  cup of dried fruit. ?  cup of fresh, frozen, or canned fruit. ?  cup of 100% fruit juice.  Eat more foods that contain soluble fiber. These include apples, broccoli, carrots, beans, peas, and barley. Try to get 20-30 g of fiber per day.  Eat more home-cooked food. Eat less restaurant, buffet, and fast food.  Limit or avoid alcohol.  Limit foods high in starch and sugar.  Avoid fried foods.  Avoid frying your food. Try baking, boiling, grilling, or broiling it instead. You can also reduce fat by: ? Removing the skin from poultry. ? Removing all visible fats from meats. ? Skimming the fat off of stews, soups, and gravies before serving them. ? Steaming vegetables in water or broth.  Lose weight if you are overweight.  Eat 4-5 servings of nuts, legumes, and seeds per week: ? One serving of dried beans or legumes equals  cup after being cooked. ? One serving of nuts equals 1 ounces. ? One serving of seeds equals  ounce or one tablespoon.  You may need to keep track of how much salt or sodium you eat. This  is especially true if you have high blood pressure. Talk with your doctor or dietitian to get more information. What foods can I eat? Grains Breads, including [place], white, pita, wheat, raisin, rye, oatmeal, and [place]. Tortillas that are neither fried nor made with lard or trans fat. Low-fat rolls, including hotdog and hamburger buns and English muffins. Biscuits. Muffins. Waffles. Pancakes. Light popcorn. Whole-grain cereals. Flatbread. Melba toast. Pretzels. Breadsticks. Rusks. Low-fat snacks. Low-fat crackers, including oyster, saltine, matzo, graham, animal, and rye. Rice and pasta, including brown rice and pastas that are made with whole wheat. Vegetables All vegetables. Fruits All fruits, but limit coconut. Meats and Other Protein Sources Lean, well-trimmed beef, veal, pork, and lamb. Chicken and [place] without skin. All fish and shellfish. Wild duck, rabbit, pheasant, and venison. Egg whites or low-cholesterol egg substitutes. Dried beans, peas, lentils, and tofu. Seeds and most nuts. Dairy Low-fat or nonfat cheeses, including ricotta, string, and mozzarella. Skim or 1% milk that is liquid, powdered, or evaporated. Buttermilk that is made with low-fat milk. Nonfat or low-fat yogurt. Beverages Mineral water. Diet carbonated beverages. Sweets and Desserts Sherbets and fruit ices. Honey, jam, marmalade, jelly, and syrups. Meringues and gelatins. Pure sugar candy, such as hard candy, jelly beans, gumdrops, mints, marshmallows, and small amounts of dark chocolate. [employer] Eat all sweets and desserts in moderation. Fats and Oils Nonhydrogenated (trans-free) margarines. Vegetable oils, including soybean, sesame, sunflower, olive, peanut, safflower, corn, canola, and cottonseed. Salad dressings or mayonnaise made with a vegetable oil. Limit added fats and oils that you use for cooking, baking, salads, and as spreads. Other Cocoa powder. Coffee and tea. All seasonings and  condiments. The items listed above may not be a complete list of recommended  foods or beverages. Contact your dietitian for more options. What foods are not recommended? Grains Breads that are made with saturated or trans fats, oils, or whole milk. Croissants. Butter rolls. Cheese breads. Sweet rolls. Donuts. Buttered popcorn. Chow mein noodles. High-fat crackers, such as cheese or butter crackers. Meats and Other Protein Sources Fatty meats, such as hotdogs, short ribs, sausage, spareribs, bacon, rib eye roast or steak, and mutton. High-fat deli meats, such as salami and bologna. Caviar. Domestic duck and goose. Organ meats, such as kidney, liver, sweetbreads, and heart. Dairy Cream, sour cream, cream cheese, and creamed cottage cheese. Whole-milk cheeses, including blue (bleu), Monte[place] Jack, [place], [place], American, [place], Swiss, cheddar, [place], and [place]. Whole or 2% milk that is liquid, evaporated, or condensed. Whole buttermilk. Cream sauce or high-fat cheese sauce. Yogurt that is made from whole milk. Beverages Regular sodas and juice drinks with added sugar. Sweets and Desserts Frosting. Pudding. Cookies. Cakes other than angel food cake. Candy that has milk chocolate or white chocolate, hydrogenated fat, butter, coconut, or unknown ingredients. Buttered syrups. Full-fat ice cream or ice cream drinks. Fats and Oils Gravy that has suet, meat fat, or shortening. Cocoa butter, hydrogenated oils, palm oil, coconut oil, palm kernel oil. These can often be found in baked products, candy, fried foods, nondairy creamers, and whipped toppings. Solid fats and shortenings, including bacon fat, salt pork, lard, and butter. Nondairy cream substitutes, such as coffee creamers and sour cream substitutes. Salad dressings that are made of unknown oils, cheese, or sour cream. The items listed above may not be a complete list of foods and beverages to avoid. Contact your dietitian for more  information. This information is not intended to replace advice given to you by your health care provider. Make sure you discuss any questions you have with your health care provider. Document Released: 12/31/2011 Document Revised: 12/07/2015 Document Reviewed: 12/23/2013 Elsevier Interactive Patient Education  [employer].

## 2018-04-07 NOTE — Progress Notes (Signed)
Follow Up  Subjective:    Patient ID: Tina Brown, female    DOB: Nov 27, 1985, 32 y.o.   MRN: [id number]  No chief complaint on file.  HPI  [name] is a 32 year old female with a past medical history of Diabetes. She is here today for follow up. Visual Arabic Language Interpreter Line will be used at this visit.   Current Status: Since her last office visit, she is doing well with c/o moderate bilateral low back pain. She is very tearful today because of her diagnosis of Diabetes.   She denies visual changes, cough, chest pain, heart palpitations, shortness of breath, and falls. She has occasionally headaches and dizziness with position changes. Denies severe headaches, confusion, seizures, double vision, and blurred vision, nausea and vomiting.  She denies fevers, chills, fatigue, recent infections, weight loss, and night sweats. No reports of GI problems such as nausea, vomiting, diarrhea, and constipation. She has no reports of blood in stools, dysuria and hematuria. No depression or anxiety reported.  Past Medical History:  Diagnosis Date  . Diabetes mellitus without complication ([place])     Family History  Problem Relation Age of Onset  . Diabetes Father     Social History   Socioeconomic History  . Marital status: Married    Spouse name: Not on file  . Number of children: Not on file  . Years of education: Not on file  . Highest education level: Not on file  Occupational History  . Not on file  Social Needs  . Financial resource strain: Not on file  . Food insecurity:    Worry: Not on file    Inability: Not on file  . Transportation needs:    Medical: Not on file    Non-medical: Not on file  Tobacco Use  . Smoking status: Never Smoker  . Smokeless tobacco: Never Used  Substance and Sexual Activity  . Alcohol use: No  . Drug use: No  . Sexual activity: Not on file  Lifestyle  . Physical activity:    Days per week: Not on file    Minutes per session: Not on  file  . Stress: Not on file  Relationships  . Social connections:    Talks on phone: Not on file    Gets together: Not on file    Attends religious service: Not on file    Active member of club or organization: Not on file    Attends meetings of clubs or organizations: Not on file    Relationship status: Not on file  . Intimate partner violence:    Fear of current or ex partner: Not on file    Emotionally abused: Not on file    Physically abused: Not on file    Forced sexual activity: Not on file  Other Topics Concern  . Not on file  Social History Narrative  . Not on file    History reviewed. No pertinent surgical history.  Immunization History  Administered Date(s) Administered  . Influenza,inj,Quad PF,6+ Mos 04/19/2016, 04/13/2017  . Tdap 04/19/2016    Current Meds  Medication Sig  . BD INSULIN SYRINGE U/F 31G X 5/16" 0.5 ML MISC USE WITH INSULIN TWICE DAILY  . Insulin Glargine (LANTUS SOLOSTAR) 100 UNIT/ML Solostar Pen Inject 20 Units into the skin daily at 10 pm.  . lisinopril (PRINIVIL,ZESTRIL) 5 MG tablet Take 1 tablet (5 mg total) by mouth daily.  . [DISCONTINUED] metFORMIN (GLUCOPHAGE) 1000 MG tablet Take 1 tablet (1,000 mg  total) by mouth 2 (two) times daily with a meal.    No Known Allergies  BP 116/66 (BP Location: Left Arm, Patient Position: Sitting, Cuff Size: Small)   Pulse 77   Temp 98.3 F (36.8 C) (Oral)   Ht 5\' 7"  (1.702 m)   Wt 160 lb 3.2 oz (72.7 kg)   LMP 04/06/2018   SpO2 100%   BMI 25.09 kg/m     Review of Systems  Constitutional: Negative.   HENT: Negative.   Respiratory: Negative.   Cardiovascular: Negative.   Gastrointestinal: Negative.   Endocrine: Positive for polyuria.  Genitourinary: Negative.   Musculoskeletal: Positive for back pain (lower back pain).  Skin: Negative.   Neurological: Negative.   Hematological: Negative.   Psychiatric/Behavioral: Negative.    Objective:   Physical Exam  Constitutional: She is oriented  to person, place, and time. She appears well-developed and well-nourished.  Neck: Normal range of motion. Neck supple.  Cardiovascular: Normal rate, regular rhythm, normal heart sounds and intact distal pulses.  Pulmonary/Chest: Effort normal and breath sounds normal.  Abdominal: Soft. Bowel sounds are normal.  Neurological: She is alert and oriented to person, place, and time.  Skin: Skin is warm and dry.  Psychiatric: She has a normal mood and affect. Her behavior is normal. Judgment and thought content normal.  Nursing note and vitals reviewed.  Assessment & Plan:   1. Diabetes mellitus without complication (HCC) Hgb E0F increased at 10.6 today, from We will increase Lantus to 30 units daily. - POCT glycosylated hemoglobin (Hb A1C) - POCT urinalysis dipstick - glipiZIDE (GLUCOTROL) 10 MG tablet; Take 1 tablet (10 mg total) by mouth 2 (two) times daily before a meal.  Dispense: 60 tablet; Refill: 3 - Insulin Glargine (LANTUS SOLOSTAR) 100 UNIT/ML Solostar Pen; Inject 30 Units into the skin daily.  Dispense: 5 pen; Refill: PRN  2. Bilateral low back pain without sciatica, unspecified chronicity Stable today.   3. Abnormal urinalysis - Urine Culture  4. Follow up He will follow up 1 month.   Meds ordered this encounter  Medications  . glipiZIDE (GLUCOTROL) 10 MG tablet    Sig: Take 1 tablet (10 mg total) by mouth 2 (two) times daily before a meal.    Dispense:  60 tablet    Refill:  3  . Insulin Glargine (LANTUS SOLOSTAR) 100 UNIT/ML Solostar Pen    Sig: Inject 30 Units into the skin daily.    Dispense:  5 pen    Refill:  PRN  . lisinopril (PRINIVIL,ZESTRIL) 5 MG tablet    Sig: Take 1 tablet (5 mg total) by mouth daily.    Dispense:  90 tablet    Refill:  English,  MSN, [facility] [name] [address] [place], [place] [address] [phone]

## 2018-04-09 LAB — URINE CULTURE

## 2018-04-14 ENCOUNTER — Other Ambulatory Visit: Payer: Self-pay

## 2018-04-14 MED ORDER — INSULIN PEN NEEDLE 31G X 5 MM MISC: 30.0000 [IU] | Freq: Every day | 2 refills | Status: DC

## 2018-04-14 MED ORDER — INSULIN PEN NEEDLE 32G X 4 MM MISC: 30.0000 [IU] | Freq: Every day | 2 refills | Status: DC

## 2018-04-14 NOTE — Telephone Encounter (Signed)
nano needles sent

## 2018-04-14 NOTE — Telephone Encounter (Signed)
Correct needles sent

## 2018-04-15 ENCOUNTER — Telehealth: Payer: Self-pay

## 2018-04-15 NOTE — Telephone Encounter (Signed)
-----   Message from Azzie Glatter, FNP sent at 04/14/2018  7:00 PM EDT ----- Regarding: "Urine Culture" Inform patient that results of Urine Culture are negative. No additional antibiotic is needed. Keep follow up appointment as scheduled.    Thank you.

## 2018-04-15 NOTE — Telephone Encounter (Signed)
Left a vm for patient to callback 

## 2018-04-15 NOTE — Telephone Encounter (Signed)
Tried to contact patient at number on file and person stated it was the wrong number. Letter will be mailed out for patient to contact office.

## 2018-04-20 ENCOUNTER — Ambulatory Visit: Payer: Self-pay | Admitting: Family Medicine

## 2018-04-27 ENCOUNTER — Ambulatory Visit: Payer: BLUE CROSS/BLUE SHIELD | Admitting: Family Medicine

## 2018-04-27 ENCOUNTER — Ambulatory Visit: Payer: Self-pay

## 2018-04-30 ENCOUNTER — Ambulatory Visit: Payer: Self-pay | Attending: Family Medicine

## 2018-05-12 ENCOUNTER — Encounter: Payer: Self-pay | Admitting: Family Medicine

## 2018-05-12 ENCOUNTER — Ambulatory Visit (INDEPENDENT_AMBULATORY_CARE_PROVIDER_SITE_OTHER): Payer: Self-pay | Admitting: Family Medicine

## 2018-05-12 VITALS — BP 114/66 | HR 88 | Temp 98.0°F | Ht 67.0 in | Wt 157.0 lb

## 2018-05-12 DIAGNOSIS — Z09 Encounter for follow-up examination after completed treatment for conditions other than malignant neoplasm: Secondary | ICD-10-CM

## 2018-05-12 DIAGNOSIS — R519 Headache, unspecified: Secondary | ICD-10-CM

## 2018-05-12 DIAGNOSIS — E119 Type 2 diabetes mellitus without complications: Secondary | ICD-10-CM

## 2018-05-12 DIAGNOSIS — H538 Other visual disturbances: Secondary | ICD-10-CM

## 2018-05-12 DIAGNOSIS — R51 Headache: Secondary | ICD-10-CM

## 2018-05-12 DIAGNOSIS — Z23 Encounter for immunization: Secondary | ICD-10-CM

## 2018-05-12 LAB — POCT URINALYSIS DIP (MANUAL ENTRY)
Blood, UA: NEGATIVE
Glucose, UA: 250 mg/dL — AB
Ketones, POC UA: NEGATIVE mg/dL
Leukocytes, UA: NEGATIVE
Nitrite, UA: NEGATIVE
Protein Ur, POC: 30 mg/dL — AB
Spec Grav, UA: >=1.03 — AB (ref 1.010–1.025)
Urobilinogen, UA: 0.2 E.U./dL
pH, UA: 5.5 (ref 5.0–8.0)

## 2018-05-12 LAB — POCT GLYCOSYLATED HEMOGLOBIN (HGB A1C): Hemoglobin A1C: 10.4 % — AB (ref 4.0–5.6)

## 2018-05-12 MED ORDER — INSULIN PEN NEEDLE 32G X 6 MM MISC: 30.0000 [IU] | Freq: Every day | 2 refills | Status: DC

## 2018-05-12 MED ORDER — INSULIN GLARGINE 100 UNIT/ML SOLOSTAR PEN: 30.0000 [IU] | PEN_INJECTOR | Freq: Every day | SUBCUTANEOUS | 99 refills | Status: DC

## 2018-05-12 MED ORDER — GLIPIZIDE 10 MG PO TABS: 10.0000 mg | ORAL_TABLET | Freq: Two times a day (BID) | ORAL | 3 refills | Status: DC

## 2018-05-12 MED ORDER — LISINOPRIL 5 MG PO TABS: 5.0000 mg | ORAL_TABLET | Freq: Every day | ORAL | 3 refills | Status: DC

## 2018-05-12 MED FILL — LISINOPRIL 5 MG TABLET: 5 | 30 days supply | Qty: 30 | Fill #0

## 2018-05-12 MED FILL — !LANTUS SOLOSTAR 100UNITS/M: 100 | 30 days supply | Qty: 9 | Fill #0

## 2018-05-12 MED FILL — glipiZIDE 10 MG TABS: 10 | 30 days supply | Qty: 60 | Fill #0

## 2018-05-12 NOTE — Patient Instructions (Signed)
Diabetes and Foot Care Diabetes may cause you to have problems because of poor blood supply (circulation) to your feet and legs. This may cause the skin on your feet to become thinner, break easier, and heal more slowly. Your skin may become dry, and the skin may peel and crack. You may also have nerve damage in your legs and feet causing decreased feeling in them. You may not notice minor injuries to your feet that could lead to infections or more serious problems. Taking care of your feet is one of the most important things you can do for yourself. Follow these instructions at home:  Wear shoes at all times, even in the house. Do not go barefoot. Bare feet are easily injured.  Check your feet daily for blisters, cuts, and redness. If you cannot see the bottom of your feet, use a mirror or ask someone for help.  Wash your feet with warm water (do not use hot water) and mild soap. Then pat your feet and the areas between your toes until they are completely dry. Do not soak your feet as this can dry your skin.  Apply a moisturizing lotion or petroleum jelly (that does not contain alcohol and is unscented) to the skin on your feet and to dry, brittle toenails. Do not apply lotion between your toes.  Trim your toenails straight across. Do not dig under them or around the cuticle. File the edges of your nails with an emery board or nail file.  Do not cut corns or calluses or try to remove them with medicine.  Wear clean socks or stockings every day. Make sure they are not too tight. Do not wear knee-high stockings since they may decrease blood flow to your legs.  Wear shoes that fit properly and have enough cushioning. To break in new shoes, wear them for just a few hours a day. This prevents you from injuring your feet. Always look in your shoes before you put them on to be sure there are no objects inside.  Do not cross your legs. This may decrease the blood flow to your feet.  If you find a  minor scrape, cut, or break in the skin on your feet, keep it and the skin around it clean and dry. These areas may be cleansed with mild soap and water. Do not cleanse the area with peroxide, alcohol, or iodine.  When you remove an adhesive bandage, be sure not to damage the skin around it.  If you have a wound, look at it several times a day to make sure it is healing.  Do not use heating pads or hot water bottles. They may burn your skin. If you have lost feeling in your feet or legs, you may not know it is happening until it is too late.  Make sure your health care provider performs a complete foot exam at least annually or more often if you have foot problems. Report any cuts, sores, or bruises to your health care provider immediately. Contact a health care provider if:  You have an injury that is not healing.  You have cuts or breaks in the skin.  You have an ingrown nail.  You notice redness on your legs or feet.  You feel burning or tingling in your legs or feet.  You have pain or cramps in your legs and feet.  Your legs or feet are numb.  Your feet always feel cold. Get help right away if:  There is increasing   redness, swelling, or pain in or around a wound.  There is a red line that goes up your leg.  Pus is coming from a wound.  You develop a fever or as directed by your health care provider.  You notice a bad smell coming from an ulcer or wound. This information is not intended to replace advice given to you by your health care provider. Make sure you discuss any questions you have with your health care provider. Document Released: 06/28/2000 Document Revised: 12/07/2015 Document Reviewed: 12/08/2012 Elsevier Interactive Patient Education  2017 [employer].     Diabetes Mellitus and Exercise Exercising regularly is important for your overall health, especially when you have diabetes (diabetes mellitus). Exercising is not only about losing weight. It has many  health benefits, such as increasing muscle strength and bone density and reducing body fat and stress. This leads to improved fitness, flexibility, and endurance, all of which result in better overall health. Exercise has additional benefits for people with diabetes, including:  Reducing appetite.  Helping to lower and control blood glucose.  Lowering blood pressure.  Helping to control amounts of fatty substances (lipids) in the blood, such as cholesterol and triglycerides.  Helping the body to respond better to insulin (improving insulin sensitivity).  Reducing how much insulin the body needs.  Decreasing the risk for heart disease by: ? Lowering cholesterol and triglyceride levels. ? Increasing the levels of good cholesterol. ? Lowering blood glucose levels.  What is my activity plan? Your health care provider or certified diabetes educator can help you make a plan for the type and frequency of exercise (activity plan) that works for you. Make sure that you:  Do at least 150 minutes of moderate-intensity or vigorous-intensity exercise each week. This could be brisk walking, biking, or water aerobics. ? Do stretching and strength exercises, such as yoga or weightlifting, at least 2 times a week. ? Spread out your activity over at least 3 days of the week.  Get some form of physical activity every day. ? Do not go more than 2 days in a row without some kind of physical activity. ? Avoid being inactive for more than 90 minutes at a time. Take frequent breaks to walk or stretch.  Choose a type of exercise or activity that you enjoy, and set realistic goals.  Start slowly, and gradually increase the intensity of your exercise over time.  What do I need to know about managing my diabetes?  Check your blood glucose before and after exercising. ? If your blood glucose is higher than 240 mg/dL (13.3 mmol/L) before you exercise, check your urine for ketones. If you have ketones in your  urine, do not exercise until your blood glucose returns to normal.  Know the symptoms of low blood glucose (hypoglycemia) and how to treat it. Your risk for hypoglycemia increases during and after exercise. Common symptoms of hypoglycemia can include: ? Hunger. ? Anxiety. ? Sweating and feeling clammy. ? Confusion. ? Dizziness or feeling light-headed. ? Increased heart rate or palpitations. ? Blurry vision. ? Tingling or numbness around the mouth, lips, or tongue. ? Tremors or shakes. ? Irritability.  Keep a rapid-acting carbohydrate snack available before, during, and after exercise to help prevent or treat hypoglycemia.  Avoid injecting insulin into areas of the body that are going to be exercised. For example, avoid injecting insulin into: ? The arms, when playing tennis. ? The legs, when jogging.  Keep records of your exercise habits. Doing this  can help you and your health care provider adjust your diabetes management plan as needed. Write down: ? Food that you eat before and after you exercise. ? Blood glucose levels before and after you exercise. ? The type and amount of exercise you have done. ? When your insulin is expected to peak, if you use insulin. Avoid exercising at times when your insulin is peaking.  When you start a new exercise or activity, work with your health care provider to make sure the activity is safe for you, and to adjust your insulin, medicines, or food intake as needed.  Drink plenty of water while you exercise to prevent dehydration or heat stroke. Drink enough fluid to keep your urine clear or pale yellow. This information is not intended to replace advice given to you by your health care provider. Make sure you discuss any questions you have with your health care provider. Document Released: 09/21/2003 Document Revised: 01/19/2016 Document Reviewed: 12/11/2015 Elsevier Interactive Patient Education  2018 [place] Heart-healthy meal planning includes:  Limiting unhealthy fats.  Increasing healthy fats.  Making other small dietary changes.  You may need to talk with your doctor or a diet specialist (dietitian) to create an eating plan that is right for you. What types of fat should I choose?  Choose healthy fats. These include olive oil and canola oil, flaxseeds, walnuts, almonds, and seeds.  Eat more omega-3 fats. These include salmon, mackerel, sardines, tuna, flaxseed oil, and ground flaxseeds. Try to eat fish at least twice each week.  Limit saturated fats. ? Saturated fats are often found in animal products, such as meats, butter, and cream. ? Plant sources of saturated fats include palm oil, palm kernel oil, and coconut oil.  Avoid foods with partially hydrogenated oils in them. These include stick margarine, some tub margarines, cookies, crackers, and other baked goods. These contain trans fats. What general guidelines do I need to follow?  Check food labels carefully. Identify foods with trans fats or high amounts of saturated fat.  Fill one half of your plate with vegetables and green salads. Eat 4-5 servings of vegetables per day. A serving of vegetables is: ? 1 cup of raw leafy vegetables. ?  cup of raw or cooked cut-up vegetables. ?  cup of vegetable juice.  Fill one fourth of your plate with whole grains. Look for the word "whole" as the first word in the ingredient list.  Fill one fourth of your plate with lean protein foods.  Eat 4-5 servings of fruit per day. A serving of fruit is: ? One medium whole fruit. ?  cup of dried fruit. ?  cup of fresh, frozen, or canned fruit. ?  cup of 100% fruit juice.  Eat more foods that contain soluble fiber. These include apples, broccoli, carrots, beans, peas, and barley. Try to get 20-30 g of fiber per day.  Eat more home-cooked food. Eat less restaurant, buffet, and fast food.  Limit or avoid alcohol.  Limit foods high  in starch and sugar.  Avoid fried foods.  Avoid frying your food. Try baking, boiling, grilling, or broiling it instead. You can also reduce fat by: ? Removing the skin from poultry. ? Removing all visible fats from meats. ? Skimming the fat off of stews, soups, and gravies before serving them. ? Steaming vegetables in water or broth.  Lose weight if you are overweight.  Eat 4-5 servings of nuts, legumes, and seeds per week: ? One serving  of dried beans or legumes equals  cup after being cooked. ? One serving of nuts equals 1 ounces. ? One serving of seeds equals  ounce or one tablespoon.  You may need to keep track of how much salt or sodium you eat. This is especially true if you have high blood pressure. Talk with your doctor or dietitian to get more information. What foods can I eat? Grains Breads, including [place], white, pita, wheat, raisin, rye, oatmeal, and [place]. Tortillas that are neither fried nor made with lard or trans fat. Low-fat rolls, including hotdog and hamburger buns and English muffins. Biscuits. Muffins. Waffles. Pancakes. Light popcorn. Whole-grain cereals. Flatbread. Melba toast. Pretzels. Breadsticks. Rusks. Low-fat snacks. Low-fat crackers, including oyster, saltine, matzo, graham, animal, and rye. Rice and pasta, including brown rice and pastas that are made with whole wheat. Vegetables All vegetables. Fruits All fruits, but limit coconut. Meats and Other Protein Sources Lean, well-trimmed beef, veal, pork, and lamb. Chicken and [place] without skin. All fish and shellfish. Wild duck, rabbit, pheasant, and venison. Egg whites or low-cholesterol egg substitutes. Dried beans, peas, lentils, and tofu. Seeds and most nuts. Dairy Low-fat or nonfat cheeses, including ricotta, string, and mozzarella. Skim or 1% milk that is liquid, powdered, or evaporated. Buttermilk that is made with low-fat milk. Nonfat or low-fat yogurt. Beverages Mineral water. Diet  carbonated beverages. Sweets and Desserts Sherbets and fruit ices. Honey, jam, marmalade, jelly, and syrups. Meringues and gelatins. Pure sugar candy, such as hard candy, jelly beans, gumdrops, mints, marshmallows, and small amounts of dark chocolate. [employer] Eat all sweets and desserts in moderation. Fats and Oils Nonhydrogenated (trans-free) margarines. Vegetable oils, including soybean, sesame, sunflower, olive, peanut, safflower, corn, canola, and cottonseed. Salad dressings or mayonnaise made with a vegetable oil. Limit added fats and oils that you use for cooking, baking, salads, and as spreads. Other Cocoa powder. Coffee and tea. All seasonings and condiments. The items listed above may not be a complete list of recommended foods or beverages. Contact your dietitian for more options. What foods are not recommended? Grains Breads that are made with saturated or trans fats, oils, or whole milk. Croissants. Butter rolls. Cheese breads. Sweet rolls. Donuts. Buttered popcorn. Chow mein noodles. High-fat crackers, such as cheese or butter crackers. Meats and Other Protein Sources Fatty meats, such as hotdogs, short ribs, sausage, spareribs, bacon, rib eye roast or steak, and mutton. High-fat deli meats, such as salami and bologna. Caviar. Domestic duck and goose. Organ meats, such as kidney, liver, sweetbreads, and heart. Dairy Cream, sour cream, cream cheese, and creamed cottage cheese. Whole-milk cheeses, including blue (bleu), Monte[place] Jack, [place], [place], American, [place], Swiss, cheddar, [place], and [place]. Whole or 2% milk that is liquid, evaporated, or condensed. Whole buttermilk. Cream sauce or high-fat cheese sauce. Yogurt that is made from whole milk. Beverages Regular sodas and juice drinks with added sugar. Sweets and Desserts Frosting. Pudding. Cookies. Cakes other than angel food cake. Candy that has milk chocolate or white chocolate, hydrogenated fat, butter, coconut,  or unknown ingredients. Buttered syrups. Full-fat ice cream or ice cream drinks. Fats and Oils Gravy that has suet, meat fat, or shortening. Cocoa butter, hydrogenated oils, palm oil, coconut oil, palm kernel oil. These can often be found in baked products, candy, fried foods, nondairy creamers, and whipped toppings. Solid fats and shortenings, including bacon fat, salt pork, lard, and butter. Nondairy cream substitutes, such as coffee creamers and sour cream substitutes. Salad dressings that are made of  unknown oils, cheese, or sour cream. The items listed above may not be a complete list of foods and beverages to avoid. Contact your dietitian for more information. This information is not intended to replace advice given to you by your health care provider. Make sure you discuss any questions you have with your health care provider. Document Released: 12/31/2011 Document Revised: 12/07/2015 Document Reviewed: 12/23/2013 Elsevier Interactive Patient Education  2018 [place] Eating Plan DASH stands for "Dietary Approaches to Stop Hypertension." The DASH eating plan is a healthy eating plan that has been shown to reduce high blood pressure (hypertension). It may also reduce your risk for type 2 diabetes, heart disease, and stroke. The DASH eating plan may also help with weight loss. What are tips for following this plan? General guidelines  Avoid eating more than 2,300 mg (milligrams) of salt (sodium) a day. If you have hypertension, you may need to reduce your sodium intake to 1,500 mg a day.  Limit alcohol intake to no more than 1 drink a day for nonpregnant women and 2 drinks a day for men. One drink equals 12 oz of beer, 5 oz of wine, or 1 oz of hard liquor.  Work with your health care provider to maintain a healthy body weight or to lose weight. Ask what an ideal weight is for you.  Get at least 30 minutes of exercise that causes your heart to beat faster (aerobic exercise) most days  of the week. Activities may include walking, swimming, or biking.  Work with your health care provider or diet and nutrition specialist (dietitian) to adjust your eating plan to your individual calorie needs. Reading food labels  Check food labels for the amount of sodium per serving. Choose foods with less than 5 percent of the Daily Value of sodium. Generally, foods with less than 300 mg of sodium per serving fit into this eating plan.  To find whole grains, look for the word "whole" as the first word in the ingredient list. Shopping  Buy products labeled as "low-sodium" or "no salt added."  Buy fresh foods. Avoid canned foods and premade or frozen meals. Cooking  Avoid adding salt when cooking. Use salt-free seasonings or herbs instead of table salt or sea salt. Check with your health care provider or pharmacist before using salt substitutes.  Do not fry foods. Cook foods using healthy methods such as baking, boiling, grilling, and broiling instead.  Cook with heart-healthy oils, such as olive, canola, soybean, or sunflower oil. Meal planning   Eat a balanced diet that includes: ? 5 or more servings of fruits and vegetables each day. At each meal, try to fill half of your plate with fruits and vegetables. ? Up to 6-8 servings of whole grains each day. ? Less than 6 oz of lean meat, poultry, or fish each day. A 3-oz serving of meat is about the same size as a deck of cards. One egg equals 1 oz. ? 2 servings of low-fat dairy each day. ? A serving of nuts, seeds, or beans 5 times each week. ? Heart-healthy fats. Healthy fats called Omega-3 fatty acids are found in foods such as flaxseeds and coldwater fish, like sardines, salmon, and mackerel.  Limit how much you eat of the following: ? Canned or prepackaged foods. ? Food that is high in trans fat, such as fried foods. ? Food that is high in saturated fat, such as fatty meat. ? Sweets, desserts, sugary drinks, and other foods with  added sugar. ? Full-fat dairy products.  Do not salt foods before eating.  Try to eat at least 2 vegetarian meals each week.  Eat more home-cooked food and less restaurant, buffet, and fast food.  When eating at a restaurant, ask that your food be prepared with less salt or no salt, if possible. What foods are recommended? The items listed may not be a complete list. Talk with your dietitian about what dietary choices are best for you. Grains Whole-grain or whole-wheat bread. Whole-grain or whole-wheat pasta. Brown rice. [place]. Bulgur. Whole-grain and low-sodium cereals. Pita bread. Low-fat, low-sodium crackers. Whole-wheat flour tortillas. Vegetables Fresh or frozen vegetables (raw, steamed, roasted, or grilled). Low-sodium or reduced-sodium tomato and vegetable juice. Low-sodium or reduced-sodium tomato sauce and tomato paste. Low-sodium or reduced-sodium canned vegetables. Fruits All fresh, dried, or frozen fruit. Canned fruit in natural juice (without added sugar). Meat and other protein foods Skinless chicken or [place]. Ground chicken or [place]. Pork with fat trimmed off. Fish and seafood. Egg whites. Dried beans, peas, or lentils. Unsalted nuts, nut butters, and seeds. Unsalted canned beans. Lean cuts of beef with fat trimmed off. Low-sodium, lean deli meat. Dairy Low-fat (1%) or fat-free (skim) milk. Fat-free, low-fat, or reduced-fat cheeses. Nonfat, low-sodium ricotta or cottage cheese. Low-fat or nonfat yogurt. Low-fat, low-sodium cheese. Fats and oils Soft margarine without trans fats. Vegetable oil. Low-fat, reduced-fat, or light mayonnaise and salad dressings (reduced-sodium). Canola, safflower, olive, soybean, and sunflower oils. Avocado. Seasoning and other foods Herbs. Spices. Seasoning mixes without salt. Unsalted popcorn and pretzels. Fat-free sweets. What foods are not recommended? The items listed may not be a complete list. Talk with your dietitian about what  dietary choices are best for you. Grains Baked goods made with fat, such as croissants, muffins, or some breads. Dry pasta or rice meal packs. Vegetables Creamed or fried vegetables. Vegetables in a cheese sauce. Regular canned vegetables (not low-sodium or reduced-sodium). Regular canned tomato sauce and paste (not low-sodium or reduced-sodium). Regular tomato and vegetable juice (not low-sodium or reduced-sodium). Angie Fava. Olives. Fruits Canned fruit in a light or heavy syrup. Fried fruit. Fruit in cream or butter sauce. Meat and other protein foods Fatty cuts of meat. Ribs. Fried meat. [place] Salines. Sausage. Bologna and other processed lunch meats. Salami. Fatback. Hotdogs. Bratwurst. Salted nuts and seeds. Canned beans with added salt. Canned or smoked fish. Whole eggs or egg yolks. Chicken or [place] with skin. Dairy Whole or 2% milk, cream, and half-and-half. Whole or full-fat cream cheese. Whole-fat or sweetened yogurt. Full-fat cheese. Nondairy creamers. Whipped toppings. Processed cheese and cheese spreads. Fats and oils Butter. Stick margarine. Lard. Shortening. Ghee. Bacon fat. Tropical oils, such as coconut, palm kernel, or palm oil. Seasoning and other foods Salted popcorn and pretzels. Onion salt, garlic salt, seasoned salt, table salt, and sea salt. Worcestershire sauce. Tartar sauce. Barbecue sauce. Teriyaki sauce. Soy sauce, including reduced-sodium. Steak sauce. Canned and packaged gravies. Fish sauce. Oyster sauce. Cocktail sauce. Horseradish that you find on the shelf. Ketchup. Mustard. Meat flavorings and tenderizers. Bouillon cubes. Hot sauce and Tabasco sauce. Premade or packaged marinades. Premade or packaged taco seasonings. Relishes. Regular salad dressings. Where to find more information:  National Heart, Lung, and [place]: https://wilson-eaton.com/  American Heart Association: www.heart.org Summary  The DASH eating plan is a healthy eating plan that has been shown to reduce  high blood pressure (hypertension). It may also reduce your risk for type 2 diabetes, heart disease, and stroke.  With the DASH  eating plan, you should limit salt (sodium) intake to 2,300 mg a day. If you have hypertension, you may need to reduce your sodium intake to 1,500 mg a day.  When on the DASH eating plan, aim to eat more fresh fruits and vegetables, whole grains, lean proteins, low-fat dairy, and heart-healthy fats.  Work with your health care provider or diet and nutrition specialist (dietitian) to adjust your eating plan to your individual calorie needs. This information is not intended to replace advice given to you by your health care provider. Make sure you discuss any questions you have with your health care provider. Document Released: 06/20/2011 Document Revised: 06/24/2016 Document Reviewed: 06/24/2016 Elsevier Interactive Patient Education  [employer].

## 2018-05-12 NOTE — Progress Notes (Signed)
Follow Up  Subjective:    Patient ID: Tina Brown, female    DOB: 09/14/1985, 32 y.o.   MRN: [id number]   Chief Complaint  Patient presents with  . Follow-up    HPI  Tina Brown is a 32 year old female with a past medical history of Diabetes. She is here today for follow up.   Current Status: Since her last office visit, she is doing well. She states that she is only injecting 20 units daily of Lantus. She continues to take all other medications as prescribed. She admits frequent urination, hunger, fatigue, blurred vision, excessive hunger, and excessive thirst. she denies weight gain, weight loss, and poor wound healing.  She continues to have increasing anxiety with her increased Hgb A1c. She is very tearful today. She denies suicidal ideations, homicidal ideations, or auditory hallucinations.   She denies fevers, chills, fatigue, recent infections, weight loss, and night sweats. She has not had any hand falls. No chest pain, heart palpitations, cough and shortness of breath reported. No reports of GI problems such as nausea, vomiting, diarrhea, and constipation. She has no reports of blood in stools, dysuria and hematuria. She denies pain today.   Past Medical History:  Diagnosis Date  . Diabetes mellitus without complication ([place])     Family History  Problem Relation Age of Onset  . Diabetes Father     Social History   Socioeconomic History  . Marital status: Married    Spouse name: Not on file  . Number of children: Not on file  . Years of education: Not on file  . Highest education level: Not on file  Occupational History  . Not on file  Social Needs  . Financial resource strain: Not on file  . Food insecurity:    Worry: Not on file    Inability: Not on file  . Transportation needs:    Medical: Not on file    Non-medical: Not on file  Tobacco Use  . Smoking status: Never Smoker  . Smokeless tobacco: Never Used  Substance and Sexual Activity  . Alcohol use: No   . Drug use: No  . Sexual activity: Not on file  Lifestyle  . Physical activity:    Days per week: Not on file    Minutes per session: Not on file  . Stress: Not on file  Relationships  . Social connections:    Talks on phone: Not on file    Gets together: Not on file    Attends religious service: Not on file    Active member of club or organization: Not on file    Attends meetings of clubs or organizations: Not on file    Relationship status: Not on file  . Intimate partner violence:    Fear of current or ex partner: Not on file    Emotionally abused: Not on file    Physically abused: Not on file    Forced sexual activity: Not on file  Other Topics Concern  . Not on file  Social History Narrative  . Not on file    History reviewed. No pertinent surgical history.  Immunization History  Administered Date(s) Administered  . Influenza,inj,Quad PF,6+ Mos 04/19/2016, 04/13/2017, 05/12/2018  . Tdap 04/19/2016    Current Meds  Medication Sig  . BD INSULIN SYRINGE U/F 31G X 5/16" 0.5 ML MISC USE WITH INSULIN TWICE DAILY  . Biotin w/ Vitamins C & E (HAIR/SKIN/NAILS PO) Take 1 tablet by mouth daily as needed.  Marland Kitchen  glipiZIDE (GLUCOTROL) 10 MG tablet Take 1 tablet (10 mg total) by mouth 2 (two) times daily before a meal.  . Insulin Glargine (LANTUS SOLOSTAR) 100 UNIT/ML Solostar Pen Inject 30 Units into the skin daily.  . Insulin Pen Needle (B-D UF III MINI PEN NEEDLES) 31G X 5 MM MISC 30 Units by Does not apply route daily.  . Insulin Pen Needle (BD PEN NEEDLE NANO U/F) 32G X 4 MM MISC 30 Units by Does not apply route daily.  Marland Kitchen lisinopril (PRINIVIL,ZESTRIL) 5 MG tablet Take 1 tablet (5 mg total) by mouth daily.  . [DISCONTINUED] glipiZIDE (GLUCOTROL) 10 MG tablet Take 1 tablet (10 mg total) by mouth 2 (two) times daily before a meal.  . [DISCONTINUED] Insulin Glargine (LANTUS SOLOSTAR) 100 UNIT/ML Solostar Pen Inject 30 Units into the skin daily.  . [DISCONTINUED] lisinopril  (PRINIVIL,ZESTRIL) 5 MG tablet Take 1 tablet (5 mg total) by mouth daily.    No Known Allergies  BP 114/66 (BP Location: Left Arm, Patient Position: Sitting, Cuff Size: Small)   Pulse 88   Temp 98 F (36.7 C) (Oral)   Ht 5\' 7"  (1.702 m)   Wt 157 lb (71.2 kg)   LMP 05/04/2018   SpO2 99%   BMI 24.59 kg/m   Review of Systems  HENT: Negative.   Eyes: Positive for visual disturbance (blurry vision).  Respiratory: Negative.   Cardiovascular: Negative.   Gastrointestinal: Negative.   Endocrine: Positive for polydipsia, polyphagia and polyuria.  Musculoskeletal: Negative.   Skin: Negative.   Neurological: Positive for dizziness (Occasional ) and headaches (Occasional).  Psychiatric/Behavioral:       Increased anxiety. Tearful today.    Objective:   Physical Exam  Constitutional: She is oriented to person, place, and time. She appears well-developed and well-nourished.  HENT:  Head: Normocephalic and atraumatic.  Eyes: Conjunctivae are normal.  Cardiovascular: Normal rate, regular rhythm, normal heart sounds and intact distal pulses.  Abdominal: Soft. Bowel sounds are normal.  Musculoskeletal: Normal range of motion.  Neurological: She is alert and oriented to person, place, and time.  Skin: Skin is warm and dry.  Psychiatric: Judgment and thought content normal.  Increased anxiety, r/t health care status.   Nursing note and vitals reviewed.  Assessment & Plan:   1. Diabetes mellitus without complication ([place]) Mildly improved. Hgb A1c is stable at 10.4 today, decreased from 10.6 on 04/07/2018. She will begin to inject Lantus 30 units as prescribed. She will continue to decrease foods/beverages high in sugars and carbs and follow Heart Healthy or DASH diet. Increase physical activity to at least 30 minutes cardio exercise daily.  - POCT glycosylated hemoglobin (Hb A1C) - POCT urinalysis dipstick - HM Diabetes Foot Exam - Insulin Glargine (LANTUS SOLOSTAR) 100 UNIT/ML Solostar  Pen; Inject 30 Units into the skin daily.  Dispense: 5 pen; Refill: PRN - glipiZIDE (GLUCOTROL) 10 MG tablet; Take 1 tablet (10 mg total) by mouth 2 (two) times daily before a meal.  Dispense: 60 tablet; Refill: 3 - lisinopril (PRINIVIL,ZESTRIL) 5 MG tablet; Take 1 tablet (5 mg total) by mouth daily.  Dispense: 90 tablet; Refill: 3  2. Need for immunization against influenza - Flu Vaccine QUAD 36+ mos IM  3. Blurry vision, bilateral We will send her for referral to Optometry once her Hgb A1c has decreased. Monitor.   4. Intractable headache, unspecified chronicity pattern, unspecified headache type Stable today.   5. Follow up She will follow up in 2 months.   Current Meds  Medication Sig  . BD INSULIN SYRINGE U/F 31G X 5/16" 0.5 ML MISC USE WITH INSULIN TWICE DAILY  . Biotin w/ Vitamins C & E (HAIR/SKIN/NAILS PO) Take 1 tablet by mouth daily as needed.  Marland Kitchen glipiZIDE (GLUCOTROL) 10 MG tablet Take 1 tablet (10 mg total) by mouth 2 (two) times daily before a meal.  . Insulin Glargine (LANTUS SOLOSTAR) 100 UNIT/ML Solostar Pen Inject 30 Units into the skin daily.  . Insulin Pen Needle (B-D UF III MINI PEN NEEDLES) 31G X 5 MM MISC 30 Units by Does not apply route daily.  . Insulin Pen Needle (BD PEN NEEDLE NANO U/F) 32G X 4 MM MISC 30 Units by Does not apply route daily.  Marland Kitchen lisinopril (PRINIVIL,ZESTRIL) 5 MG tablet Take 1 tablet (5 mg total) by mouth daily.  . [DISCONTINUED] glipiZIDE (GLUCOTROL) 10 MG tablet Take 1 tablet (10 mg total) by mouth 2 (two) times daily before a meal.  . [DISCONTINUED] Insulin Glargine (LANTUS SOLOSTAR) 100 UNIT/ML Solostar Pen Inject 30 Units into the skin daily.  . [DISCONTINUED] lisinopril (PRINIVIL,ZESTRIL) 5 MG tablet Take 1 tablet (5 mg total) by mouth daily.    Kathe Becton,  MSN, FNP-C [facility] [address] [place], [place] [address] [phone]

## 2018-05-20 ENCOUNTER — Ambulatory Visit: Payer: Self-pay | Attending: Family Medicine

## 2018-06-26 MED FILL — !LANTUS SOLOSTAR 100UNITS/M: 100 | 30 days supply | Qty: 9 | Fill #1

## 2018-07-14 ENCOUNTER — Ambulatory Visit: Payer: Self-pay | Admitting: Family Medicine

## 2018-08-10 MED FILL — $LANTUS SOLOSTAR 100 UNITS/: 100 | 70 days supply | Qty: 21 | Fill #1

## 2018-11-11 MED FILL — $LANTUS SOLOSTAR 100 UNITS/: 100 | 70 days supply | Qty: 21 | Fill #2

## 2018-11-19 NOTE — Telephone Encounter (Signed)
Message sent to provider 

## 2019-02-22 MED FILL — $LANTUS SOLOSTAR 100 UNITS/: 100 | 30 days supply | Qty: 9 | Fill #3

## 2019-04-30 ENCOUNTER — Other Ambulatory Visit: Payer: Self-pay | Admitting: Family Medicine

## 2019-04-30 ENCOUNTER — Other Ambulatory Visit: Payer: Self-pay

## 2019-04-30 DIAGNOSIS — E119 Type 2 diabetes mellitus without complications: Secondary | ICD-10-CM

## 2019-04-30 MED ORDER — LANTUS SOLOSTAR 100 UNIT/ML ~~LOC~~ SOPN: 30.0000 [IU] | PEN_INJECTOR | Freq: Every day | SUBCUTANEOUS | 0 refills | Status: DC

## 2019-04-30 MED ORDER — BD PEN NEEDLE MINI U/F 31G X 5 MM MISC: 30.0000 [IU] | Freq: Every day | 0 refills | Status: DC

## 2019-04-30 MED FILL — TRUEPLUS 5-BEVEL PEN NEEDLE: 31G X 5 MM | 30 days supply | Qty: 100 | Fill #0

## 2019-04-30 MED FILL — $LANTUS SOLOSTAR 100 UNITS/: 100 | 45 days supply | Qty: 15 | Fill #0

## 2019-05-25 ENCOUNTER — Ambulatory Visit: Payer: Self-pay | Admitting: Family Medicine

## 2019-05-26 ENCOUNTER — Encounter: Payer: Self-pay | Admitting: Family Medicine

## 2019-05-26 ENCOUNTER — Other Ambulatory Visit: Payer: Self-pay

## 2019-05-26 ENCOUNTER — Ambulatory Visit: Payer: Self-pay | Attending: Family Medicine | Admitting: Family Medicine

## 2019-05-26 VITALS — BP 114/76 | HR 96 | Temp 98.5°F | Ht 67.0 in | Wt 150.0 lb

## 2019-05-26 DIAGNOSIS — Z794 Long term (current) use of insulin: Secondary | ICD-10-CM

## 2019-05-26 DIAGNOSIS — E1165 Type 2 diabetes mellitus with hyperglycemia: Secondary | ICD-10-CM

## 2019-05-26 LAB — POCT GLYCOSYLATED HEMOGLOBIN (HGB A1C): HbA1c, POC (controlled diabetic range): 11.3 % — AB (ref 0.0–7.0)

## 2019-05-26 LAB — GLUCOSE, POCT (MANUAL RESULT ENTRY): POC Glucose: 336 mg/dl — AB (ref 70–99)

## 2019-05-26 MED ORDER — LANTUS SOLOSTAR 100 UNIT/ML ~~LOC~~ SOPN: 20.0000 [IU] | PEN_INJECTOR | Freq: Two times a day (BID) | SUBCUTANEOUS | 1 refills | Status: DC

## 2019-05-26 MED ORDER — GLIPIZIDE 10 MG PO TABS: 10.0000 mg | ORAL_TABLET | Freq: Two times a day (BID) | ORAL | 1 refills | Status: DC

## 2019-05-26 MED FILL — glipiZIDE 10 MG TABS: 10 | 30 days supply | Qty: 60 | Fill #0

## 2019-05-26 NOTE — Progress Notes (Signed)
Subjective:  Patient ID: Tina Brown, female    DOB: 1986/06/10  Age: 33 y.o. MRN: [id number]  CC: Diabetes   HPI Tina Brown is a 33 year old female patient of Kathe Becton at the patient care center who presents today for follow-up of her diabetes mellitus. She was last seen by her PCP one year ago at the [facility] but informs me that she lost her medical insurance and was advised she would be unable to be seen there. She has been on Lantus 20 units twice daily but has run out of glipizide.  Denies hypoglycemia, numbness in extremities or visual concerns and she has no additional concerns today.  Past Medical History:  Diagnosis Date  . Diabetes mellitus without complication ([place])     History reviewed. No pertinent surgical history.  Family History  Problem Relation Age of Onset  . Diabetes Father     No Known Allergies  Outpatient Medications Prior to Visit  Medication Sig Dispense Refill  . BD INSULIN SYRINGE U/F 31G X 5/16" 0.5 ML MISC USE WITH INSULIN TWICE DAILY 100 each 3  . Biotin w/ Vitamins C & E (HAIR/SKIN/NAILS PO) Take 1 tablet by mouth daily as needed.    . Insulin Pen Needle (B-D UF III MINI PEN NEEDLES) 31G X 5 MM MISC 30 Units by Does not apply route daily. 100 each 0  . Insulin Glargine (LANTUS SOLOSTAR) 100 UNIT/ML Solostar Pen Inject 30 Units into the skin daily. 5 pen 0  . Insulin Pen Needle (BD PEN NEEDLE MICRO U/F) 32G X 6 MM MISC 30 Units by Does not apply route daily. (Patient not taking: Reported on 05/26/2019) 100 each 2  . Insulin Pen Needle (BD PEN NEEDLE NANO U/F) 32G X 4 MM MISC 30 Units by Does not apply route daily. (Patient not taking: Reported on 05/26/2019) 100 each 2  . Insulin Pen Needle (BD ULTRA-FINE PEN NEEDLES) 29G X 12.7MM MISC ICD10 E11.9 for use with insulin administration (Patient not taking: Reported on 05/12/2018) 200 each 2  . Insulin Syringe 27G X 1/2" 0.5 ML MISC 1 each by Does not apply route 2 (two) times daily  before a meal. (Patient not taking: Reported on 05/12/2018) 100 each 3  . lisinopril (PRINIVIL,ZESTRIL) 5 MG tablet Take 1 tablet (5 mg total) by mouth daily. (Patient not taking: Reported on 05/26/2019) 90 tablet 3  . glipiZIDE (GLUCOTROL) 10 MG tablet Take 1 tablet (10 mg total) by mouth 2 (two) times daily before a meal. (Patient not taking: Reported on 05/26/2019) 60 tablet 3   No facility-administered medications prior to visit.      ROS Review of Systems  Constitutional: Negative for activity change, appetite change and fatigue.  HENT: Negative for congestion, sinus pressure and sore throat.   Eyes: Negative for visual disturbance.  Respiratory: Negative for cough, chest tightness, shortness of breath and wheezing.   Cardiovascular: Negative for chest pain and palpitations.  Gastrointestinal: Negative for abdominal distention, abdominal pain and constipation.  Endocrine: Negative for polydipsia.  Genitourinary: Negative for dysuria and frequency.  Musculoskeletal: Negative for arthralgias and back pain.  Skin: Negative for rash.  Neurological: Negative for tremors, light-headedness and numbness.  Hematological: Does not bruise/bleed easily.  Psychiatric/Behavioral: Negative for agitation and behavioral problems.    Objective:  BP 114/76   Pulse 96   Temp 98.5 F (36.9 C) (Oral)   Ht 5\' 7"  (1.702 m)   Wt 150 lb (68 kg)   SpO2 99%  BMI 23.49 kg/m   BP/Weight 05/26/2019 05/12/2018 [id number]  Systolic BP [id number]  Diastolic BP 76 66 66  Wt. (Lbs) 150 157 160.2  BMI 23.49 24.59 25.09      Physical Exam Constitutional:      Appearance: She is well-developed.  Neck:     Vascular: No JVD.  Cardiovascular:     Rate and Rhythm: Normal rate.     Heart sounds: Normal heart sounds. No murmur.  Pulmonary:     Effort: Pulmonary effort is normal.     Breath sounds: Normal breath sounds. No wheezing or rales.  Chest:     Chest wall: No tenderness.  Abdominal:      General: Bowel sounds are normal. There is no distension.     Palpations: Abdomen is soft. There is no mass.     Tenderness: There is no abdominal tenderness.  Musculoskeletal: Normal range of motion.     Brown lower leg: No edema.     Left lower leg: No edema.  Neurological:     Mental Status: She is alert and oriented to person, place, and time.  Psychiatric:        Mood and Affect: Mood normal.     CMP Latest Ref Rng & Units 09/03/2017 05/31/2016 03/06/2016  Glucose 65 - 99 mg/dL 224(H) 212(H) 181(H)  BUN 6 - 20 mg/dL 11 11 <5(L)  Creatinine 0.57 - 1.00 mg/dL 0.67 0.65 0.43(L)  Sodium 134 - 144 mmol/L 138 136 138  Potassium 3.5 - 5.2 mmol/L 4.2 4.3 3.6  Chloride 96 - 106 mmol/L 101 102 108  CO2 20 - 29 mmol/L 22 28 26   Calcium 8.7 - 10.2 mg/dL 9.9 9.9 8.7(L)  Total Protein 6.0 - 8.5 g/dL 7.6 7.0 -  Total Bilirubin 0.0 - 1.2 mg/dL 0.2 0.3 -  Alkaline Phos 39 - 117 IU/L 91 51 -  AST 0 - 40 IU/L 14 11 -  ALT 0 - 32 IU/L 12 7 -    Lipid Panel  No results found for: CHOL, TRIG, HDL, CHOLHDL, VLDL, LDLCALC, LDLDIRECT  CBC    Component Value Date/Time   WBC 6.2 09/03/2017 1224   WBC 4.1 05/31/2016 1111   RBC 5.00 09/03/2017 1224   RBC 4.57 05/31/2016 1111   HGB 13.4 09/03/2017 1224   HCT 41.4 09/03/2017 1224   PLT 328 09/03/2017 1224   MCV 83 09/03/2017 1224   MCH 26.8 09/03/2017 1224   MCH 29.1 05/31/2016 1111   MCHC 32.4 09/03/2017 1224   MCHC 32.9 05/31/2016 1111   RDW 14.7 09/03/2017 1224   LYMPHSABS 2.5 09/03/2017 1224   MONOABS 287 05/31/2016 1111   EOSABS 0.2 09/03/2017 1224   BASOSABS 0.0 09/03/2017 1224    Lab Results  Component Value Date   HGBA1C 11.3 (A) 05/26/2019    Assessment & Plan:   1. Type 2 diabetes mellitus with hyperglycemia, with long-term current use of insulin ([place]) Uncontrolled with A1c of 11.3 at goal is less than 7 Uncontrolled due to running out of glipizide which I have refilled along with her Lantus Advised to keep a log of the  blood sugars which will be reviewed with the patient care center with her PCP; I have confirmed she can be seen at the [facility] without medical insurance Counseled on Diabetic diet, my plate method, [id number] minutes of moderate intensity exercise/week Blood sugar logs with fasting goals of 80-120 mg/dl, random of less than 180 and in the event of  sugars less than 60 mg/dl or greater than 400 mg/dl encouraged to notify the clinic. Advised on the need for annual eye exams, annual foot exams, Pneumonia vaccine. - Glucose (CBG) - HgB A1c - Insulin Glargine (LANTUS SOLOSTAR) 100 UNIT/ML Solostar Pen; Inject 20 Units into the skin 2 (two) times daily.  Dispense: 5 pen; Refill: 1 - glipiZIDE (GLUCOTROL) 10 MG tablet; Take 1 tablet (10 mg total) by mouth 2 (two) times daily before a meal.  Dispense: 60 tablet; Refill: 1 - Basic Metabolic Panel   Meds ordered this encounter  Medications  . Insulin Glargine (LANTUS SOLOSTAR) 100 UNIT/ML Solostar Pen    Sig: Inject 20 Units into the skin 2 (two) times daily.    Dispense:  5 pen    Refill:  1    Subsequent refills to come from [facility]  . glipiZIDE (GLUCOTROL) 10 MG tablet    Sig: Take 1 tablet (10 mg total) by mouth 2 (two) times daily before a meal.    Dispense:  60 tablet    Refill:  1    Subsequent refill to come from [facility]    Follow-up: Return in about 1 month (around 06/25/2019) for PCP with [facility].       Charlott Rakes, MD, FAAFP. [facility] and [place], [place]   05/26/2019, 3:53 PM

## 2019-05-26 NOTE — Patient Instructions (Signed)

## 2019-05-27 LAB — BASIC METABOLIC PANEL
BUN/Creatinine Ratio: 23 (ref 9–23)
BUN: 19 mg/dL (ref 6–20)
CO2: 19 mmol/L — ABNORMAL LOW (ref 20–29)
Calcium: 9.6 mg/dL (ref 8.7–10.2)
Chloride: 99 mmol/L (ref 96–106)
Creatinine, Ser: 0.81 mg/dL (ref 0.57–1.00)
GFR calc Af Amer: 109 mL/min/{1.73_m2} (ref >59)
GFR calc non Af Amer: 94 mL/min/{1.73_m2} (ref >59)
Glucose: 302 mg/dL — ABNORMAL HIGH (ref 65–99)
Potassium: 4.2 mmol/L (ref 3.5–5.2)
Sodium: 135 mmol/L (ref 134–144)

## 2019-05-28 ENCOUNTER — Telehealth: Payer: Self-pay

## 2019-05-28 NOTE — Telephone Encounter (Signed)
-----   Message from Charlott Rakes, MD sent at 05/27/2019  8:12 AM EST ----- Labs reveal elevated glucose. Please advise to comply with medications and follow up with Patient care center

## 2019-05-28 NOTE — Telephone Encounter (Signed)
Patient was called and a voicemail was left informing patient to return phone call for lab results. 

## 2019-06-03 ENCOUNTER — Telehealth: Payer: Self-pay | Admitting: Family Medicine

## 2019-06-03 NOTE — Telephone Encounter (Signed)
I contacted our language resource dept  We called and person said no one lived there by that name

## 2019-06-30 MED FILL — !LANTUS SOLOSTAR 100UNITS/M: 100 | 29 days supply | Qty: 12 | Fill #0

## 2019-08-03 ENCOUNTER — Other Ambulatory Visit: Payer: Self-pay | Admitting: Family Medicine

## 2019-08-03 MED FILL — $LANTUS SOLOSTAR 100 UNITS/: 100 | 29 days supply | Qty: 12 | Fill #1

## 2019-08-03 MED FILL — glipiZIDE 10 MG TABS: 10 | 30 days supply | Qty: 60 | Fill #1

## 2019-08-16 DIAGNOSIS — L84 Corns and callosities: Secondary | ICD-10-CM

## 2019-08-16 HISTORY — DX: Corns and callosities: L84

## 2019-08-25 ENCOUNTER — Ambulatory Visit (INDEPENDENT_AMBULATORY_CARE_PROVIDER_SITE_OTHER): Payer: Self-pay | Admitting: Family Medicine

## 2019-08-25 ENCOUNTER — Other Ambulatory Visit: Payer: Self-pay

## 2019-08-25 ENCOUNTER — Encounter: Payer: Self-pay | Admitting: Family Medicine

## 2019-08-25 VITALS — BP 126/67 | HR 93 | Temp 98.3°F | Ht 67.0 in | Wt 161.4 lb

## 2019-08-25 DIAGNOSIS — R7309 Other abnormal glucose: Secondary | ICD-10-CM

## 2019-08-25 DIAGNOSIS — Z09 Encounter for follow-up examination after completed treatment for conditions other than malignant neoplasm: Secondary | ICD-10-CM

## 2019-08-25 DIAGNOSIS — R739 Hyperglycemia, unspecified: Secondary | ICD-10-CM

## 2019-08-25 DIAGNOSIS — E1165 Type 2 diabetes mellitus with hyperglycemia: Secondary | ICD-10-CM

## 2019-08-25 DIAGNOSIS — Z794 Long term (current) use of insulin: Secondary | ICD-10-CM

## 2019-08-25 DIAGNOSIS — L84 Corns and callosities: Secondary | ICD-10-CM

## 2019-08-25 LAB — POCT URINALYSIS DIPSTICK
Bilirubin, UA: NEGATIVE
Blood, UA: NEGATIVE
Glucose, UA: NEGATIVE
Ketones, UA: NEGATIVE
Leukocytes, UA: NEGATIVE
Nitrite, UA: NEGATIVE
Protein, UA: NEGATIVE
Spec Grav, UA: 1.025 (ref 1.010–1.025)
Urobilinogen, UA: 0.2 E.U./dL
pH, UA: 6 (ref 5.0–8.0)

## 2019-08-25 LAB — POCT GLYCOSYLATED HEMOGLOBIN (HGB A1C): Hemoglobin A1C: 8.9 % — AB (ref 4.0–5.6)

## 2019-08-25 LAB — GLUCOSE, POCT (MANUAL RESULT ENTRY): POC Glucose: 186 mg/dl — AB (ref 70–99)

## 2019-08-25 MED ORDER — GLIPIZIDE 10 MG PO TABS: 10.0000 mg | ORAL_TABLET | Freq: Two times a day (BID) | ORAL | 6 refills | Status: DC

## 2019-08-25 MED ORDER — LANTUS SOLOSTAR 100 UNIT/ML ~~LOC~~ SOPN: 20.0000 [IU] | PEN_INJECTOR | Freq: Two times a day (BID) | SUBCUTANEOUS | 10 refills | Status: DC

## 2019-08-25 MED FILL — $LANTUS SOLOSTAR 100 UNITS/: 100 | 22 days supply | Qty: 9 | Fill #0

## 2019-08-25 MED FILL — glipiZIDE 10 MG TABS: 10 | 30 days supply | Qty: 60 | Fill #0

## 2019-08-25 NOTE — Progress Notes (Signed)
Patient [facility]   Established Patient Office Visit  Subjective:  Patient ID: Tina Brown, female    DOB: 05-30-1986  Age: 34 y.o. MRN: [id number]  CC:  Chief Complaint  Patient presents with  . Follow-up    annual    HPI Tina Brown is a 34 year old female who presents for Follow Up today.   Past Medical History:  Diagnosis Date  . Diabetes mellitus without complication (HCC)    Current Status: This will be Ms. Quizon's initial office visit with me. She was previously seeing Molli Barrows, NP at our office in  for hr PCP needs. Since her last office visit, she is doing well with no complaints. Since her last office visit, she has c/o bilateral feet calloses which are very hard and hurt her feet. We will use Arabic Interpreter today. She has seen low range of 78 and high of 317 since his last office visit. She denies fatigue, frequent urination, blurred vision, excessive hunger, excessive thirst, weight gain, weight loss, and poor wound healing. She continues to check her feet regularly. She denies fevers, chills, recent infections, weight loss, and night sweats. She has not had any headaches, dizziness, and falls. No chest pain, heart palpitations, cough and shortness of breath reported. No reports of GI problems such as nausea, vomiting, diarrhea, and constipation. She has no reports of blood in stools, dysuria and hematuria. No depression or anxiety reported today. She denies suicidal ideations, homicidal ideations, or auditory hallucinations. She denies pain today.   History reviewed. No pertinent surgical history.  Family History  Problem Relation Age of Onset  . Diabetes Father     Social History   Socioeconomic History  . Marital status: Married    Spouse name: Not on file  . Number of children: Not on file  . Years of education: Not on file  . Highest education level: Not on file  Occupational History  . Not on file    Tobacco Use  . Smoking status: Never Smoker  . Smokeless tobacco: Never Used  Substance and Sexual Activity  . Alcohol use: No  . Drug use: No  . Sexual activity: Not Currently  Other Topics Concern  . Not on file  Social History Narrative  . Not on file   Social Determinants of Health   Financial Resource Strain:   . Difficulty of Paying Living Expenses: Not on file  Food Insecurity:   . Worried About Charity fundraiser in the Last Year: Not on file  . Ran Out of Food in the Last Year: Not on file  Transportation Needs:   . Lack of Transportation (Medical): Not on file  . Lack of Transportation (Non-Medical): Not on file  Physical Activity:   . Days of Exercise per Week: Not on file  . Minutes of Exercise per Session: Not on file  Stress:   . Feeling of Stress : Not on file  Social Connections:   . Frequency of Communication with Friends and Family: Not on file  . Frequency of Social Gatherings with Friends and Family: Not on file  . Attends Religious Services: Not on file  . Active Member of Clubs or Organizations: Not on file  . Attends Archivist Meetings: Not on file  . Marital Status: Not on file  Intimate Partner Violence:   . Fear of Current or Ex-Partner: Not on file  . Emotionally Abused: Not on file  .  Physically Abused: Not on file  . Sexually Abused: Not on file    Outpatient Medications Prior to Visit  Medication Sig Dispense Refill  . Biotin w/ Vitamins C & E (HAIR/SKIN/NAILS PO) Take 1 tablet by mouth daily as needed.    Marland Kitchen glipiZIDE (GLUCOTROL) 10 MG tablet Take 1 tablet (10 mg total) by mouth 2 (two) times daily before a meal. 60 tablet 1  . Insulin Glargine (LANTUS SOLOSTAR) 100 UNIT/ML Solostar Pen Inject 20 Units into the skin 2 (two) times daily. 5 pen 1  . lisinopril (PRINIVIL,ZESTRIL) 5 MG tablet Take 1 tablet (5 mg total) by mouth daily. (Patient not taking: Reported on 05/26/2019) 90 tablet 3  . BD INSULIN SYRINGE U/F 31G X 5/16"  0.5 ML MISC USE WITH INSULIN TWICE DAILY 100 each 3  . Insulin Pen Needle (B-D UF III MINI PEN NEEDLES) 31G X 5 MM MISC 30 Units by Does not apply route daily. 100 each 0  . Insulin Pen Needle (BD PEN NEEDLE MICRO U/F) 32G X 6 MM MISC 30 Units by Does not apply route daily. (Patient not taking: Reported on 05/26/2019) 100 each 2  . Insulin Pen Needle (BD PEN NEEDLE NANO U/F) 32G X 4 MM MISC 30 Units by Does not apply route daily. (Patient not taking: Reported on 05/26/2019) 100 each 2  . Insulin Pen Needle (BD ULTRA-FINE PEN NEEDLES) 29G X 12.7MM MISC ICD10 E11.9 for use with insulin administration (Patient not taking: Reported on 05/12/2018) 200 each 2  . Insulin Syringe 27G X 1/2" 0.5 ML MISC 1 each by Does not apply route 2 (two) times daily before a meal. (Patient not taking: Reported on 05/12/2018) 100 each 3   No facility-administered medications prior to visit.    No Known Allergies  ROS Review of Systems  Constitutional: Negative.   HENT: Negative.   Eyes: Negative.   Respiratory: Negative.   Cardiovascular: Negative.   Gastrointestinal: Negative.   Endocrine: Negative.   Genitourinary: Negative.   Musculoskeletal: Negative.   Skin: Negative.        Bilateral foot calluse   Allergic/Immunologic: Negative.   Neurological: Negative.   Hematological: Negative.   Psychiatric/Behavioral: Negative.     Objective:    Physical Exam  Constitutional: She is oriented to person, place, and time. She appears well-developed and well-nourished.  HENT:  Head: Normocephalic and atraumatic.  Eyes: Conjunctivae are normal.  Cardiovascular: Normal rate, regular rhythm, normal heart sounds and intact distal pulses.  Pulmonary/Chest: Effort normal and breath sounds normal.  Abdominal: Soft. Bowel sounds are normal.  Musculoskeletal:        General: Normal range of motion.     Cervical back: Normal range of motion and neck supple.  Neurological: She is alert and oriented to person,  place, and time. She has normal reflexes.  Skin: Skin is warm and dry.  Bilateral callus on plantar part of feet   Psychiatric: She has a normal mood and affect. Her behavior is normal. Judgment and thought content normal.  Nursing note and vitals reviewed.   BP 126/67   Pulse 93   Temp 98.3 F (36.8 C) (Oral)   Ht 5\' 7"  (1.702 m)   Wt 161 lb 6.4 oz (73.2 kg)   LMP 08/09/2019   SpO2 100%   BMI 25.28 kg/m  Wt Readings from Last 3 Encounters:  08/25/19 161 lb 6.4 oz (73.2 kg)  05/26/19 150 lb (68 kg)  05/12/18 157 lb (71.2 kg)  Health Maintenance Due  Topic Date Due  . PNEUMOCOCCAL POLYSACCHARIDE VACCINE AGE 79-64 HIGH RISK  07/15/1985  . OPHTHALMOLOGY EXAM  07/15/1993  . PAP SMEAR-Modifier  07/15/2004  . FOOT EXAM  05/13/2019    There are no preventive care reminders to display for this patient.  Lab Results  Component Value Date   TSH 0.68 05/31/2016   Lab Results  Component Value Date   WBC 6.2 09/03/2017   HGB 13.4 09/03/2017   HCT 41.4 09/03/2017   MCV 83 09/03/2017   PLT 328 09/03/2017   Lab Results  Component Value Date   NA 135 05/26/2019   K 4.2 05/26/2019   CO2 19 (L) 05/26/2019   GLUCOSE 302 (H) 05/26/2019   BUN 19 05/26/2019   CREATININE 0.81 05/26/2019   BILITOT 0.2 09/03/2017   ALKPHOS 91 09/03/2017   AST 14 09/03/2017   ALT 12 09/03/2017   PROT 7.6 09/03/2017   ALBUMIN 4.4 09/03/2017   CALCIUM 9.6 05/26/2019   ANIONGAP 4 (L) 03/06/2016   No results found for: CHOL No results found for: HDL No results found for: LDLCALC No results found for: TRIG  No results found for: CHOLHDL Lab Results  Component Value Date   HGBA1C 8.9 (A) 08/25/2019    Assessment & Plan:   1. Type 2 diabetes mellitus with hyperglycemia, with long-term current use of insulin (HCC) She will continue medication as prescribed, to decrease foods/beverages high in sugars and carbs and follow Heart Healthy or DASH diet. Increase physical activity to at least 30  minutes cardio exercise daily.  - POCT urinalysis dipstick - POCT glycosylated hemoglobin (Hb A1C) - POCT glucose (manual entry) - glipiZIDE (GLUCOTROL) 10 MG tablet; Take 1 tablet (10 mg total) by mouth 2 (two) times daily before a meal.  Dispense: 60 tablet; Refill: 6 - Insulin Glargine (LANTUS SOLOSTAR) 100 UNIT/ML Solostar Pen; Inject 20 Units into the skin 2 (two) times daily.  Dispense: 5 pen; Refill: 10 - Ambulatory referral to Podiatry  2. Hemoglobin A1c between 7% and 9.0% Improved. Hgb A1c decreased at 8.9 today. Monitor.  - glipiZIDE (GLUCOTROL) 10 MG tablet; Take 1 tablet (10 mg total) by mouth 2 (two) times daily before a meal.  Dispense: 60 tablet; Refill: 6 - Insulin Glargine (LANTUS SOLOSTAR) 100 UNIT/ML Solostar Pen; Inject 20 Units into the skin 2 (two) times daily.  Dispense: 5 pen; Refill: 10 - Ambulatory referral to Podiatry  3. Hyperglycemia - glipiZIDE (GLUCOTROL) 10 MG tablet; Take 1 tablet (10 mg total) by mouth 2 (two) times daily before a meal.  Dispense: 60 tablet; Refill: 6 - Insulin Glargine (LANTUS SOLOSTAR) 100 UNIT/ML Solostar Pen; Inject 20 Units into the skin 2 (two) times daily.  Dispense: 5 pen; Refill: 10 - Ambulatory referral to Podiatry  4. Foot callus Diabetic. We will refer her to Podiatry today. - Ambulatory referral to Podiatry  5. Follow up She will follow up in 6 months.   Meds ordered this encounter  Medications  . glipiZIDE (GLUCOTROL) 10 MG tablet    Sig: Take 1 tablet (10 mg total) by mouth 2 (two) times daily before a meal.    Dispense:  60 tablet    Refill:  6    Subsequent refill to come from [facility]  . Insulin Glargine (LANTUS SOLOSTAR) 100 UNIT/ML Solostar Pen    Sig: Inject 20 Units into the skin 2 (two) times daily.    Dispense:  5 pen    Refill:  10  Subsequent refills to come from [facility]     Referral Orders     Ambulatory referral to [facility],  MSN, FNP-BC [place], [place] [address] [phone]- fax  Problem List Items Addressed This Visit      Other   Hyperglycemia   Relevant Medications   glipiZIDE (GLUCOTROL) 10 MG tablet   Insulin Glargine (LANTUS SOLOSTAR) 100 UNIT/ML Solostar Pen   Other Relevant Orders   Ambulatory referral to Podiatry    Other Visit Diagnoses    Type 2 diabetes mellitus with hyperglycemia, with long-term current use of insulin (HCC)    -  Primary   Relevant Medications   glipiZIDE (GLUCOTROL) 10 MG tablet   Insulin Glargine (LANTUS SOLOSTAR) 100 UNIT/ML Solostar Pen   Other Relevant Orders   POCT urinalysis dipstick (Completed)   POCT glycosylated hemoglobin (Hb A1C) (Completed)   POCT glucose (manual entry) (Completed)   Ambulatory referral to Podiatry   Hemoglobin A1c between 7.0% and 9.0%       Relevant Medications   glipiZIDE (GLUCOTROL) 10 MG tablet   Insulin Glargine (LANTUS SOLOSTAR) 100 UNIT/ML Solostar Pen   Other Relevant Orders   Ambulatory referral to Podiatry   Foot callus       Relevant Orders   Ambulatory referral to Podiatry   Follow up          Meds ordered this encounter  Medications  . glipiZIDE (GLUCOTROL) 10 MG tablet    Sig: Take 1 tablet (10 mg total) by mouth 2 (two) times daily before a meal.    Dispense:  60 tablet    Refill:  6    Subsequent refill to come from [facility]  . Insulin Glargine (LANTUS SOLOSTAR) 100 UNIT/ML Solostar Pen    Sig: Inject 20 Units into the skin 2 (two) times daily.    Dispense:  5 pen    Refill:  10    Subsequent refills to come from [facility]    Follow-up: Return in about 6 months (around 02/22/2020).    Azzie Glatter, FNP

## 2019-08-26 ENCOUNTER — Telehealth: Payer: Self-pay

## 2019-08-26 ENCOUNTER — Encounter: Payer: Self-pay | Admitting: Family Medicine

## 2019-08-26 DIAGNOSIS — R739 Hyperglycemia, unspecified: Secondary | ICD-10-CM | POA: Insufficient documentation

## 2019-08-26 DIAGNOSIS — L84 Corns and callosities: Secondary | ICD-10-CM | POA: Insufficient documentation

## 2019-08-26 DIAGNOSIS — R7309 Other abnormal glucose: Secondary | ICD-10-CM | POA: Insufficient documentation

## 2019-08-26 NOTE — Telephone Encounter (Signed)
Message left for Alonnah Aloi to return phone call. [facility] is trying to schedule an appointment.

## 2019-08-30 ENCOUNTER — Other Ambulatory Visit: Payer: Self-pay | Admitting: Family Medicine

## 2019-08-30 ENCOUNTER — Telehealth: Payer: Self-pay | Admitting: Family Medicine

## 2019-08-30 DIAGNOSIS — E119 Type 2 diabetes mellitus without complications: Secondary | ICD-10-CM

## 2019-08-30 MED FILL — LISINOPRIL 5 MG TABLET: 5 | 30 days supply | Qty: 30 | Fill #0

## 2019-08-30 NOTE — Telephone Encounter (Signed)
Please call pt back regarding finding a new place to refer her to because the latest referral would not accept orange card.

## 2019-08-30 NOTE — Telephone Encounter (Signed)
The number she preferred to be called back on was [phone]. However, this number is not listed on her chart.

## 2019-08-31 NOTE — Telephone Encounter (Signed)
Patient called back: I gave her [facility] phone number to call and make her appointment.

## 2019-09-03 ENCOUNTER — Ambulatory Visit: Payer: Self-pay | Admitting: Podiatry

## 2019-10-01 MED FILL — glipiZIDE 10 MG TABS: 10 | 30 days supply | Qty: 60 | Fill #1

## 2019-10-01 MED FILL — LISINOPRIL 5 MG TABLET: 5 | 30 days supply | Qty: 30 | Fill #1

## 2019-10-01 MED FILL — !LANTUS SOLOSTAR 100UNITS/M: 100 | 22 days supply | Qty: 9 | Fill #1

## 2019-10-26 MED FILL — LISINOPRIL 5 MG TABLET: 5 | 30 days supply | Qty: 30 | Fill #2

## 2019-10-26 MED FILL — !LANTUS SOLOSTAR 100UNITS/M: 100 | 22 days supply | Qty: 9 | Fill #2

## 2019-10-26 MED FILL — glipiZIDE 10 MG TABS: 10 | 30 days supply | Qty: 60 | Fill #2

## 2019-11-23 ENCOUNTER — Other Ambulatory Visit: Payer: Self-pay | Admitting: Family Medicine

## 2019-11-23 ENCOUNTER — Telehealth: Payer: Self-pay

## 2019-11-23 DIAGNOSIS — E119 Type 2 diabetes mellitus without complications: Secondary | ICD-10-CM

## 2019-11-23 MED ORDER — LISINOPRIL 5 MG PO TABS: 5.0000 mg | ORAL_TABLET | Freq: Every day | ORAL | 11 refills | Status: DC

## 2019-11-23 NOTE — Telephone Encounter (Signed)
Pt only has #30 remaining on her Lisinopril and is requesting at least 2 months to be filled today.  If appropriate, can we have a new script for her Lisinopril for 2-3 months?

## 2020-02-22 ENCOUNTER — Ambulatory Visit: Payer: Self-pay | Admitting: Family Medicine

## 2020-06-22 ENCOUNTER — Encounter: Payer: Self-pay | Admitting: Nurse Practitioner

## 2020-06-22 ENCOUNTER — Ambulatory Visit (INDEPENDENT_AMBULATORY_CARE_PROVIDER_SITE_OTHER): Payer: Self-pay | Admitting: Nurse Practitioner

## 2020-06-22 ENCOUNTER — Other Ambulatory Visit: Payer: Self-pay

## 2020-06-22 ENCOUNTER — Other Ambulatory Visit: Payer: Self-pay | Admitting: Nurse Practitioner

## 2020-06-22 VITALS — BP 123/83 | HR 90 | Temp 97.6°F | Ht 67.0 in | Wt 158.0 lb

## 2020-06-22 DIAGNOSIS — E119 Type 2 diabetes mellitus without complications: Secondary | ICD-10-CM

## 2020-06-22 DIAGNOSIS — Z3201 Encounter for pregnancy test, result positive: Secondary | ICD-10-CM

## 2020-06-22 DIAGNOSIS — R7309 Other abnormal glucose: Secondary | ICD-10-CM

## 2020-06-22 DIAGNOSIS — E1165 Type 2 diabetes mellitus with hyperglycemia: Secondary | ICD-10-CM

## 2020-06-22 DIAGNOSIS — R739 Hyperglycemia, unspecified: Secondary | ICD-10-CM

## 2020-06-22 DIAGNOSIS — Z794 Long term (current) use of insulin: Secondary | ICD-10-CM

## 2020-06-22 LAB — POCT URINALYSIS DIPSTICK
Bilirubin, UA: NEGATIVE
Blood, UA: NEGATIVE
Glucose, UA: POSITIVE — AB
Leukocytes, UA: NEGATIVE
Nitrite, UA: NEGATIVE
Protein, UA: NEGATIVE
Spec Grav, UA: >=1.03 — AB (ref 1.010–1.025)
Urobilinogen, UA: 0.2 E.U./dL
pH, UA: 6 (ref 5.0–8.0)

## 2020-06-22 LAB — POCT URINE PREGNANCY: Preg Test, Ur: POSITIVE — AB

## 2020-06-22 LAB — GLUCOSE, POCT (MANUAL RESULT ENTRY): POC Glucose: 210 mg/dl — AB (ref 70–99)

## 2020-06-22 LAB — POCT GLYCOSYLATED HEMOGLOBIN (HGB A1C): Hemoglobin A1C: 5.7 % — AB (ref 4.0–5.6)

## 2020-06-22 MED ORDER — LANTUS SOLOSTAR 100 UNIT/ML ~~LOC~~ SOPN: 20.0000 [IU] | PEN_INJECTOR | Freq: Every day | SUBCUTANEOUS | 11 refills | Status: DC

## 2020-06-22 MED ORDER — GLIPIZIDE 10 MG PO TABS: 10.0000 mg | ORAL_TABLET | Freq: Two times a day (BID) | ORAL | 11 refills | Status: DC

## 2020-06-22 MED FILL — $LANTUS SOLOSTAR 100 UNITS/: 100 | 30 days supply | Qty: 6 | Fill #0

## 2020-06-22 MED FILL — glipiZIDE 10 MG TABS: 10 | 30 days supply | Qty: 60 | Fill #0

## 2020-06-22 NOTE — Progress Notes (Signed)
Established Patient Office Visit  Subjective:  Patient ID: Tina Brown, female    DOB: 1986/02/22  Age: 34 y.o. MRN: [id number]  CC:  Chief Complaint  Patient presents with   Diabetes    HPI Tina Brown presents for follow up. She is a patient of Kathe Becton NP. She  has a past medical history of Diabetes mellitus without complication ([place]), Foot callus (08/2019), and In vitro fertilization.     Diabetes Mellitus Patient presents for follow up of diabetes. Current symptoms include: hyperglycemia. Symptoms have progressed to a point and plateaued. Patient denies foot ulcerations, hyperglycemia, hypoglycemia , increased appetite, nausea, paresthesia of the feet, polydipsia, polyuria, visual disturbances, vomiting and weight loss. Evaluation to date has included: fasting blood sugar, fasting lipid panel and hemoglobin A1C.  Home sugars: BGs are high in the morning. She gets 180-200.  Current treatment: Continued insulin which has been effective and Continued metformin which has been effective.   She had the IVF in [place] and then traveled to [place]. She had been getting prenatal care in [place]. She is requesting referral for additional care.   Past Medical History:  Diagnosis Date   Diabetes mellitus without complication ([place])    Foot callus 08/2019   In vitro fertilization     Past Surgical History:  Procedure Laterality Date   IVF      Family History  Problem Relation Age of Onset   Diabetes Mother    Hypertension Mother    Diabetes Father    Hypertension Father    Diabetes Maternal Grandmother    Hypertension Maternal Grandmother    Diabetes Maternal Grandfather    Hypertension Maternal Grandfather    Diabetes Paternal Grandmother    Hypertension Paternal Grandmother    Diabetes Paternal Grandfather    Hypertension Paternal Grandfather     Social History   Socioeconomic History   Marital status: Married    Spouse name: Not on file   Number of  children: Not on file   Years of education: Not on file   Highest education level: Not on file  Occupational History   Not on file  Tobacco Use   Smoking status: Never Smoker   Smokeless tobacco: Never Used  Vaping Use   Vaping Use: Never used  Substance and Sexual Activity   Alcohol use: No   Drug use: No   Sexual activity: Yes    Partners: Male  Other Topics Concern   Not on file  Social History Narrative   Not on file   Social Determinants of Health   Financial Resource Strain: Not on file  Food Insecurity: Not on file  Transportation Needs: Not on file  Physical Activity: Not on file  Stress: Not on file  Social Connections: Not on file  Intimate Partner Violence: Unknown   Fear of Current or Ex-Partner: No   Emotionally Abused: No   Physically Abused: No   Sexually Abused: Not on file    Outpatient Medications Prior to Visit  Medication Sig Dispense Refill   Biotin w/ Vitamins C & E (HAIR/SKIN/NAILS PO) Take 1 tablet by mouth daily as needed.     glipiZIDE (GLUCOTROL) 10 MG tablet Take 1 tablet (10 mg total) by mouth 2 (two) times daily before a meal. 60 tablet 6   Insulin Glargine (LANTUS SOLOSTAR) 100 UNIT/ML Solostar Pen Inject 20 Units into the skin 2 (two) times daily. 5 pen 10   lisinopril (ZESTRIL) 5 MG tablet Take 1 tablet (5 mg  total) by mouth daily. 30 tablet 11   No facility-administered medications prior to visit.    No Known Allergies  ROS Review of Systems  Constitutional: Negative.   HENT: Negative.   Eyes: Negative.  Negative for visual disturbance.  Respiratory: Negative.  Negative for shortness of breath.   Cardiovascular: Negative for chest pain.  Gastrointestinal: Negative for constipation, diarrhea, nausea and vomiting.  Endocrine: Negative.   Genitourinary: Negative.   Musculoskeletal: Negative.   Skin: Negative.   Allergic/Immunologic: Negative.   Neurological: Negative.   Hematological: Negative.        Objective:    Physical Exam Exam conducted with a chaperone present.  Constitutional:      General: She is not in acute distress.    Appearance: She is not ill-appearing, toxic-appearing or diaphoretic.  HENT:     Head: Normocephalic and atraumatic.     Nose: Nose normal.     Mouth/Throat:     Mouth: Mucous membranes are moist.  Cardiovascular:     Rate and Rhythm: Normal rate and regular rhythm.     Pulses: Normal pulses.     Heart sounds: Normal heart sounds.  Pulmonary:     Effort: Pulmonary effort is normal.     Breath sounds: Normal breath sounds.  Abdominal:     General: Bowel sounds are normal.     Palpations: Abdomen is soft.  Musculoskeletal:        General: Normal range of motion.     Cervical back: Normal range of motion.  Skin:    General: Skin is warm and dry.     Capillary Refill: Capillary refill takes less than 2 seconds.  Neurological:     General: No focal deficit present.     Mental Status: She is alert and oriented to person, place, and time.  Psychiatric:        Mood and Affect: Mood normal.        Behavior: Behavior normal.        Thought Content: Thought content normal.        Judgment: Judgment normal.     BP 123/83    Pulse 90    Temp 97.6 F (36.4 C) (Oral)    Ht 5\' 7"  (1.702 m)    Wt 158 lb (71.7 kg)    SpO2 100%    BMI 24.75 kg/m  Wt Readings from Last 3 Encounters:  06/27/20 159 lb (72.1 kg)  06/22/20 158 lb (71.7 kg)  08/25/19 161 lb 6.4 oz (73.2 kg)     Health Maintenance Due  Topic Date Due   Hepatitis C Screening  Never done   PNEUMOCOCCAL POLYSACCHARIDE VACCINE AGE 25-64 HIGH RISK  Never done   COVID-19 Vaccine (1) Never done   OPHTHALMOLOGY EXAM  Never done   PAP SMEAR-Modifier  Never done   URINE MICROALBUMIN  09/03/2018   FOOT EXAM  05/13/2019    There are no preventive care reminders to display for this patient.  Lab Results  Component Value Date   TSH 0.68 05/31/2016   Lab Results  Component Value Date    WBC 6.2 09/03/2017   HGB 13.4 09/03/2017   HCT 41.4 09/03/2017   MCV 83 09/03/2017   PLT 328 09/03/2017   Lab Results  Component Value Date   NA 135 05/26/2019   K 4.2 05/26/2019   CO2 19 (L) 05/26/2019   GLUCOSE 302 (H) 05/26/2019   BUN 19 05/26/2019   CREATININE 0.81 05/26/2019   BILITOT  0.2 09/03/2017   ALKPHOS 91 09/03/2017   AST 14 09/03/2017   ALT 12 09/03/2017   PROT 7.6 09/03/2017   ALBUMIN 4.4 09/03/2017   CALCIUM 9.6 05/26/2019   ANIONGAP 4 (L) 03/06/2016   No results found for: CHOL No results found for: HDL No results found for: LDLCALC No results found for: TRIG No results found for: CHOLHDL Lab Results  Component Value Date   HGBA1C 5.7 (A) 06/22/2020      Assessment & Plan:   Problem List Items Addressed This Visit      Endocrine   RESOLVED: Diabetes mellitus without complication (HCC) - Primary (Chronic)   Relevant Medications   insulin glargine (LANTUS SOLOSTAR) 100 UNIT/ML Solostar Pen   Other Relevant Orders   POCT glucose (manual entry) (Completed)   POCT glycosylated hemoglobin (Hb A1C) (Completed)     Other   RESOLVED: Hemoglobin A1c between 7.0% and 9.0%   Relevant Medications   insulin glargine (LANTUS SOLOSTAR) 100 UNIT/ML Solostar Pen   RESOLVED: Hyperglycemia   Relevant Medications   insulin glargine (LANTUS SOLOSTAR) 100 UNIT/ML Solostar Pen    Other Visit Diagnoses    Type 2 diabetes mellitus with hyperglycemia, with long-term current use of insulin (HCC)  \ A1c at goal Continue with current regimen   Encourage regular CBG monitoring Encourage contacting office if excessive hyperglycemia and or hypoglycemia Lifestyle modification with healthy diet (fewer calories, more high fiber foods, whole grains and non-starchy vegetables, lower fat meat and fish, low-fat diary include healthy oils) regular exercise (physical activity) Home BP monitoring also encouraged goal <130/80    Relevant Medications   insulin glargine (LANTUS  SOLOSTAR) 100 UNIT/ML Solostar Pen   Positive pregnancy test     Continue prenatal vitamins, talked briefly about DM and pregnancy    Relevant Orders   POCT urine pregnancy (Completed)   POCT urinalysis dipstick (Completed)   Ambulatory referral to Obstetrics / Gynecology      Meds ordered this encounter  Medications   DISCONTD: glipiZIDE (GLUCOTROL) 10 MG tablet    Sig: Take 1 tablet (10 mg total) by mouth 2 (two) times daily before a meal.    Dispense:  60 tablet    Refill:  11    Subsequent refill to come from [facility]    Order Specific Question:   Supervising Provider    Answer:   Tresa Garter [[id number]]   insulin glargine (LANTUS SOLOSTAR) 100 UNIT/ML Solostar Pen    Sig: Inject 20 Units into the skin at bedtime.    Dispense:  6 mL    Refill:  11    Subsequent refills to come from [facility]    Order Specific Question:   Supervising Provider    Answer:   Tresa Garter [[id number]]    Follow-up: Return in about 6 months (around 12/21/2020) for with her PCP.    Vevelyn Francois, NP

## 2020-06-22 NOTE — Progress Notes (Signed)
Needs referral to GYN.

## 2020-06-22 NOTE — Patient Instructions (Signed)

## 2020-06-23 ENCOUNTER — Ambulatory Visit: Payer: Self-pay

## 2020-06-26 ENCOUNTER — Other Ambulatory Visit: Payer: Self-pay

## 2020-06-26 ENCOUNTER — Ambulatory Visit (INDEPENDENT_AMBULATORY_CARE_PROVIDER_SITE_OTHER): Payer: Medicaid Other

## 2020-06-26 ENCOUNTER — Other Ambulatory Visit: Payer: Self-pay | Admitting: Internal Medicine

## 2020-06-26 ENCOUNTER — Telehealth: Payer: Self-pay | Admitting: Clinical

## 2020-06-26 DIAGNOSIS — O0992 Supervision of high risk pregnancy, unspecified, second trimester: Secondary | ICD-10-CM | POA: Diagnosis not present

## 2020-06-26 DIAGNOSIS — O0990 Supervision of high risk pregnancy, unspecified, unspecified trimester: Secondary | ICD-10-CM | POA: Insufficient documentation

## 2020-06-26 MED ORDER — PRENATAL PLUS 27-1 MG PO TABS: 1.0000 | ORAL_TABLET | Freq: Every day | ORAL | 0 refills | Status: DC

## 2020-06-26 MED FILL — PRENATAL 27-1 MG TABS: 27-1 | 30 days supply | Qty: 30 | Fill #0

## 2020-06-26 NOTE — Progress Notes (Signed)
Patient here for new OBGYN intake nurse visit. Patient's medical, surgical & family history updated. Screening labs obtained, other than SMA labs since office does not have those tubes in stock. Unable to do genetic screening because no test kits at this time. Will get at future visit.   Offered Tdap & Flu vaccine but patient would like to discuss both immunizations with provider prior to receiving. Patient has been checking FSBS at home, states that they have been in the 115s fasting.  Did fetal heartbeat & it was at 153. Patient denies cramping, vaginal bleeding/discharge. KWalker,CMA.

## 2020-06-26 NOTE — Telephone Encounter (Signed)
[facility] Case Management Referral Note  06/26/2020 Name: Tina Brown MRN: [id number] DOB: Feb 06, 1986 Tina Brown is a 34 y.o. year old female who sees Vevelyn Francois, NP for primary care. LCSW was consulted to assess patient's needs and assist the patient with Financial Difficulties related to lack of health coverage.  Interpreter: Yes.     Interpreter Name & Language: Arabic, [phone]  Assessment: Patient experiencing Financial constraints related to lack of health coverage. Patient is uninsured and is pregnant. At PCP appointment last week, patient was referred to Dr. Juleen China at [facility] for [facility] care.   Intervention: CSW called patient today with interpreter. Advised that patient apply for pregnancy Medicaid at social services office. Provided contact information for social services. Advised that patient may also apply for financial assistance through [facility] if not approved for Medicaid.   Review of patient status, including review of consultants reports, relevant laboratory and other test results, and collaboration with appropriate care team members and the patient's provider was performed as part of comprehensive patient evaluation and provision of services.    SDOH (Social Determinants of Health) assessments performed: No   Outpatient Encounter Medications as of 06/26/2020  Medication Sig  . Biotin w/ Vitamins C & E (HAIR/SKIN/NAILS PO) Take 1 tablet by mouth daily as needed.  Marland Kitchen glipiZIDE (GLUCOTROL) 10 MG tablet Take 1 tablet (10 mg total) by mouth 2 (two) times daily before a meal.  . insulin glargine (LANTUS SOLOSTAR) 100 UNIT/ML Solostar Pen Inject 20 Units into the skin at bedtime.  Marland Kitchen lisinopril (ZESTRIL) 5 MG tablet Take 1 tablet (5 mg total) by mouth daily.  . prenatal vitamin w/FE, FA (PRENATAL 1 + 1) 27-1 MG TABS tablet Take 1 tablet by mouth daily at 12 noon.   No facility-administered encounter medications on file as of 06/26/2020.    Goals  Addressed   None      Follow up Plan: 1. CSW available from clinic as needed.  Estanislado Emms, [facility] [phone]

## 2020-06-27 ENCOUNTER — Other Ambulatory Visit: Payer: Self-pay | Admitting: Internal Medicine

## 2020-06-27 ENCOUNTER — Ambulatory Visit (INDEPENDENT_AMBULATORY_CARE_PROVIDER_SITE_OTHER): Payer: Medicaid Other | Admitting: Internal Medicine

## 2020-06-27 VITALS — BP 134/77 | HR 106 | Temp 97.2°F | Resp 17 | Ht 67.0 in | Wt 159.0 lb

## 2020-06-27 DIAGNOSIS — O24112 Pre-existing diabetes mellitus, type 2, in pregnancy, second trimester: Secondary | ICD-10-CM

## 2020-06-27 DIAGNOSIS — Z23 Encounter for immunization: Secondary | ICD-10-CM | POA: Diagnosis not present

## 2020-06-27 DIAGNOSIS — O09519 Supervision of elderly primigravida, unspecified trimester: Secondary | ICD-10-CM | POA: Insufficient documentation

## 2020-06-27 DIAGNOSIS — O24919 Unspecified diabetes mellitus in pregnancy, unspecified trimester: Secondary | ICD-10-CM | POA: Insufficient documentation

## 2020-06-27 DIAGNOSIS — O09512 Supervision of elderly primigravida, second trimester: Secondary | ICD-10-CM

## 2020-06-27 DIAGNOSIS — O0992 Supervision of high risk pregnancy, unspecified, second trimester: Secondary | ICD-10-CM

## 2020-06-27 MED ORDER — METFORMIN HCL 1000 MG PO TABS: 1000.0000 mg | ORAL_TABLET | Freq: Two times a day (BID) | ORAL | 3 refills | Status: DC

## 2020-06-27 MED ORDER — ASPIRIN 81 MG PO TBEC: 81.0000 mg | DELAYED_RELEASE_TABLET | Freq: Every day | ORAL | 12 refills | Status: DC

## 2020-06-27 MED FILL — METFORMIN HCL 1000 MG TABS: 1000 | 30 days supply | Qty: 60 | Fill #0

## 2020-06-27 NOTE — Patient Instructions (Signed)
Stop taking Glipizide and Lisinopril. Start taking Meformin and a baby aspirin.

## 2020-06-27 NOTE — Progress Notes (Signed)
Subjective:    Tina Brown is being seen today for her first obstetrical visit. Was receiving prenatal care in [place] but moved here on 12/3. This is not a planned pregnancy. She is at [redacted]w[redacted]d gestation by LMP. Her obstetrical history is significant for Previous DM. Relationship with FOB: spouse, living together. Patient does intend to breast feed. Pregnancy history fully reviewed. No vaginal bleeding or LOF. +FM. Taking PNV.   Menstrual History: OB History    Gravida  1   Para      Term      Preterm      AB      Living        SAB      IAB      Ectopic      Multiple      Live Births              Menarche age: unsure  Patient's last menstrual period was 01/31/2020 (exact date).    The following portions of the patient's history were reviewed and updated as appropriate: allergies, current medications, past family history, past medical history, past social history, past surgical history and problem list.  Review of Systems Pertinent items are noted in HPI.    Objective:    No exam performed today, patient refused exam due to presence of female interpreter.    Assessment:    Pregnancy at 21 and 1/7 weeks    Plan:    1. Supervision of high risk pregnancy in second trimester Return for initial prenatal labs.  Return for PAP with female interpreter.  Continue PNV.  Accepts genetic screening.  Will need anatomy scan.  - SMN1 Copy Number Analysis; Future - AFP TETRA; Future - Hemoglobinopathy Evaluation; Future - [place] MFM OB COMP + 14 WK; Future  2. Pre-existing type 2 diabetes mellitus during pregnancy in second trimester Last A1c 5.7% on 06/22/20. Patient not monitoring glucose. Discussed need for tighter glycemic control in pregnancy. Continue Lantus. D/c Glipizide and start Metformin. D/c Lisinopril. Return in one week with glucose log to adjust insulin as appropriate. Start ASA therapy.  - aspirin (EC-81 ASPIRIN) 81 MG EC tablet; Take 1 tablet (81 mg total) by  mouth daily. Swallow whole.  Dispense: 30 tablet; Refill: 12 - metFORMIN (GLUCOPHAGE) 1000 MG tablet; Take 1 tablet (1,000 mg total) by mouth 2 (two) times daily with a meal.  Dispense: 180 tablet; Refill: 3 - Ambulatory referral to Ophthalmology - Ambulatory referral to Pediatric Cardiology - [place] MFM OB COMP + 14 WK; Future  3. Primigravida of advanced maternal age in second trimester - [place] MFM OB COMP + 96 WK; Future  4. Needs flu shot - Flu Vaccine QUAD 6+ mos PF IM (Fluarix Quad PF)

## 2020-06-28 ENCOUNTER — Telehealth: Payer: Self-pay | Admitting: Licensed Clinical Social Worker

## 2020-06-28 LAB — GC/CHLAMYDIA PROBE AMP
Chlamydia trachomatis, NAA: NEGATIVE
Neisseria Gonorrhoeae by PCR: NEGATIVE

## 2020-06-28 LAB — CULTURE, OB URINE

## 2020-06-28 LAB — URINE CULTURE, OB REFLEX

## 2020-06-28 NOTE — Telephone Encounter (Signed)
Call placed to patient utilizing [employer] (Arabic-[place]-id#[id number]) regarding nurse's request to assist patient with information on applying for pregnancy medicaid. Pt's mobile number is not in service; therefore, call was placed to home. Pt's spouse answered, stating that he can relay message to pt. LCSW informed him that resources will be mailed to address on file. Address was verified and no additional concerns noted.

## 2020-06-29 ENCOUNTER — Telehealth: Payer: Self-pay | Admitting: Clinical

## 2020-06-29 NOTE — Telephone Encounter (Signed)
Integrated Behavioral Health General Follow Up Note  06/29/2020 Name: Tina Brown MRN: [id number] DOB: Mar 22, 1986 Tina Brown is a 34 y.o. year old female who sees Vevelyn Francois, NP for primary care. LCSW was initially consulted to assess patient's needs and assist the patient with financial difficulties related to lack of health coverage.  Interpreter: Yes.     Interpreter Name & Language: Arabic, [place] [phone]  Assessment: Patient experiencing financial difficulties related to lack of health coverage. Patient is uninsured and is pregnant. At PCP appointment last week, patient was referred to Dr. Juleen China at [facility] for [facility] care.   Ongoing Intervention: Today CSW called patient to follow up on interpreter needs for applying for Medicaid. Patient indicated she already went to social services and completed the Medicaid application this past Tuesday. CSW advised patient to call with additional questions as needed.  Review of patient status, including review of consultants reports, relevant laboratory and other test results, and collaboration with appropriate care team members and the patient's provider was performed as part of comprehensive patient evaluation and provision of services.     Follow up Plan: 1. CSW available from clinic as needed.  Estanislado Emms, [place] [facility] [phone]

## 2020-07-03 LAB — CYSTIC FIBROSIS MUTATION 97: Interpretation: NOT DETECTED

## 2020-07-03 LAB — OBSTETRIC PANEL, INCLUDING HIV
Antibody Screen: NEGATIVE
Basophils Absolute: 0 10*3/uL (ref 0.0–0.2)
Basos: 0 %
EOS (ABSOLUTE): 0.1 10*3/uL (ref 0.0–0.4)
Eos: 2 %
HIV Screen 4th Generation wRfx: NONREACTIVE
Hematocrit: 38.9 % (ref 34.0–46.6)
Hemoglobin: 13.1 g/dL (ref 11.1–15.9)
Hepatitis B Surface Ag: NEGATIVE
Immature Grans (Abs): 0 10*3/uL (ref 0.0–0.1)
Immature Granulocytes: 0 %
Lymphocytes Absolute: 1.7 10*3/uL (ref 0.7–3.1)
Lymphs: 24 %
MCH: 28.5 pg (ref 26.6–33.0)
MCHC: 33.7 g/dL (ref 31.5–35.7)
MCV: 85 fL (ref 79–97)
Monocytes Absolute: 0.4 10*3/uL (ref 0.1–0.9)
Monocytes: 6 %
Neutrophils Absolute: 4.6 10*3/uL (ref 1.4–7.0)
Neutrophils: 68 %
Platelets: 322 10*3/uL (ref 150–450)
RBC: 4.6 x10E6/uL (ref 3.77–5.28)
RDW: 14.4 % (ref 11.7–15.4)
RPR Ser Ql: NONREACTIVE
Rh Factor: POSITIVE
Rubella Antibodies, IGG: 5.5 index (ref >0.99)
WBC: 6.8 10*3/uL (ref 3.4–10.8)

## 2020-07-04 ENCOUNTER — Encounter: Payer: Self-pay | Admitting: Internal Medicine

## 2020-07-04 ENCOUNTER — Ambulatory Visit: Payer: Medicaid Other | Admitting: *Deleted

## 2020-07-04 ENCOUNTER — Other Ambulatory Visit: Payer: Self-pay | Admitting: *Deleted

## 2020-07-04 ENCOUNTER — Ambulatory Visit: Payer: Medicaid Other | Attending: Obstetrics and Gynecology

## 2020-07-04 ENCOUNTER — Other Ambulatory Visit: Payer: Self-pay | Admitting: Internal Medicine

## 2020-07-04 ENCOUNTER — Encounter: Payer: Self-pay | Admitting: *Deleted

## 2020-07-04 ENCOUNTER — Other Ambulatory Visit: Payer: Self-pay

## 2020-07-04 VITALS — BP 128/76 | HR 96

## 2020-07-04 DIAGNOSIS — O09522 Supervision of elderly multigravida, second trimester: Secondary | ICD-10-CM | POA: Insufficient documentation

## 2020-07-04 DIAGNOSIS — O0992 Supervision of high risk pregnancy, unspecified, second trimester: Secondary | ICD-10-CM | POA: Diagnosis not present

## 2020-07-04 DIAGNOSIS — O24112 Pre-existing diabetes mellitus, type 2, in pregnancy, second trimester: Secondary | ICD-10-CM

## 2020-07-04 DIAGNOSIS — O09512 Supervision of elderly primigravida, second trimester: Secondary | ICD-10-CM

## 2020-07-04 DIAGNOSIS — O24912 Unspecified diabetes mellitus in pregnancy, second trimester: Secondary | ICD-10-CM

## 2020-07-06 ENCOUNTER — Other Ambulatory Visit: Payer: Self-pay

## 2020-07-06 ENCOUNTER — Telehealth: Payer: Self-pay

## 2020-07-06 ENCOUNTER — Ambulatory Visit (INDEPENDENT_AMBULATORY_CARE_PROVIDER_SITE_OTHER): Payer: Medicaid Other | Admitting: Internal Medicine

## 2020-07-06 ENCOUNTER — Other Ambulatory Visit (HOSPITAL_COMMUNITY)
Admission: RE | Admit: 2020-07-06 | Discharge: 2020-07-06 | Disposition: A | Discharge status: Home / Self Care | Payer: Medicaid Other | Source: Ambulatory Visit | Attending: Internal Medicine | Admitting: Internal Medicine

## 2020-07-06 VITALS — BP 116/79 | HR 93 | Temp 97.5°F | Wt 156.0 lb

## 2020-07-06 DIAGNOSIS — O09812 Supervision of pregnancy resulting from assisted reproductive technology, second trimester: Secondary | ICD-10-CM

## 2020-07-06 DIAGNOSIS — O24112 Pre-existing diabetes mellitus, type 2, in pregnancy, second trimester: Secondary | ICD-10-CM

## 2020-07-06 DIAGNOSIS — D251 Intramural leiomyoma of uterus: Secondary | ICD-10-CM

## 2020-07-06 DIAGNOSIS — Z124 Encounter for screening for malignant neoplasm of cervix: Secondary | ICD-10-CM

## 2020-07-06 DIAGNOSIS — Z113 Encounter for screening for infections with a predominantly sexual mode of transmission: Secondary | ICD-10-CM | POA: Diagnosis present

## 2020-07-06 DIAGNOSIS — O0992 Supervision of high risk pregnancy, unspecified, second trimester: Secondary | ICD-10-CM

## 2020-07-06 DIAGNOSIS — O09813 Supervision of pregnancy resulting from assisted reproductive technology, third trimester: Secondary | ICD-10-CM | POA: Insufficient documentation

## 2020-07-06 NOTE — Progress Notes (Signed)
PRENATAL VISIT NOTE  Subjective:  Tina Brown is a 34 y.o. G1P0 at [redacted]w[redacted]d being seen today for ongoing prenatal care.  She is currently monitored for the following issues for this high-risk pregnancy and has Diabetes mellitus with complication ([place]); High-risk pregnancy supervision; Diabetes in pregnancy; Advanced maternal age, 1st pregnancy; Pregnancy resulting from in vitro fertilization, second trimester; and Intramural leiomyoma of uterus on their problem list.  Patient reports no complaints.  Contractions: Not present. Vag. Bleeding: None.  Movement: Present. Denies leaking of fluid.   Patient reports she has started Metformin. She is compliant with lantus 20u daily. She has fasting CBGs 70-110 and postprandial CBGs 100-160. She only eats two meals per day. Unable to verbalize appropriate meal choices with DM. Does not eat sugary foods or drink sodas.   The following portions of the patient's history were reviewed and updated as appropriate: allergies, current medications, past family history, past medical history, past social history, past surgical history and problem list.   Objective:   Vitals:   07/06/20 1500  BP: 116/79  Pulse: 93  Temp: (!) 97.5 F (36.4 C)  Weight: 156 lb (70.8 kg)    Fetal Status: Fetal Heart Rate (bpm): 157   Movement: Present     General:  Alert, oriented and cooperative. Patient is in no acute distress.  Skin: Skin is warm and dry. No rash noted.   Cardiovascular: Normal heart rate noted  Respiratory: Normal respiratory effort, no problems with respiration noted  Abdomen: Soft, gravid, appropriate for gestational age.  Pain/Pressure: Absent     Pelvic: .GU/GYN: Exam performed in the presence of a chaperone. External genitalia within normal limits.  Vaginal mucosa pink, moist, normal rugae.  Nonfriable cervix without discharge or bleeding noted on speculum exam. Appears to have a small cervical polyp. Bimanual exam revealed gravid uterus.  No cervical  motion tenderness. No adnexal masses bilaterally.     Extremities: Normal range of motion.  Edema: None  Mental Status: Normal mood and affect. Normal behavior. Normal judgment and thought content.   Assessment and Plan:  Pregnancy: G1P0 at [redacted]w[redacted]d  1. Pap smear for cervical cancer screening - Cytology - PAP([place])  2. Screening for STDs (sexually transmitted diseases) - Cervicovaginal ancillary only  3. Pre-existing type 2 diabetes mellitus during pregnancy in second trimester Discussed appropriate glycemic control targets for pregnancy. Reviewed diabetic food choices and encouraged small, but more frequent meals in DM and pregnancy. Advised to increase insulin to 22u per day. Start keeping CBG log as opposed to recall. Return in <2 weeks for follow up. Refer to [facility] diabetic educator.   4. Pregnancy resulting from in vitro fertilization, second trimester  5. Intramural leiomyoma of uterus  6. Supervision of high risk pregnancy in second trimester Discussed lab results and anatomy scan results with patient.  - SMN1 Copy Number Analysis - AFP TETRA  Preterm labor symptoms and general obstetric precautions including but not limited to vaginal bleeding, contractions, leaking of fluid and fetal movement were reviewed in detail with the patient. Please refer to After Visit Summary for other counseling recommendations.   Return in about 12 days (around 07/18/2020) for OBGYN f/up[40 mins, dad can come].  Future Appointments  Date Time Provider [place]  07/18/2020 10:30 AM Nicolette Bang, DO [facility] None  08/01/2020 11:00 AM [facility] NURSE [facility]  08/01/2020 11:15 AM [facility] US2 [facility]  12/21/2020 10:00 AM Vevelyn Francois, NP [place] None    Melina Schools,  DO

## 2020-07-06 NOTE — Telephone Encounter (Signed)
Took the [place] genetic testing to fedex tracking number is [phone]. I dropped it at [facility] on [address]

## 2020-07-11 ENCOUNTER — Telehealth: Payer: Self-pay | Admitting: Clinical

## 2020-07-11 LAB — CERVICOVAGINAL ANCILLARY ONLY
Bacterial Vaginitis (gardnerella): NEGATIVE
Candida Glabrata: NEGATIVE
Candida Vaginitis: NEGATIVE
Chlamydia: NEGATIVE
Comment: NEGATIVE
Comment: NEGATIVE
Comment: NEGATIVE
Comment: NEGATIVE
Comment: NEGATIVE
Comment: NORMAL
Neisseria Gonorrhea: NEGATIVE
Trichomonas: NEGATIVE

## 2020-07-11 NOTE — Telephone Encounter (Signed)
Integrated Behavioral Health General Follow Up Note  07/11/2020 Name: Tina Brown MRN: [id number] DOB: 13-Aug-1985 Tina Brown is a 34 y.o. year old female who sees Barbette Merino, NP for primary care. LCSW was initially consulted to assess patient's needs and assist the patient with financial difficulties related to lack of health coverage.  Interpreter: Yes.     Interpreter Name & Language: Arabic, [employer] [phone]  Assessment: Patient experiencing financial difficulties related to lack of health coverage. Patient recently approved for pregnancy Medicaid.   Ongoing Intervention: Patient LVM for CSW indicating she had a question about insurance. Today CSW returned call with interpreter. Patient reported she was approved for Medicaid, plan managed by [employer]. Patient reported she was referred for a diabetes specialist, but that provider won't take her new insurance. She also had questions about her ultrasound appointment. Advised patient of date and time of the ultrasound appointment. Patient not sure of which location to go to, stating someone had told her to go to the [facility] location. CSW to follow up on these referrals and insurance questions and will follow up with patient via phone.   Review of patient status, including review of consultants reports, relevant laboratory and other test results, and collaboration with appropriate care team members and the patient's provider was performed as part of comprehensive patient evaluation and provision of services.     Follow up Plan: 1. Will continue to follow.  Abigail Butts, LCSW [facility] [phone]

## 2020-07-12 ENCOUNTER — Telehealth: Payer: Self-pay | Admitting: Clinical

## 2020-07-12 LAB — CYTOLOGY - PAP
Comment: NEGATIVE
Diagnosis: NEGATIVE
High risk HPV: NEGATIVE

## 2020-07-12 LAB — RESULTS CONSOLE HPV: CHL HPV: NEGATIVE

## 2020-07-12 LAB — HM PAP SMEAR

## 2020-07-12 NOTE — Telephone Encounter (Signed)
Integrated Behavioral Health General Follow Up Note  07/12/2020 Name: Tina Brown MRN: [id number] DOB: 01-Apr-1986 Tina Brown is a 34 y.o. year old female who sees Barbette Merino, NP for primary care. LCSW was initially consulted to assess patient's needs and assist the patient with financial difficulties related to lack of health coverage.  Interpreter: Yes.     Interpreter Name & Language: Arabic, [employer] [phone]  Assessment: Patient experiencing financial difficulties related to lack of health coverage. Patient recently approved for pregnancy Medicaid.   Ongoing Intervention: CSW called patient today with interpreter. Advised patient of managed Medicaid plans that [facility] is in network with. Provided instruction on how patient may change her plan to an in-network plan if she would like to. Patient expressed understanding and had no additional questions.   Review of patient status, including review of consultants reports, relevant laboratory and other test results, and collaboration with appropriate care team members and the patient's provider was performed as part of comprehensive patient evaluation and provision of services.     Follow up Plan: 1. CSW available from clinic as needed.   Abigail Butts, LCSW [facility] [phone]

## 2020-07-13 ENCOUNTER — Telehealth: Payer: Self-pay

## 2020-07-13 NOTE — Telephone Encounter (Signed)
Called and spoke with pt and gave pt the the address and phone , as to were she needs to go for her appt on the 1/18. Pt understood

## 2020-07-13 NOTE — Telephone Encounter (Signed)
-----   Message from Abigail Butts, LCSW sent at 07/11/2020  9:37 AM EST ----- Good morning, this patient was referred for an ultrasound scheduled for 1/18. She's had some trouble understanding what location to go to? I spoke to her with an Arabic interpreter but I'm not completely sure how to advise her. It seems like she tried going to one ultrasound and at that location they told her she needs to go to "the [facility] location." I can't really tell in the appointment system who her appointment is with and where. Also wonder if its based on insurance. She didn't have coverage but got a managed Medicaid plan effective 06/14/20. It's [employer] which we're not in network with. Are you guys able to advise?

## 2020-07-18 ENCOUNTER — Other Ambulatory Visit: Payer: Self-pay

## 2020-07-18 ENCOUNTER — Other Ambulatory Visit: Payer: Self-pay | Admitting: Internal Medicine

## 2020-07-18 ENCOUNTER — Ambulatory Visit (INDEPENDENT_AMBULATORY_CARE_PROVIDER_SITE_OTHER): Payer: Medicaid Other | Admitting: Internal Medicine

## 2020-07-18 VITALS — BP 115/79 | HR 99 | Temp 97.5°F | Wt 159.4 lb

## 2020-07-18 DIAGNOSIS — Z3A24 24 weeks gestation of pregnancy: Secondary | ICD-10-CM

## 2020-07-18 DIAGNOSIS — O0992 Supervision of high risk pregnancy, unspecified, second trimester: Secondary | ICD-10-CM

## 2020-07-18 DIAGNOSIS — L282 Other prurigo: Secondary | ICD-10-CM

## 2020-07-18 DIAGNOSIS — O24112 Pre-existing diabetes mellitus, type 2, in pregnancy, second trimester: Secondary | ICD-10-CM

## 2020-07-18 MED ORDER — TRIAMCINOLONE ACETONIDE 0.1 % EX CREA: 1.0000 "application " | TOPICAL_CREAM | Freq: Two times a day (BID) | CUTANEOUS | 0 refills | Status: DC

## 2020-07-18 MED ORDER — LANTUS SOLOSTAR 100 UNIT/ML ~~LOC~~ SOPN
24.0000 [IU] | PEN_INJECTOR | Freq: Every day | SUBCUTANEOUS | 11 refills | Status: DC
Start: 2020-07-18 — End: 2020-08-10

## 2020-07-18 MED FILL — $LANTUS SOLOSTAR 100 UNITS/: 100 | 30 days supply | Qty: 6 | Fill #1

## 2020-07-18 MED FILL — TRIAMCINOLONE 0.1% CREAM: 0.1 | 14 days supply | Qty: 30 | Fill #0

## 2020-07-18 NOTE — Progress Notes (Signed)
PRENATAL VISIT NOTE  Subjective:  Tina Brown is a 35 y.o. G1P0 at [redacted]w[redacted]d being seen today for ongoing prenatal care.  She is currently monitored for the following issues for this high-risk pregnancy and has Diabetes mellitus with complication ([place]); High-risk pregnancy supervision; Diabetes in pregnancy; Advanced maternal age, 1st pregnancy; Pregnancy resulting from in vitro fertilization, second trimester; and Intramural leiomyoma of uterus on their problem list.  Patient reports pruritic rash present on her forearms and shins. Has been present for a couple weeks. No changes in soaps, detergents, skin care products. No contacts with known allergic triggers. No contacts with similar rash. Denies widespread itching or in areas without the rash.  Contractions: Not present.  .  Movement: Present. Denies leaking of fluid.    Reviewed glucose log. Fasting CBGs 80-120s. Average around 110. 2 hr postprandial CBGs average 140s with outliers of 90, 110, and 180.    The following portions of the patient's history were reviewed and updated as appropriate: allergies, current medications, past family history, past medical history, past social history, past surgical history and problem list.   Objective:   Vitals:   07/18/20 1056  BP: 115/79  Pulse: 99  Temp: (!) 97.5 F (36.4 C)  Weight: 159 lb 6.4 oz (72.3 kg)    Fetal Status: Fetal Heart Rate (bpm): 159   Movement: Present     General:  Alert, oriented and cooperative. Patient is in no acute distress.  Skin: Skin is warm and dry. No rash noted.   Cardiovascular: Normal heart rate noted  Respiratory: Normal respiratory effort, no problems with respiration noted  Abdomen: Soft, gravid, appropriate for gestational age.  Pain/Pressure: Absent     Pelvic: Cervical exam deferred        Extremities: Normal range of motion.  Edema: None  Skin: Small papules and macules present across forearms and shins. Some are erythematous and some have  overlying excoriations. No rash in web spaces of fingers and toes. No involvement of soles or palms of hands.   Mental Status: Normal mood and affect. Normal behavior. Normal judgment and thought content.   Assessment and Plan:  Pregnancy: G1P0 at [redacted]w[redacted]d  1. Supervision of high risk pregnancy in second trimester Horizon and Panorama tests pending.   2. [redacted] weeks gestation of pregnancy   3. Pre-existing type 2 diabetes mellitus during pregnancy in second trimester Reviewed CBG log, still uncontrolled for pregnancy. Reviewed goal CBG parameters with patient. Increase Lantus from 22 to 24u daily. Referral placed for diabetic education at [facility] as I have some concerns about patient's grasp on appropriate nutrition with her underlying condition especially given language barrier. Has follow up growth scan scheduled with MFM for 1/18.  - insulin glargine (LANTUS SOLOSTAR) 100 UNIT/ML Solostar Pen; Inject 24 Units into the skin at bedtime.  Dispense: 7.2 mL; Refill: 11 - Ambulatory referral to diabetic education     4. Pruritic rash Suspect some type of contact or irritant dermatitis given appearance of rash. Considered cholestasis of pregnancy; however much less likely given skin changes associated with pruritis. Will treat with steroid ointment. If does not improve, could consider drawing bile acid level.   - triamcinolone (KENALOG) 0.1 %; Apply 1 application topically 2 (two) times daily.  Dispense: 30 g; Refill: 0     Preterm labor symptoms and general obstetric precautions including but not limited to vaginal bleeding, contractions, leaking of fluid and fetal movement were reviewed in detail with the patient. Please refer  to After Visit Summary for other counseling recommendations.   No follow-ups on file.  Future Appointments  Date Time Provider Department Center  08/01/2020 11:00 AM [facility] NURSE [facility]  08/01/2020 11:15 AM [facility] US2 [facility]  12/21/2020 10:00  AM Barbette Merino, NP [facility] None    De Hollingshead, DO

## 2020-07-18 NOTE — Progress Notes (Signed)
Reports rash w/ itching to hands/arms/feet x 2 wks

## 2020-07-19 LAB — AFP TETRA
DIA Value (EIA): 311.34 pg/mL
DSR (By Age)    1 IN: 175
Gestational Age: 22.3 WEEKS
MSAFP Mom: 2.75
MSAFP: 182.6 ng/mL
MSHCG: 34632 m[IU]/mL
Maternal Age At EDD: 37.3 yr
Osb Risk: 108
T18 (By Age): 1:683 {titer}
Test Results:: POSITIVE — AB
Weight: 156 [lb_av]
uE3 Value: 2.12 ng/mL

## 2020-07-19 LAB — SMN1 COPY NUMBER ANALYSIS (SMA CARRIER SCREENING)

## 2020-07-20 ENCOUNTER — Encounter: Payer: Medicaid Other | Attending: Internal Medicine | Admitting: Skilled Nursing Facility1

## 2020-07-20 ENCOUNTER — Encounter: Payer: Self-pay | Admitting: Skilled Nursing Facility1

## 2020-07-20 ENCOUNTER — Other Ambulatory Visit: Payer: Self-pay

## 2020-07-20 DIAGNOSIS — O24112 Pre-existing diabetes mellitus, type 2, in pregnancy, second trimester: Secondary | ICD-10-CM

## 2020-07-20 DIAGNOSIS — Z3A00 Weeks of gestation of pregnancy not specified: Secondary | ICD-10-CM | POA: Diagnosis not present

## 2020-07-20 NOTE — Progress Notes (Signed)
Patient was seen on 07/21/2019 for Gestational Diabetes self-management at [facility].   Pt states she is [redacted] weeks along. Pt states her A1C before pregnancy was 6. something pt does not really remember.  Pt states she checks her blood sugars 2 times a day: fasting: 67, 75, 117, 88, 114, 125, 115 and 2 hours post prandial breakfast: 114, 175, 144, 180, 167, 155 Pt states she has a small portion of non starchy vegetables and bigger portions of her carb foods and fries those foods often.  Pt states she has pinpointed cows milk raises her blood sugars: Dietitian advised pt to try unsweetened soy milk first and if she does not like that to try almond milk.   Sometimes she eats an orange and in the morning sees her sugar gets high.   Pt took good notes and asked relevant questions.  24 hr recall: First meal: oatmeal + whole milk Snack 9-10am: 2-3 eggs + homemade yogurt   Second meal: goat or sheep meat + bread Snack: deep fried bean Dinner: same as lunch  Beverages: tea + milk, water  Education Topics: Self monitoring hygiene  Stress management as it pertains to blood sugar control Importance of prevention: checking feet, going to the doctor and eye doctor Fats/Oils and appropriate portions  Importance of physical activity within the context of blood sugar control Hypoglycemia and correcting it Fruit and hormonal changes specifically in morning hours  Objective met:  States why dietary management is important in controlling blood glucose  Describes the effects each nutrient has on blood glucose levels  Demonstrates ability to create a balanced meal plan  Demonstrates carbohydrate counting   States when to check blood glucose levels  Demonstrates proper blood glucose monitoring techniques  States the effect of stress and exercise on blood glucose levels  States the importance of limiting caffeine and abstaining from alcohol and smoking  Patient  instructed to monitor glucose levels: FBS: 60 - <95 1 hour: <140 2 hour: <120  *Patient received handouts:  Nutrition Diabetes and Pregnancy  Carbohydrate Counting List  Patient will be seen for follow-up as needed.    [place] interprestor: [phone] [facility]

## 2020-07-21 ENCOUNTER — Telehealth: Payer: Self-pay | Admitting: Clinical

## 2020-07-21 NOTE — Telephone Encounter (Signed)
Integrated Behavioral Health General Follow Up Note  07/21/2020 Name: Althea Backs MRN: [id number] DOB: 09-Dec-1985 Katerine Morua is a 35 y.o. year old female who sees Vevelyn Francois, NP for primary care. LCSW was initially consulted to assess patient's needs and assist the patient with financial difficulties related to lack of health coverage.  Interpreter: Yes.     Interpreter Name & Language: Arabic, [employer], [place] #[id number]  Assessment: Patient experiencing financial difficulties related to lack of health coverage. Patient recently approved for pregnancy Medicaid. She initially was signed up for [employer], but wanted to change the plan to one in network with [facility], and changed to [facility]   Ongoing Intervention: Patient called CSW and requested assistance, as she changed her managed Medicaid plan to [facility], but received a new insurance card that still reflected her old plan. CSW called patient back with interpreter. Along with patient and interpreter, called [employer] and ordered a new insurance card for patient. Patient reported no additional needs at this time.   Review of patient status, including review of consultants reports, relevant laboratory and other test results, and collaboration with appropriate care team members and the patient's provider was performed as part of comprehensive patient evaluation and provision of services.     Follow up Plan: 1. CSW available from clinic as needed.   Estanislado Emms, [place] [facility] [phone]

## 2020-07-26 ENCOUNTER — Encounter: Payer: Self-pay | Admitting: Internal Medicine

## 2020-07-26 DIAGNOSIS — O280 Abnormal hematological finding on antenatal screening of mother: Secondary | ICD-10-CM | POA: Insufficient documentation

## 2020-07-27 ENCOUNTER — Ambulatory Visit: Payer: Medicaid Other

## 2020-07-28 ENCOUNTER — Other Ambulatory Visit: Payer: Self-pay | Admitting: Internal Medicine

## 2020-07-28 DIAGNOSIS — O0992 Supervision of high risk pregnancy, unspecified, second trimester: Secondary | ICD-10-CM

## 2020-07-28 MED FILL — METFORMIN HCL 1000 MG TABS: 1000 | 30 days supply | Qty: 60 | Fill #1

## 2020-07-31 ENCOUNTER — Other Ambulatory Visit: Payer: Self-pay | Admitting: Internal Medicine

## 2020-07-31 MED FILL — PRENATAL 27-1 MG TABS: 27-1 | 30 days supply | Qty: 30 | Fill #0

## 2020-08-01 ENCOUNTER — Other Ambulatory Visit: Payer: Self-pay | Admitting: *Deleted

## 2020-08-01 ENCOUNTER — Other Ambulatory Visit: Payer: Self-pay

## 2020-08-01 ENCOUNTER — Encounter: Payer: Medicaid Other | Admitting: Internal Medicine

## 2020-08-01 ENCOUNTER — Ambulatory Visit: Payer: Medicaid Other | Attending: Obstetrics and Gynecology

## 2020-08-01 ENCOUNTER — Encounter: Payer: Self-pay | Admitting: *Deleted

## 2020-08-01 ENCOUNTER — Ambulatory Visit: Payer: Medicaid Other | Admitting: *Deleted

## 2020-08-01 VITALS — BP 120/68 | HR 84

## 2020-08-01 DIAGNOSIS — O24912 Unspecified diabetes mellitus in pregnancy, second trimester: Secondary | ICD-10-CM | POA: Insufficient documentation

## 2020-08-01 DIAGNOSIS — O24414 Gestational diabetes mellitus in pregnancy, insulin controlled: Secondary | ICD-10-CM

## 2020-08-01 DIAGNOSIS — O281 Abnormal biochemical finding on antenatal screening of mother: Secondary | ICD-10-CM

## 2020-08-01 DIAGNOSIS — Z794 Long term (current) use of insulin: Secondary | ICD-10-CM

## 2020-08-01 DIAGNOSIS — O09812 Supervision of pregnancy resulting from assisted reproductive technology, second trimester: Secondary | ICD-10-CM

## 2020-08-01 DIAGNOSIS — O09522 Supervision of elderly multigravida, second trimester: Secondary | ICD-10-CM

## 2020-08-01 DIAGNOSIS — E119 Type 2 diabetes mellitus without complications: Secondary | ICD-10-CM

## 2020-08-01 DIAGNOSIS — Z3A26 26 weeks gestation of pregnancy: Secondary | ICD-10-CM

## 2020-08-01 DIAGNOSIS — Z362 Encounter for other antenatal screening follow-up: Secondary | ICD-10-CM

## 2020-08-01 DIAGNOSIS — O24112 Pre-existing diabetes mellitus, type 2, in pregnancy, second trimester: Secondary | ICD-10-CM

## 2020-08-03 ENCOUNTER — Other Ambulatory Visit: Payer: Self-pay | Admitting: Internal Medicine

## 2020-08-03 ENCOUNTER — Other Ambulatory Visit: Payer: Self-pay

## 2020-08-03 ENCOUNTER — Ambulatory Visit (INDEPENDENT_AMBULATORY_CARE_PROVIDER_SITE_OTHER): Payer: Medicaid Other | Admitting: Internal Medicine

## 2020-08-03 VITALS — BP 114/75 | HR 103 | Temp 98.3°F | Ht 67.0 in | Wt 162.4 lb

## 2020-08-03 DIAGNOSIS — O280 Abnormal hematological finding on antenatal screening of mother: Secondary | ICD-10-CM

## 2020-08-03 DIAGNOSIS — O0992 Supervision of high risk pregnancy, unspecified, second trimester: Secondary | ICD-10-CM

## 2020-08-03 DIAGNOSIS — O24112 Pre-existing diabetes mellitus, type 2, in pregnancy, second trimester: Secondary | ICD-10-CM

## 2020-08-03 MED ORDER — PRENATAL 27-1 MG PO TABS: ORAL_TABLET | ORAL | 11 refills | Status: DC

## 2020-08-03 NOTE — Progress Notes (Signed)
   PRENATAL VISIT NOTE  Subjective:  Tina Brown is a 35 y.o. G1P0 at [redacted]w[redacted]d being seen today for ongoing prenatal care.  She is currently monitored for the following issues for this high-risk pregnancy and has Diabetes mellitus with complication ([facility]); High-risk pregnancy supervision; Diabetes in pregnancy; Advanced maternal age, 1st pregnancy; Pregnancy resulting from in vitro fertilization, second trimester; Intramural leiomyoma of uterus; and Abnormal antenatal AFP screen on their problem list.  Patient reports no complaints.  Contractions: Not present. Vag. Bleeding: None.  Movement: Present. Denies leaking of fluid.    Patient forgot CBG log. However, reports blood sugars are better. Has been compliant with insulin. Reports fasting CBGs 80-85. Highest in last couple weeks was 95. Postprandial CBGs typically in 120s. She will bring log at the next visit.   The following portions of the patient's history were reviewed and updated as appropriate: allergies, current medications, past family history, past medical history, past social history, past surgical history and problem list.   Objective:   Vitals:   08/03/20 1500 08/03/20 1505  BP: 114/75   Pulse: (!) 103   Temp: 98.3 F (36.8 C)   Weight: 162 lb 6.4 oz (73.7 kg)   Height:  5\' 7"  (1.702 m)    Fetal Status: Fetal Heart Rate (bpm): 145   Movement: Present     General:  Alert, oriented and cooperative. Patient is in no acute distress.  Skin: Skin is warm and dry. No rash noted.   Cardiovascular: Normal heart rate noted  Respiratory: Normal respiratory effort, no problems with respiration noted  Abdomen: Soft, gravid, appropriate for gestational age.  Pain/Pressure: Absent     Pelvic: Cervical exam deferred        Extremities: Normal range of motion.  Edema: None  Mental Status: Normal mood and affect. Normal behavior. Normal judgment and thought content.   Assessment and Plan:  Pregnancy: G1P0 at [redacted]w[redacted]d  1. Supervision of  high risk pregnancy in second trimester Discussed with patient will plan to obtain 28 week labs and administer TDap at next visit.  - Prenatal 27-1 MG TABS; TAKE 1 TABLET BY MOUTH DAILY AT 12 NOON.  Dispense: 30 tablet; Refill: 11  2. Pre-existing type 2 diabetes mellitus during pregnancy in second trimester CBG with much more optimal control. Has seen nutritionist and had fetal echo performed. Continue Lantus 24u. Bring glucose log for review to next appointment.   3. Abnormal antenatal AFP screen Serum alpha-fetoprotein increased. Risk of fetal aneuploidies on cell free DNA was not increased. Consulted with MFM. No obvious spinal defect present on ultrasound;however, has been difficult to evaluate due to fetal positioning. 4 week f/u scheduled.    Preterm labor symptoms and general obstetric precautions including but not limited to vaginal bleeding, contractions, leaking of fluid and fetal movement were reviewed in detail with the patient. Please refer to After Visit Summary for other counseling recommendations.   No follow-ups on file.  Future Appointments  Date Time Provider [place]  08/17/2020  9:30 AM Nicolette Bang, DO [facility] None  08/29/2020  2:15 PM [facility] NURSE [facility]  08/29/2020  2:30 PM [facility] US3 [facility]  09/12/2020  2:15 PM [facility] NURSE [facility]  09/12/2020  2:30 PM [facility] US3 [facility]  09/19/2020  2:15 PM [facility] NURSE [facility]  09/19/2020  2:30 PM [facility] US3 [facility]  12/21/2020 10:00 AM Vevelyn Francois, NP [place] None    Melina Schools, DO

## 2020-08-03 NOTE — Progress Notes (Signed)
Needs refill on prenatal vitamin

## 2020-08-10 ENCOUNTER — Telehealth: Payer: Self-pay | Admitting: Nurse Practitioner

## 2020-08-10 ENCOUNTER — Other Ambulatory Visit: Payer: Self-pay

## 2020-08-10 ENCOUNTER — Telehealth (INDEPENDENT_AMBULATORY_CARE_PROVIDER_SITE_OTHER): Payer: Self-pay

## 2020-08-10 DIAGNOSIS — O24112 Pre-existing diabetes mellitus, type 2, in pregnancy, second trimester: Secondary | ICD-10-CM

## 2020-08-10 MED ORDER — LANTUS SOLOSTAR 100 UNIT/ML ~~LOC~~ SOPN: 24.0000 [IU] | PEN_INJECTOR | Freq: Every day | SUBCUTANEOUS | 11 refills | Status: DC

## 2020-08-10 MED FILL — $LANTUS SOLOSTAR 100 UNITS/: 100 | 25 days supply | Qty: 6 | Fill #0

## 2020-08-10 NOTE — Telephone Encounter (Signed)
Contacted patient with assistance of pacific interpreter [phone]). Patient verified date of birth. She verified date of birth. She is aware that pap is normal, repeat in 3-5 years. No bacteria or yeast. She verbalized understanding. Nat Christen, CMA

## 2020-08-10 NOTE — Telephone Encounter (Signed)
-----   Message from Carilyn Goodpasture, RN sent at 07/31/2020  6:32 PM EST -----  ----- Message ----- From: Nicolette Bang, DO Sent: 07/12/2020   1:33 PM EST To: Carylon Perches, CMA  Please call [name] about PAP results: Negative for malignancy/lesions/HPV. Repeat PAP in 3-5 years. Vaginal swab did not show any bacterial or yeast.   .   Thanks,  Phill Myron, D.O. Primary Care at [facility]  07/12/2020, 1:33 PM

## 2020-08-10 NOTE — Telephone Encounter (Signed)
Pt is requesting a refill for insulin glargine (LANTUS SOLOSTAR) 100 UNIT/ML Solostar Pen [[id number]]    Pharmacy [place]. Please advise and thank you

## 2020-08-10 NOTE — Telephone Encounter (Signed)
She has eleven refills she needs to call the pharmacy  Thanks

## 2020-08-10 NOTE — Progress Notes (Signed)
Refill completed.

## 2020-08-16 ENCOUNTER — Other Ambulatory Visit: Payer: Self-pay

## 2020-08-17 ENCOUNTER — Other Ambulatory Visit: Payer: Self-pay

## 2020-08-17 ENCOUNTER — Ambulatory Visit (INDEPENDENT_AMBULATORY_CARE_PROVIDER_SITE_OTHER): Payer: Medicaid Other | Admitting: Internal Medicine

## 2020-08-17 ENCOUNTER — Encounter: Payer: Self-pay | Admitting: Internal Medicine

## 2020-08-17 VITALS — BP 123/79 | HR 105 | Temp 98.4°F | Wt 162.6 lb

## 2020-08-17 DIAGNOSIS — O26893 Other specified pregnancy related conditions, third trimester: Secondary | ICD-10-CM

## 2020-08-17 DIAGNOSIS — O24113 Pre-existing diabetes mellitus, type 2, in pregnancy, third trimester: Secondary | ICD-10-CM

## 2020-08-17 DIAGNOSIS — Z23 Encounter for immunization: Secondary | ICD-10-CM

## 2020-08-17 DIAGNOSIS — R12 Heartburn: Secondary | ICD-10-CM

## 2020-08-17 DIAGNOSIS — O0993 Supervision of high risk pregnancy, unspecified, third trimester: Secondary | ICD-10-CM

## 2020-08-17 MED ORDER — LANTUS SOLOSTAR 100 UNIT/ML ~~LOC~~ SOPN: 28.0000 [IU] | PEN_INJECTOR | Freq: Every day | SUBCUTANEOUS | 11 refills | Status: DC

## 2020-08-17 MED ORDER — FAMOTIDINE 20 MG PO TABS: 20.0000 mg | ORAL_TABLET | Freq: Every day | ORAL | 0 refills | Status: DC

## 2020-08-17 MED ORDER — LANTUS SOLOSTAR 100 UNIT/ML ~~LOC~~ SOPN
28.0000 [IU] | PEN_INJECTOR | Freq: Every day | SUBCUTANEOUS | 11 refills | Status: DC
Start: 2020-08-17 — End: 2020-08-17

## 2020-08-17 MED FILL — FAMOTIDINE 20 MG TABS: 20 | 30 days supply | Qty: 30 | Fill #0

## 2020-08-17 NOTE — Progress Notes (Signed)
Rx need to be update to 69ml not 8.60ml

## 2020-08-17 NOTE — Progress Notes (Signed)
Reported AM FBS reading of 180  Reports worsening nausea/vomiting/heartburn, vomits up to 3x/day and has incontinence episodes during vomiting

## 2020-08-17 NOTE — Progress Notes (Signed)
PRENATAL VISIT NOTE  Subjective:  Tina Brown is a 35 y.o. G1P0 at [redacted]w[redacted]d being seen today for ongoing prenatal care.  She is currently monitored for the following issues for this high-risk pregnancy and has Diabetes mellitus with complication ([place]); High-risk pregnancy supervision; Diabetes in pregnancy; Advanced maternal age, 1st pregnancy; Pregnancy resulting from in vitro fertilization, second trimester; Intramural leiomyoma of uterus; and Abnormal antenatal AFP screen on their problem list.   Depression screen [facility] 2/9 08/17/2020 08/17/2020 08/03/2020  Decreased Interest 0 0 0  Down, Depressed, Hopeless 0 0 0  PHQ - 2 Score 0 0 0  Altered sleeping 0 0 0  Tired, decreased energy 0 0 0  Change in appetite 0 0 0  Feeling bad or failure about yourself  0 0 0  Trouble concentrating 0 0 0  Moving slowly or fidgety/restless 0 0 0  Suicidal thoughts 0 0 0  PHQ-9 Score 0 0 0  Difficult doing work/chores Not difficult at all - -  Some recent data might be hidden    Patient reports heartburn. Started 3 days ago. Has had a lot of burning sensation. Has vomited 30 min to 1 hour after eating. Has not tried any medications.  Patient presents with blood sugar log. Fasting CBGs mostly <110, however does have some in the mid 100s. Reports she thinks this is related to her vomiting her Metformin. 2 hours after eating, her CBG log shows that she has CBGs 114-150.    Contractions: Not present. Vag. Bleeding: None.  Movement: Present. Denies leaking of fluid.   The following portions of the patient's history were reviewed and updated as appropriate: allergies, current medications, past family history, past medical history, past social history, past surgical history and problem list.   Objective:   Vitals:   08/17/20 0942  BP: 123/79  Pulse: (!) 105  Temp: 98.4 F (36.9 C)  Weight: 162 lb 9.6 oz (73.8 kg)    Fetal Status: Fetal Heart Rate (bpm): 158 Fundal Height: 29 cm Movement: Present      General:  Alert, oriented and cooperative. Patient is in no acute distress.  Skin: Skin is warm and dry. No rash noted.   Cardiovascular: Normal heart rate noted  Respiratory: Normal respiratory effort, no problems with respiration noted  Abdomen: Soft, gravid, appropriate for gestational age.  Pain/Pressure: Absent     Pelvic: Cervical exam deferred        Extremities: Normal range of motion.  Edema: None  Mental Status: Normal mood and affect. Normal behavior. Normal judgment and thought content.   Assessment and Plan:  Pregnancy: G1P0 at [redacted]w[redacted]d  1. Supervision of high risk pregnancy in third trimester - RPR - HIV Antibody (routine testing w rflx) - CBC - Hepatitis C antibody  2. Pre-existing type 2 diabetes mellitus during pregnancy in third trimester Discussed at length glucose control parameters and reviewed diabetic diet again. Will increase Lantus from 24 to 28u daily. Discussed that if fasting CBGs remain on average above 95 after increase or if 2h PP glucose checks remain above 120 on average after this dose increase needs to let me know next week between appointments for further adjustment of medications.  Has ultrasound scheduled with MFM 2/15 for f/u growth scan.  Start weekly BPPs at 32w.  Fetal echo was completed and normal.  - insulin glargine (LANTUS SOLOSTAR) 100 UNIT/ML Solostar Pen; Inject 28 Units into the skin at bedtime.  Dispense: 8.4 mL; Refill: 11   3. Need  for Tdap vaccination - Tdap vaccine greater than or equal to 7yo IM  4. Heartburn during pregnancy in third trimester Discussed measures for reducing heartburn. Start H2 blocker for symptom management.  - famotidine (PEPCID) 20 MG tablet; Take 1 tablet (20 mg total) by mouth daily.  Dispense: 90 tablet; Refill: 0    Preterm labor symptoms and general obstetric precautions including but not limited to vaginal bleeding, contractions, leaking of fluid and fetal movement were reviewed in detail with the  patient. Please refer to After Visit Summary for other counseling recommendations.   Return in about 2 weeks (around 08/31/2020) for high risk OB.  Future Appointments  Date Time Provider [place]  08/29/2020  2:15 PM [facility] NURSE [facility]  08/29/2020  2:30 PM [facility] US3 [facility]  08/31/2020  2:50 PM Nicolette Bang, DO PCE-PCE None  09/12/2020  2:15 PM [facility] NURSE [facility]  09/12/2020  2:30 PM [facility] US3 [facility]  09/19/2020  2:15 PM [facility] NURSE [facility]  09/19/2020  2:30 PM [facility] US3 [facility]  12/21/2020 10:00 AM Vevelyn Francois, NP [place] None    Melina Schools, DO

## 2020-08-18 LAB — CBC
Hematocrit: 35.5 % (ref 34.0–46.6)
Hemoglobin: 12 g/dL (ref 11.1–15.9)
MCH: 28.5 pg (ref 26.6–33.0)
MCHC: 33.8 g/dL (ref 31.5–35.7)
MCV: 84 fL (ref 79–97)
Platelets: 267 10*3/uL (ref 150–450)
RBC: 4.21 x10E6/uL (ref 3.77–5.28)
RDW: 14 % (ref 11.7–15.4)
WBC: 5.6 10*3/uL (ref 3.4–10.8)

## 2020-08-18 LAB — RPR: RPR Ser Ql: NONREACTIVE

## 2020-08-18 LAB — HIV ANTIBODY (ROUTINE TESTING W REFLEX): HIV Screen 4th Generation wRfx: NONREACTIVE

## 2020-08-18 LAB — HEPATITIS C ANTIBODY: Hep C Virus Ab: 0.2 s/co ratio (ref 0.0–0.9)

## 2020-08-24 ENCOUNTER — Telehealth: Payer: Self-pay | Admitting: Nurse Practitioner

## 2020-08-24 ENCOUNTER — Other Ambulatory Visit: Payer: Self-pay | Admitting: Internal Medicine

## 2020-08-24 DIAGNOSIS — O24112 Pre-existing diabetes mellitus, type 2, in pregnancy, second trimester: Secondary | ICD-10-CM

## 2020-08-24 MED ORDER — METFORMIN HCL 1000 MG PO TABS: 1000.0000 mg | ORAL_TABLET | Freq: Two times a day (BID) | ORAL | 3 refills | Status: DC

## 2020-08-24 MED FILL — METFORMIN HCL 1000 MG TABS: 1000 | 30 days supply | Qty: 60 | Fill #0

## 2020-08-24 NOTE — Telephone Encounter (Signed)
Pt wants to inform Dr. Juleen China of  High Glucose levels n the morning 160, After breakfast 175. (She states she was told to inform Dr. Juleen China if it was high).   Pt also is in need of:  metFORMIN (GLUCOPHAGE) 1000 MG tablet , please send to [facility] when possible. Thank you.

## 2020-08-24 NOTE — Telephone Encounter (Signed)
Metformin RF sent, pls see FYI re: blood sugar readings

## 2020-08-25 ENCOUNTER — Other Ambulatory Visit: Payer: Self-pay

## 2020-08-25 DIAGNOSIS — O24113 Pre-existing diabetes mellitus, type 2, in pregnancy, third trimester: Secondary | ICD-10-CM

## 2020-08-25 MED ORDER — LANTUS SOLOSTAR 100 UNIT/ML ~~LOC~~ SOPN: 32.0000 [IU] | PEN_INJECTOR | Freq: Every day | SUBCUTANEOUS | 11 refills | Status: DC

## 2020-08-25 MED FILL — $LANTUS SOLOSTAR 100 UNITS/: 100 | 28 days supply | Qty: 9 | Fill #0

## 2020-08-25 NOTE — Telephone Encounter (Signed)
Please tell patient to increase Lantus to 32 units daily. Please see if she can get another appointment with diabetic educator at the [facility].   Phill Myron, D.O. Primary Care at [facility]  08/25/2020, 10:38 AM

## 2020-08-25 NOTE — Telephone Encounter (Signed)
[facility] nutrition/diabetes services to schedule appt for pt, no answer, LM to [facility]. Will follow-up.

## 2020-08-25 NOTE — Telephone Encounter (Signed)
Contact pt via [employer] ([place], [phone]) and informed of new instructions, sent updated script w/ new dose to [facility] pharmacy.

## 2020-08-28 NOTE — Telephone Encounter (Signed)
Appt w/ diabetes educator schedule at [facility] on 2/24 @ 10:15am. Contacted pt via [employer] ([place], [place]) and gave appt info.

## 2020-08-29 ENCOUNTER — Ambulatory Visit: Payer: Medicaid Other | Admitting: *Deleted

## 2020-08-29 ENCOUNTER — Other Ambulatory Visit: Payer: Self-pay

## 2020-08-29 ENCOUNTER — Ambulatory Visit: Payer: Medicaid Other | Attending: Obstetrics and Gynecology

## 2020-08-29 ENCOUNTER — Encounter: Payer: Self-pay | Admitting: *Deleted

## 2020-08-29 VITALS — BP 125/79 | HR 98

## 2020-08-29 DIAGNOSIS — D259 Leiomyoma of uterus, unspecified: Secondary | ICD-10-CM

## 2020-08-29 DIAGNOSIS — O288 Other abnormal findings on antenatal screening of mother: Secondary | ICD-10-CM

## 2020-08-29 DIAGNOSIS — O3413 Maternal care for benign tumor of corpus uteri, third trimester: Secondary | ICD-10-CM

## 2020-08-29 DIAGNOSIS — O09523 Supervision of elderly multigravida, third trimester: Secondary | ICD-10-CM | POA: Diagnosis present

## 2020-08-29 DIAGNOSIS — Z794 Long term (current) use of insulin: Secondary | ICD-10-CM | POA: Diagnosis not present

## 2020-08-29 DIAGNOSIS — O24414 Gestational diabetes mellitus in pregnancy, insulin controlled: Secondary | ICD-10-CM | POA: Diagnosis present

## 2020-08-29 DIAGNOSIS — Z3A30 30 weeks gestation of pregnancy: Secondary | ICD-10-CM

## 2020-08-29 DIAGNOSIS — O24113 Pre-existing diabetes mellitus, type 2, in pregnancy, third trimester: Secondary | ICD-10-CM | POA: Diagnosis not present

## 2020-08-29 DIAGNOSIS — O09813 Supervision of pregnancy resulting from assisted reproductive technology, third trimester: Secondary | ICD-10-CM

## 2020-08-30 ENCOUNTER — Other Ambulatory Visit: Payer: Self-pay | Admitting: *Deleted

## 2020-08-30 DIAGNOSIS — R772 Abnormality of alphafetoprotein: Secondary | ICD-10-CM

## 2020-08-31 ENCOUNTER — Other Ambulatory Visit: Payer: Self-pay

## 2020-08-31 ENCOUNTER — Ambulatory Visit (INDEPENDENT_AMBULATORY_CARE_PROVIDER_SITE_OTHER): Payer: Medicaid Other | Admitting: Internal Medicine

## 2020-08-31 ENCOUNTER — Encounter: Payer: Self-pay | Admitting: Internal Medicine

## 2020-08-31 VITALS — BP 120/83 | HR 107 | Temp 97.8°F | Wt 164.4 lb

## 2020-08-31 DIAGNOSIS — O3663X0 Maternal care for excessive fetal growth, third trimester, not applicable or unspecified: Secondary | ICD-10-CM | POA: Insufficient documentation

## 2020-08-31 DIAGNOSIS — O0993 Supervision of high risk pregnancy, unspecified, third trimester: Secondary | ICD-10-CM

## 2020-08-31 DIAGNOSIS — O24113 Pre-existing diabetes mellitus, type 2, in pregnancy, third trimester: Secondary | ICD-10-CM

## 2020-08-31 NOTE — Progress Notes (Signed)
Routine prenatal

## 2020-08-31 NOTE — Progress Notes (Signed)
PRENATAL VISIT NOTE  Subjective:  Tina Brown is a 35 y.o. G1P0 at [redacted]w[redacted]d being seen today for ongoing prenatal care.  She is currently monitored for the following issues for this high-risk pregnancy and has Diabetes mellitus with complication ([place]); High-risk pregnancy supervision; Diabetes in pregnancy; Advanced maternal age, 1st pregnancy; Pregnancy resulting from in vitro fertilization, second trimester; Intramural leiomyoma of uterus; Abnormal antenatal AFP screen; and Excessive fetal growth affecting management of mother in third trimester, antepartum on their problem list.  Patient reports no complaints.  Contractions: Not present. Vag. Bleeding: None.  Movement: Present. Denies leaking of fluid.   Patient increased to 32u Lantus last week after elevated CBGs noted at home. Fasting CBGs since dose change have been 80s-90s, with occasional number in 120s. 2hr PP breakfast blood sugar has been low 120s-low 140s. She does not have any recordings for PP lunch or dinner.   The following portions of the patient's history were reviewed and updated as appropriate: allergies, current medications, past family history, past medical history, past social history, past surgical history and problem list.   Objective:   Vitals:   08/31/20 1457  BP: 120/83  Pulse: (!) 107  Temp: 97.8 F (36.6 C)  Weight: 164 lb 6.4 oz (74.6 kg)    Fetal Status: Fetal Heart Rate (bpm): 160   Movement: Present     General:  Alert, oriented and cooperative. Patient is in no acute distress.  Skin: Skin is warm and dry. No rash noted.   Cardiovascular: Normal heart rate noted  Respiratory: Normal respiratory effort, no problems with respiration noted  Abdomen: Soft, gravid, appropriate for gestational age.  Pain/Pressure: Absent     Pelvic: Cervical exam deferred        Extremities: Normal range of motion.  Edema: None  Mental Status: Normal mood and affect. Normal behavior. Normal judgment and thought content.    Assessment and Plan:  Pregnancy: G1P0 at [redacted]w[redacted]d   1. Supervision of high risk pregnancy in third trimester Preterm labor symptoms and general obstetric precautions including but not limited to vaginal bleeding, contractions, leaking of fluid and fetal movement were reviewed in detail with the patient. Please refer to After Visit Summary for other counseling recommendations.   2. Pre-existing type 2 diabetes mellitus during pregnancy in third trimester Patient continues to have sub-optimal control. In between visits, Lantus was increased to 32u. Will now increase to 36u and continue Metformin. Care is complicated by significant language barrier and concern about lack of understanding about appropriate diet. In addition, patient continues to check CBGs only twice per day. Had considered switching patient to NPH and Novolog regimen given more intermediate and short acting effects. However, very concerned that she may be unable to understand which insulin to administer when and at which dose. Could consider addition of Glyburide but this likely wouldn't provide as optimal glucose control but would be certainly easier to ensure patient compliance and understanding. Have discussed with Levie Heritage, RD. Unfortunately, does not have arabic insulin instructions. She is seeing patient in person next week and will review sugar log at that visit. Instructions sent to Eddie Dibbles, RN to remind patient to check CBGs 4x per day and to record. Asked to bring log to visit next week and to take pictures of each meal and snack between now and that visit. Again, reviewed risks of uncontrolled DM with patient to the fetus including hypoglycemia requiring NICU stay after birth, macrosomia with potential vaginal delivery complications or need  for primary CS, and IUFD.  - insulin glargine (LANTUS SOLOSTAR) 100 UNIT/ML Solostar Pen; Inject 36 Units into the skin at bedtime.  Dispense: 3 mL; Refill: 11  3. Excessive  fetal growth affecting management of pregnancy in third trimester, single or unspecified fetus Ultrasound from 2/15 showed EFW 97%/1960g at 30weeks. Discussed this result with patient and the potential risks associated with macrosomia. Advised on DM control as above. Continue to f/u with scans with MFM as scheduled.    Return in about 2 weeks (around 09/14/2020) for high risk OB (40 min appointment needed) .  Future Appointments  Date Time Provider [place]  09/07/2020 10:15 AM [facility]  09/12/2020  2:15 PM [facility] NURSE [facility]  09/12/2020  2:30 PM [facility] US3 [facility]  09/14/2020  2:50 PM Nicolette Bang, DO PCE-PCE None  09/19/2020  2:15 PM [facility] NURSE [facility]  09/19/2020  2:30 PM [facility] US3 [facility]  09/27/2020 11:00 AM W[facility]MFC NURSE W[facility]MFC [facility]  09/27/2020 11:15 AM [facility]FC US2 [facility]  12/21/2020 10:00 AM Vevelyn Francois, NP [place] None    Melina Schools, DO

## 2020-09-01 ENCOUNTER — Other Ambulatory Visit: Payer: Self-pay | Admitting: Internal Medicine

## 2020-09-01 ENCOUNTER — Telehealth: Payer: Self-pay

## 2020-09-01 MED ORDER — LANTUS SOLOSTAR 100 UNIT/ML ~~LOC~~ SOPN: 36.0000 [IU] | PEN_INJECTOR | Freq: Every day | SUBCUTANEOUS | 11 refills | Status: DC

## 2020-09-01 MED FILL — PRENATAL 27-1 MG TABS: 27-1 | 30 days supply | Qty: 30 | Fill #0

## 2020-09-01 NOTE — Telephone Encounter (Signed)
-----   Message from Nicolette Bang, DO sent at 09/01/2020 11:55 AM EST ----- Would you please call Tina Brown and let her know to increase her Lantus to 36 units tonight. Please retierate she needs to check her blood sugar 4x per day. Once fasting in AM prior to eating anything. Then needs to check 2 hours after each meal. She should bring this log to her diabetic appointment next week. I have discussed with Levie Heritage, RD who she will be seeing to update her on her care.   Thanks!   Phill Myron, D.O. Primary Care at [facility]  09/01/2020, 11:56 AM

## 2020-09-01 NOTE — Telephone Encounter (Signed)
Contacted pt via [employer] [place], [phone]) and informed of all instructions as well as taking pics of each meal to take with her to her appt w/ RD on 09-07-20. Pt verbalized understanding.

## 2020-09-07 ENCOUNTER — Ambulatory Visit: Payer: Medicaid Other | Admitting: Registered"

## 2020-09-07 ENCOUNTER — Encounter: Payer: Medicaid Other | Attending: Internal Medicine | Admitting: Registered"

## 2020-09-07 ENCOUNTER — Other Ambulatory Visit: Payer: Self-pay

## 2020-09-07 DIAGNOSIS — O24112 Pre-existing diabetes mellitus, type 2, in pregnancy, second trimester: Secondary | ICD-10-CM | POA: Insufficient documentation

## 2020-09-07 DIAGNOSIS — O24119 Pre-existing diabetes mellitus, type 2, in pregnancy, unspecified trimester: Secondary | ICD-10-CM

## 2020-09-07 DIAGNOSIS — Z3A00 Weeks of gestation of pregnancy not specified: Secondary | ICD-10-CM | POA: Insufficient documentation

## 2020-09-07 NOTE — Progress Notes (Addendum)
In-person interpreter services provided by Yong Channel from [place] [facility] [employer].  Patient was seen on 09/07/20 for follow-up assessment and education for Pre-existing Type 2 Diabetes in pregnancy. EDD 11/06/20; [redacted]w[redacted]d.   Current medications: Lantus, metformin 1000 bid    Patient doesn't always check after dinner, sometimes because she goes to sleep soon after dinner. Pt states she sometimes has reflux at night and will get up and drink milk (2/24 1:30 am ~4 oz 2% milk)  24-Hr Dietary Recall (patinet did not take pictures of food)  First Meal: 1 c fava beans, falafel, pita bread (133 mg/dL) Snack: 2 strawberries (is very hungry but tries not to eat) Second Meal: roasted chicken, salad (140 mg/dL) Snack:  Third Meal: tuna & chicken, 1/2 pita bread Snack:  Beverages: water, milk at night  The following learning objectives reviewed during follow-up visit:   Factors that can affect FBS: timing of dinner, exercise  Difficulty managing FBS with medication when the readings are in a wide range.  Action of long acting insulin vs short acting insulin  Balanced meals and foods that contain carbohydrates  Plan: (start with making changes that may help bring down the FBS that are above target) . Eat no more than 60 grams of carbs in a meal and include protein . Eat dinner at least 2 hours before bed . Return for additional discussion regarding meals and estimating carbs  Patient instructed to monitor glucose levels: FBS: 60 - 95 mg/dl 2 hour: <120 mg/dl  Patient received the following handouts:  none  Patient will be seen for follow-up as soon as possible.

## 2020-09-11 ENCOUNTER — Other Ambulatory Visit: Payer: Self-pay

## 2020-09-12 ENCOUNTER — Ambulatory Visit: Payer: Medicaid Other | Admitting: *Deleted

## 2020-09-12 ENCOUNTER — Other Ambulatory Visit: Payer: Self-pay | Admitting: *Deleted

## 2020-09-12 ENCOUNTER — Other Ambulatory Visit: Payer: Self-pay

## 2020-09-12 ENCOUNTER — Ambulatory Visit: Payer: Medicaid Other | Attending: Obstetrics and Gynecology

## 2020-09-12 ENCOUNTER — Encounter: Payer: Self-pay | Admitting: *Deleted

## 2020-09-12 VITALS — BP 136/76 | HR 103

## 2020-09-12 DIAGNOSIS — O24414 Gestational diabetes mellitus in pregnancy, insulin controlled: Secondary | ICD-10-CM | POA: Diagnosis not present

## 2020-09-12 DIAGNOSIS — O3413 Maternal care for benign tumor of corpus uteri, third trimester: Secondary | ICD-10-CM

## 2020-09-12 DIAGNOSIS — O24113 Pre-existing diabetes mellitus, type 2, in pregnancy, third trimester: Secondary | ICD-10-CM | POA: Diagnosis not present

## 2020-09-12 DIAGNOSIS — O3660X0 Maternal care for excessive fetal growth, unspecified trimester, not applicable or unspecified: Secondary | ICD-10-CM

## 2020-09-12 DIAGNOSIS — O09513 Supervision of elderly primigravida, third trimester: Secondary | ICD-10-CM | POA: Diagnosis present

## 2020-09-12 DIAGNOSIS — Z794 Long term (current) use of insulin: Secondary | ICD-10-CM | POA: Diagnosis not present

## 2020-09-12 DIAGNOSIS — O09813 Supervision of pregnancy resulting from assisted reproductive technology, third trimester: Secondary | ICD-10-CM

## 2020-09-12 DIAGNOSIS — O288 Other abnormal findings on antenatal screening of mother: Secondary | ICD-10-CM | POA: Diagnosis not present

## 2020-09-12 DIAGNOSIS — D259 Leiomyoma of uterus, unspecified: Secondary | ICD-10-CM

## 2020-09-12 DIAGNOSIS — Z3A32 32 weeks gestation of pregnancy: Secondary | ICD-10-CM

## 2020-09-14 ENCOUNTER — Telehealth: Payer: Self-pay | Admitting: Nurse Practitioner

## 2020-09-14 ENCOUNTER — Encounter: Payer: Medicaid Other | Admitting: Internal Medicine

## 2020-09-14 NOTE — Telephone Encounter (Signed)
Error

## 2020-09-19 ENCOUNTER — Ambulatory Visit: Payer: Medicaid Other | Admitting: *Deleted

## 2020-09-19 ENCOUNTER — Other Ambulatory Visit: Payer: Self-pay

## 2020-09-19 ENCOUNTER — Ambulatory Visit: Payer: Medicaid Other | Admitting: Registered"

## 2020-09-19 ENCOUNTER — Ambulatory Visit: Payer: Medicaid Other | Attending: Obstetrics and Gynecology

## 2020-09-19 ENCOUNTER — Encounter: Payer: Medicaid Other | Attending: Internal Medicine | Admitting: Registered"

## 2020-09-19 ENCOUNTER — Encounter: Payer: Self-pay | Admitting: *Deleted

## 2020-09-19 VITALS — BP 122/82 | HR 103

## 2020-09-19 VITALS — Wt 163.0 lb

## 2020-09-19 DIAGNOSIS — Z794 Long term (current) use of insulin: Secondary | ICD-10-CM | POA: Diagnosis not present

## 2020-09-19 DIAGNOSIS — O24414 Gestational diabetes mellitus in pregnancy, insulin controlled: Secondary | ICD-10-CM | POA: Insufficient documentation

## 2020-09-19 DIAGNOSIS — O289 Unspecified abnormal findings on antenatal screening of mother: Secondary | ICD-10-CM

## 2020-09-19 DIAGNOSIS — O3413 Maternal care for benign tumor of corpus uteri, third trimester: Secondary | ICD-10-CM

## 2020-09-19 DIAGNOSIS — Z3A33 33 weeks gestation of pregnancy: Secondary | ICD-10-CM

## 2020-09-19 DIAGNOSIS — O3663X0 Maternal care for excessive fetal growth, third trimester, not applicable or unspecified: Secondary | ICD-10-CM

## 2020-09-19 DIAGNOSIS — Z3A00 Weeks of gestation of pregnancy not specified: Secondary | ICD-10-CM | POA: Insufficient documentation

## 2020-09-19 DIAGNOSIS — O09523 Supervision of elderly multigravida, third trimester: Secondary | ICD-10-CM | POA: Diagnosis present

## 2020-09-19 DIAGNOSIS — O24113 Pre-existing diabetes mellitus, type 2, in pregnancy, third trimester: Secondary | ICD-10-CM | POA: Diagnosis not present

## 2020-09-19 DIAGNOSIS — O24112 Pre-existing diabetes mellitus, type 2, in pregnancy, second trimester: Secondary | ICD-10-CM | POA: Insufficient documentation

## 2020-09-19 DIAGNOSIS — O09813 Supervision of pregnancy resulting from assisted reproductive technology, third trimester: Secondary | ICD-10-CM

## 2020-09-19 DIAGNOSIS — D259 Leiomyoma of uterus, unspecified: Secondary | ICD-10-CM

## 2020-09-19 DIAGNOSIS — O09513 Supervision of elderly primigravida, third trimester: Secondary | ICD-10-CM

## 2020-09-19 NOTE — Progress Notes (Signed)
Interpreter services provided by [employer] from [employer].  Patient was seen on 09/21/20 for follow-up assessment and education for Gestational Diabetes. EDD 11/06/20; [redacted]w[redacted]d.  Wt Readings from Last 3 Encounters:  09/19/20 163 lb (73.9 kg)  08/31/20 164 lb 6.4 oz (74.6 kg)  08/17/20 162 lb 9.6 oz (73.8 kg)   Medications: Lantus 36 units at bedtime, Metformin 1000 bid  Patient is testing 2x/day; FBS & 2 hours after breakfast. Pt states Dr. Juleen China said checking 2x/day is okay. Pt reports since 2009 diagnosis of Type 2 Diabetes she has been injecting insulin and checking blood sugar 2x/day. Pt states it is too much for her to check more that than. Patient could not explain more than that as to why she cannot check 4x/day. Patient is likely experiencing diabetes management defeated/burnout.  Patient states she has been eating very little to keep her blood sugar down and is very hungry and cannot keep limiting her diet. Weight taken today to check for weight loss (above) Patient had a difficult time giving a dietary recall partially due to cultural difference in food and hard to explain. Bread that is eaten with meals is very thin so may not be that much carbohydrate. Pt states she is interested in taking meal-time insulin to be able to eat more food.  Dietary recall:  Morning:  milk 10-11 a.m. eggs, salad 12:30: nuts, 3/4 pita 8:30 stops eating  Patinet had a difficult time providing diet information abov. RD had to keep asking different ways to get this much information.    The following learning objectives reviewed during follow-up visit:   Reason for checking blood sugar to allow for proper medication adjustment  Plan:  . Take pictures of your food . Check blood sugar 4x day and take log to all appointments . Talk to MD about adding meal time insulin so you can eat more food without having too high of blood sugar.  Patient instructed to monitor glucose levels: FBS: 70 -  95 mg/dl 2 hour: <120 mg/dl  Patient received the following handouts:  none  Patient will be seen for follow-up in 2 weeks or as needed.

## 2020-09-21 ENCOUNTER — Telehealth: Payer: Self-pay | Admitting: Nurse Practitioner

## 2020-09-21 NOTE — Telephone Encounter (Signed)
1) Medication(s) Requested (by name): METFORMIN  2) Pharmacy of Choice: [facility]  3) Special Requests:  PCP Phill Myron per pt   Approved medications will be sent to the pharmacy, we will reach out if there is an issue.  Requests made after 3pm may not be addressed until the following business day!  If a patient is unsure of the name of the medication(s) please note and ask patient to call back when they are able to provide all info, do not send to responsible party until all information is available!

## 2020-09-21 NOTE — Telephone Encounter (Signed)
PCP [employer]. Route to PCP for refill request.

## 2020-09-21 NOTE — Telephone Encounter (Signed)
Hi , she has 1 year of refills.  Please have her just contact her pharmacy

## 2020-09-25 ENCOUNTER — Other Ambulatory Visit: Payer: Self-pay | Admitting: Internal Medicine

## 2020-09-25 ENCOUNTER — Other Ambulatory Visit: Payer: Self-pay

## 2020-09-25 ENCOUNTER — Telehealth: Payer: Self-pay | Admitting: Internal Medicine

## 2020-09-25 DIAGNOSIS — O24112 Pre-existing diabetes mellitus, type 2, in pregnancy, second trimester: Secondary | ICD-10-CM

## 2020-09-25 DIAGNOSIS — O24113 Pre-existing diabetes mellitus, type 2, in pregnancy, third trimester: Secondary | ICD-10-CM

## 2020-09-25 MED ORDER — LANTUS SOLOSTAR 100 UNIT/ML ~~LOC~~ SOPN: 36.0000 [IU] | PEN_INJECTOR | Freq: Every day | SUBCUTANEOUS | 11 refills | Status: DC

## 2020-09-25 MED ORDER — METFORMIN HCL 1000 MG PO TABS: 1000.0000 mg | ORAL_TABLET | Freq: Two times a day (BID) | ORAL | 3 refills | Status: DC

## 2020-09-25 MED FILL — !LANTUS SOLOSTAR 100UNITS/M: 100 | 32 days supply | Qty: 12 | Fill #0

## 2020-09-25 NOTE — Telephone Encounter (Signed)
Refills sent.   Phill Myron, D.O. Primary Care at [facility]  09/25/2020, 2:31 PM

## 2020-09-25 NOTE — Telephone Encounter (Signed)
1) Medication(s) Requested (by name): metformin & insulin  2) Pharmacy of Choice: [facility]  3) Special Requests:  Pt asking Dr Phill Myron   Approved medications will be sent to the pharmacy, we will reach out if there is an issue.  Requests made after 3pm may not be addressed until the following business day!  If a patient is unsure of the name of the medication(s) please note and ask patient to call back when they are able to provide all info, do not send to responsible party until all information is available!

## 2020-09-27 ENCOUNTER — Ambulatory Visit: Payer: Medicaid Other | Admitting: *Deleted

## 2020-09-27 ENCOUNTER — Other Ambulatory Visit: Payer: Self-pay | Admitting: Maternal & Fetal Medicine

## 2020-09-27 ENCOUNTER — Encounter: Payer: Self-pay | Admitting: *Deleted

## 2020-09-27 ENCOUNTER — Ambulatory Visit: Payer: Medicaid Other | Attending: Maternal & Fetal Medicine

## 2020-09-27 ENCOUNTER — Other Ambulatory Visit: Payer: Self-pay

## 2020-09-27 ENCOUNTER — Other Ambulatory Visit: Payer: Self-pay | Admitting: *Deleted

## 2020-09-27 VITALS — BP 129/77 | HR 97

## 2020-09-27 DIAGNOSIS — O288 Other abnormal findings on antenatal screening of mother: Secondary | ICD-10-CM

## 2020-09-27 DIAGNOSIS — O09513 Supervision of elderly primigravida, third trimester: Secondary | ICD-10-CM

## 2020-09-27 DIAGNOSIS — O24113 Pre-existing diabetes mellitus, type 2, in pregnancy, third trimester: Secondary | ICD-10-CM | POA: Diagnosis not present

## 2020-09-27 DIAGNOSIS — O3660X0 Maternal care for excessive fetal growth, unspecified trimester, not applicable or unspecified: Secondary | ICD-10-CM

## 2020-09-27 DIAGNOSIS — Z7984 Long term (current) use of oral hypoglycemic drugs: Secondary | ICD-10-CM

## 2020-09-27 DIAGNOSIS — Z794 Long term (current) use of insulin: Secondary | ICD-10-CM

## 2020-09-27 DIAGNOSIS — R772 Abnormality of alphafetoprotein: Secondary | ICD-10-CM

## 2020-09-27 DIAGNOSIS — D259 Leiomyoma of uterus, unspecified: Secondary | ICD-10-CM

## 2020-09-27 DIAGNOSIS — Z3A34 34 weeks gestation of pregnancy: Secondary | ICD-10-CM

## 2020-09-27 DIAGNOSIS — O3413 Maternal care for benign tumor of corpus uteri, third trimester: Secondary | ICD-10-CM

## 2020-09-27 DIAGNOSIS — O09813 Supervision of pregnancy resulting from assisted reproductive technology, third trimester: Secondary | ICD-10-CM

## 2020-10-04 ENCOUNTER — Ambulatory Visit: Payer: Medicaid Other | Admitting: *Deleted

## 2020-10-04 ENCOUNTER — Other Ambulatory Visit: Payer: Self-pay

## 2020-10-04 ENCOUNTER — Ambulatory Visit: Payer: Medicaid Other | Attending: Obstetrics and Gynecology

## 2020-10-04 ENCOUNTER — Encounter: Payer: Self-pay | Admitting: *Deleted

## 2020-10-04 VITALS — BP 133/82 | HR 82

## 2020-10-04 DIAGNOSIS — O3413 Maternal care for benign tumor of corpus uteri, third trimester: Secondary | ICD-10-CM | POA: Diagnosis not present

## 2020-10-04 DIAGNOSIS — O09513 Supervision of elderly primigravida, third trimester: Secondary | ICD-10-CM

## 2020-10-04 DIAGNOSIS — Z3A35 35 weeks gestation of pregnancy: Secondary | ICD-10-CM

## 2020-10-04 DIAGNOSIS — O281 Abnormal biochemical finding on antenatal screening of mother: Secondary | ICD-10-CM | POA: Diagnosis not present

## 2020-10-04 DIAGNOSIS — O24113 Pre-existing diabetes mellitus, type 2, in pregnancy, third trimester: Secondary | ICD-10-CM

## 2020-10-04 DIAGNOSIS — D259 Leiomyoma of uterus, unspecified: Secondary | ICD-10-CM

## 2020-10-04 DIAGNOSIS — E119 Type 2 diabetes mellitus without complications: Secondary | ICD-10-CM

## 2020-10-04 DIAGNOSIS — O3663X0 Maternal care for excessive fetal growth, third trimester, not applicable or unspecified: Secondary | ICD-10-CM

## 2020-10-04 DIAGNOSIS — O09813 Supervision of pregnancy resulting from assisted reproductive technology, third trimester: Secondary | ICD-10-CM

## 2020-10-04 DIAGNOSIS — Z794 Long term (current) use of insulin: Secondary | ICD-10-CM

## 2020-10-05 ENCOUNTER — Other Ambulatory Visit: Payer: Self-pay | Admitting: Obstetrics and Gynecology

## 2020-10-05 ENCOUNTER — Encounter: Payer: Self-pay | Admitting: Obstetrics and Gynecology

## 2020-10-05 ENCOUNTER — Encounter: Payer: Medicaid Other | Admitting: Obstetrics and Gynecology

## 2020-10-05 ENCOUNTER — Ambulatory Visit: Payer: Medicaid Other | Admitting: Registered"

## 2020-10-05 ENCOUNTER — Ambulatory Visit (INDEPENDENT_AMBULATORY_CARE_PROVIDER_SITE_OTHER): Payer: Medicaid Other | Admitting: Obstetrics and Gynecology

## 2020-10-05 ENCOUNTER — Encounter: Payer: Medicaid Other | Admitting: Internal Medicine

## 2020-10-05 ENCOUNTER — Encounter: Payer: Medicaid Other | Admitting: Registered"

## 2020-10-05 VITALS — BP 131/85 | HR 84 | Wt 171.5 lb

## 2020-10-05 DIAGNOSIS — O280 Abnormal hematological finding on antenatal screening of mother: Secondary | ICD-10-CM | POA: Diagnosis not present

## 2020-10-05 DIAGNOSIS — O24112 Pre-existing diabetes mellitus, type 2, in pregnancy, second trimester: Secondary | ICD-10-CM | POA: Diagnosis not present

## 2020-10-05 DIAGNOSIS — O09513 Supervision of elderly primigravida, third trimester: Secondary | ICD-10-CM | POA: Diagnosis not present

## 2020-10-05 DIAGNOSIS — O0993 Supervision of high risk pregnancy, unspecified, third trimester: Secondary | ICD-10-CM

## 2020-10-05 DIAGNOSIS — O24119 Pre-existing diabetes mellitus, type 2, in pregnancy, unspecified trimester: Secondary | ICD-10-CM

## 2020-10-05 DIAGNOSIS — O24113 Pre-existing diabetes mellitus, type 2, in pregnancy, third trimester: Secondary | ICD-10-CM

## 2020-10-05 DIAGNOSIS — O3663X0 Maternal care for excessive fetal growth, third trimester, not applicable or unspecified: Secondary | ICD-10-CM

## 2020-10-05 MED ORDER — ACCU-CHEK GUIDE W/DEVICE KIT: 1.0000 | PACK | Freq: Once | 0 refills | Status: AC

## 2020-10-05 MED ORDER — ACCU-CHEK SOFTCLIX LANCETS MISC: 6 refills | Status: DC

## 2020-10-05 MED ORDER — ACCU-CHEK GUIDE VI STRP: ORAL_STRIP | 6 refills | Status: DC

## 2020-10-05 MED FILL — ACCU-CHEK GUIDE TEST STRIP: 25 days supply | Qty: 100 | Fill #0

## 2020-10-05 MED FILL — ACCU-CHEK GUIDE ME W/DEVICE: W/DEVICE | 1 days supply | Qty: 1 | Fill #0

## 2020-10-05 MED FILL — ACCU-CHEK SOFTCLIX LANCETS: 25 days supply | Qty: 100 | Fill #0

## 2020-10-05 MED FILL — PRENATAL 27-1 MG TABS: 27-1 | 30 days supply | Qty: 30 | Fill #1

## 2020-10-05 NOTE — Progress Notes (Signed)
Interpreter services provided by Yong Channel from [facility]  Patient was seen on 10/05/20 for follow-up assessment and education for Type 2 Diabetes in pregnancy. EDD 11/05/20; [redacted]w[redacted]d.   Patient is  testing blood glucose as directed pre breakfast and 2 hours after each meal.    The following learning objectives reviewed during follow-up visit:   Action of metformin and Lantus  Changes to insulin that can be expected after delivery of baby  Small adjustment to lunch meals needed to bring CBGs into normal range  Plan:  . Continue to take medication as directed . Consider eating a little less bread at lunchtime   Patient instructed to monitor glucose levels: FBS: 70 - 95 mg/dl 2 hour: <120 mg/dl  Patient received the following handouts: None  Patient will be seen for follow-up as needed.

## 2020-10-05 NOTE — Progress Notes (Signed)
Subjective:  Tina Brown is a 35 y.o. G1P0 at [redacted]w[redacted]d being seen today for ongoing prenatal care. Transferred from FM d/t to Type 2 DM. She is currently monitored for the following issues for this high-risk pregnancy and has Diabetes mellitus with complication ([place]); High-risk pregnancy supervision; Diabetes in pregnancy; Advanced maternal age, 1st pregnancy; Pregnancy resulting from in vitro fertilization, second trimester; Intramural leiomyoma of uterus; Abnormal antenatal AFP screen; and Excessive fetal growth affecting management of mother in third trimester, antepartum on their problem list.  Patient reports no complaints.  Contractions: Irritability. Vag. Bleeding: None.  Movement: Present. Denies leaking of fluid.   The following portions of the patient's history were reviewed and updated as appropriate: allergies, current medications, past family history, past medical history, past social history, past surgical history and problem list. Problem list updated.  Objective:   Vitals:   10/05/20 1104  BP: 131/85  Pulse: 84  Weight: 171 lb 8 oz (77.8 kg)    Fetal Status: Fetal Heart Rate (bpm): 152   Movement: Present     General:  Alert, oriented and cooperative. Patient is in no acute distress.  Skin: Skin is warm and dry. No rash noted.   Cardiovascular: Normal heart rate noted  Respiratory: Normal respiratory effort, no problems with respiration noted  Abdomen: Soft, gravid, appropriate for gestational age. Pain/Pressure: Present     Pelvic:  Cervical exam deferred        Extremities: Normal range of motion.  Edema: None  Mental Status: Normal mood and affect. Normal behavior. Normal judgment and thought content.   Urinalysis:      Assessment and Plan:  Pregnancy: G1P0 at [redacted]w[redacted]d  1. Supervision of high risk pregnancy in third trimester Stable GBS next visit  2. Primigravida of advanced maternal age in third trimester   3. Abnormal antenatal AFP screen Elevated  MSAFP Growth 88 % on 09/27/20 F/U scheduled  4. Pre-existing type 2 diabetes mellitus during pregnancy in third trimester CBG's in goal range, paper log Continue with Lantus and Metformin Weekly antenatal testing with MFM  IOL at 39 weeks unless otherwise indicated, schedule at next visit  5. Excessive fetal growth affecting management of pregnancy in third trimester, single or unspecified fetus Growth scan as noted above  Preterm labor symptoms and general obstetric precautions including but not limited to vaginal bleeding, contractions, leaking of fluid and fetal movement were reviewed in detail with the patient. Please refer to After Visit Summary for other counseling recommendations.  Return in about 1 week (around 10/12/2020) for OB visit, face to face, MD only.   Chancy Milroy, MD

## 2020-10-05 NOTE — Progress Notes (Signed)
Pt has Paper log for glucose.

## 2020-10-09 ENCOUNTER — Telehealth: Payer: Self-pay | Admitting: *Deleted

## 2020-10-09 ENCOUNTER — Encounter (HOSPITAL_COMMUNITY): Payer: Self-pay | Admitting: Family Medicine

## 2020-10-09 ENCOUNTER — Other Ambulatory Visit: Payer: Self-pay

## 2020-10-09 ENCOUNTER — Inpatient Hospital Stay (HOSPITAL_COMMUNITY)
Admission: AD | Admit: 2020-10-09 | Discharge: 2020-10-09 | Disposition: A | Discharge status: Home / Self Care | Payer: Medicaid Other | Attending: Family Medicine | Admitting: Family Medicine

## 2020-10-09 DIAGNOSIS — Z3A36 36 weeks gestation of pregnancy: Secondary | ICD-10-CM | POA: Diagnosis not present

## 2020-10-09 DIAGNOSIS — R030 Elevated blood-pressure reading, without diagnosis of hypertension: Secondary | ICD-10-CM | POA: Insufficient documentation

## 2020-10-09 DIAGNOSIS — O09513 Supervision of elderly primigravida, third trimester: Secondary | ICD-10-CM | POA: Diagnosis not present

## 2020-10-09 DIAGNOSIS — Z0371 Encounter for suspected problem with amniotic cavity and membrane ruled out: Secondary | ICD-10-CM

## 2020-10-09 DIAGNOSIS — O24113 Pre-existing diabetes mellitus, type 2, in pregnancy, third trimester: Secondary | ICD-10-CM | POA: Diagnosis not present

## 2020-10-09 DIAGNOSIS — N898 Other specified noninflammatory disorders of vagina: Secondary | ICD-10-CM | POA: Diagnosis not present

## 2020-10-09 DIAGNOSIS — Z3689 Encounter for other specified antenatal screening: Secondary | ICD-10-CM

## 2020-10-09 DIAGNOSIS — O99891 Other specified diseases and conditions complicating pregnancy: Secondary | ICD-10-CM

## 2020-10-09 DIAGNOSIS — O42913 Preterm premature rupture of membranes, unspecified as to length of time between rupture and onset of labor, third trimester: Secondary | ICD-10-CM | POA: Diagnosis present

## 2020-10-09 DIAGNOSIS — O26893 Other specified pregnancy related conditions, third trimester: Secondary | ICD-10-CM | POA: Diagnosis not present

## 2020-10-09 LAB — WET PREP, GENITAL
Clue Cells Wet Prep HPF POC: NONE SEEN
Sperm: NONE SEEN
Trich, Wet Prep: NONE SEEN
Yeast Wet Prep HPF POC: NONE SEEN

## 2020-10-09 LAB — CBC
HCT: 37 % (ref 36.0–46.0)
Hemoglobin: 12.6 g/dL (ref 12.0–15.0)
MCH: 29.6 pg (ref 26.0–34.0)
MCHC: 34.1 g/dL (ref 30.0–36.0)
MCV: 86.9 fL (ref 80.0–100.0)
Platelets: 202 10*3/uL (ref 150–400)
RBC: 4.26 MIL/uL (ref 3.87–5.11)
RDW: 14.5 % (ref 11.5–15.5)
WBC: 5.7 10*3/uL (ref 4.0–10.5)
nRBC: 0 % (ref 0.0–0.2)

## 2020-10-09 LAB — COMPREHENSIVE METABOLIC PANEL
ALT: 26 U/L (ref 0–44)
AST: 29 U/L (ref 15–41)
Albumin: 2.7 g/dL — ABNORMAL LOW (ref 3.5–5.0)
Alkaline Phosphatase: 121 U/L (ref 38–126)
Anion gap: 9 (ref 5–15)
BUN: 13 mg/dL (ref 6–20)
CO2: 20 mmol/L — ABNORMAL LOW (ref 22–32)
Calcium: 9.6 mg/dL (ref 8.9–10.3)
Chloride: 109 mmol/L (ref 98–111)
Creatinine, Ser: 0.58 mg/dL (ref 0.44–1.00)
GFR, Estimated: >60 mL/min (ref >60)
Glucose, Bld: 82 mg/dL (ref 70–99)
Potassium: 4.2 mmol/L (ref 3.5–5.1)
Sodium: 138 mmol/L (ref 135–145)
Total Bilirubin: 0.3 mg/dL (ref 0.3–1.2)
Total Protein: 6.1 g/dL — ABNORMAL LOW (ref 6.5–8.1)

## 2020-10-09 LAB — PROTEIN / CREATININE RATIO, URINE
Creatinine, Urine: 146.15 mg/dL
Protein Creatinine Ratio: 0.16 mg/mg{Cre} — ABNORMAL HIGH (ref 0.00–0.15)
Total Protein, Urine: 23 mg/dL

## 2020-10-09 LAB — AMNISURE RUPTURE OF MEMBRANE (ROM) NOT AT ARMC: Amnisure ROM: NEGATIVE

## 2020-10-09 NOTE — MAU Note (Signed)
.  Tina Brown is a 35 y.o. at [redacted]w[redacted]d here in MAU reporting: possible SROM at 0200 this morning. She states that she felt a gush of fluid and it has continued to leak out. Denies VB. Endorses good fetal movement.

## 2020-10-09 NOTE — MAU Provider Note (Signed)
History     CSN: [id number]  Arrival date and time: 10/09/20 1218   Event Date/Time   First Provider Initiated Contact with Patient 10/09/20 1300      Chief Complaint  Patient presents with  . Rupture of Membranes   Ms. Tina Brown is a 35 y.o. G1P0 at [redacted]w[redacted]d who presents to MAU for preeclampsia evaluation. Patient initially presented to MAU with concerns for PPROM. Patient reports early this morning she experienced a large gush of clear, watery fluid that continued to come in smaller amounts throughout the morning. Patient has not had any additional episodes of leaking since coming to MAU.  Incidentally, the patient was found to have new onset elevated pressures at MAU today. Patient has been normotensive for her entire pregnancy up until today.  Pt denies HA, blurry vision/seeing spots, N/V, epigastric pain, swelling in face and hands, sudden weight gain. Pt denies chest pain and SOB.  Pt denies constipation, diarrhea, or urinary problems. Pt denies fever, chills, fatigue, sweating or changes in appetite. Pt denies dizziness, light-headedness, weakness.  Pt denies VB, ctx, and reports good FM.  Current pregnancy problems? Type II DM, IVF pregnancy, fibroids, abnormal AFP Blood Type? O Positive Allergies? NKDA Current PNC & next appt? MFM, 10/11/2020  Arabic interpreter used for entire visit.   OB History    Gravida  1   Para      Term      Preterm      AB      Living        SAB      IAB      Ectopic      Multiple      Live Births              Past Medical History:  Diagnosis Date  . Diabetes mellitus without complication ([place])   . Foot callus 08/2019  . In vitro fertilization     Past Surgical History:  Procedure Laterality Date  . IVF      Family History  Problem Relation Age of Onset  . Diabetes Mother   . Hypertension Mother   . Diabetes Father   . Hypertension Father   . Diabetes Maternal Grandmother   . Hypertension Maternal  Grandmother   . Diabetes Maternal Grandfather   . Hypertension Maternal Grandfather   . Diabetes Paternal Grandmother   . Hypertension Paternal Grandmother   . Diabetes Paternal Grandfather   . Hypertension Paternal Grandfather     Social History   Tobacco Use  . Smoking status: Never Smoker  . Smokeless tobacco: Never Used  Vaping Use  . Vaping Use: Never used  Substance Use Topics  . Alcohol use: No  . Drug use: No    Allergies: No Known Allergies  No medications prior to admission.    Review of Systems  Constitutional: Negative for chills, diaphoresis, fatigue and fever.  Eyes: Negative for visual disturbance.  Respiratory: Negative for shortness of breath.   Cardiovascular: Negative for chest pain.  Gastrointestinal: Negative for abdominal pain, constipation, diarrhea, nausea and vomiting.  Genitourinary: Positive for vaginal discharge. Negative for dysuria, flank pain, frequency, pelvic pain, urgency and vaginal bleeding.  Neurological: Negative for dizziness, weakness, light-headedness and headaches.   Physical Exam   Blood pressure (!) 151/87, pulse 89, temperature 98.2 F (36.8 C), temperature source Oral, resp. rate 16, height 5\' 1"  (1.549 m), weight 80.4 kg, last menstrual period 01/31/2020, SpO2 100 %.  Patient Vitals for the past  24 hrs:  BP Temp Temp src Pulse Resp SpO2 Height Weight  10/09/20 1616 (!) 151/87 -- -- 89 16 100 % -- --  10/09/20 1601 137/72 -- -- 85 -- -- -- --  10/09/20 1546 (!) 142/75 -- -- 87 -- 100 % -- --  10/09/20 1531 140/75 -- -- 85 -- 100 % -- --  10/09/20 1516 (!) 160/78 -- -- 89 -- 100 % -- --  10/09/20 1501 (!) 141/80 -- -- 80 -- 100 % -- --  10/09/20 1446 (!) 151/88 -- -- 82 -- 100 % -- --  10/09/20 1431 129/67 -- -- 85 -- 100 % -- --  10/09/20 1416 127/70 -- -- 87 -- 100 % -- --  10/09/20 1401 123/60 -- -- 80 -- 100 % -- --  10/09/20 1346 119/78 -- -- 81 -- 100 % -- --  10/09/20 1332 128/66 -- -- 82 -- -- -- --  10/09/20  1330 135/70 -- -- 81 -- 100 % -- --  10/09/20 1316 (!) 146/81 -- -- 82 -- -- -- --  10/09/20 1301 (!) 149/93 -- -- 93 -- 100 % -- --  10/09/20 1246 (!) 154/82 -- -- 89 -- 100 % -- --  10/09/20 1243 (!) 151/84 -- -- 85 -- -- -- --  10/09/20 1235 (!) 154/97 98.2 F (36.8 C) Oral 91 17 100 % 5\' 1"  (1.549 m) 80.4 kg   Physical Exam Vitals and nursing note reviewed.  Constitutional:      Appearance: Normal appearance.  HENT:     Head: Normocephalic and atraumatic.  Pulmonary:     Effort: Pulmonary effort is normal.  Neurological:     Mental Status: She is alert and oriented to person, place, and time.  Psychiatric:        Mood and Affect: Mood normal.        Behavior: Behavior normal.        Thought Content: Thought content normal.        Judgment: Judgment normal.    Results for orders placed or performed during the hospital encounter of 10/09/20 (from the past 24 hour(s))  Amnisure rupture of membrane (rom)not at [facility]     Status: None   Collection Time: 10/09/20  1:14 PM  Result Value Ref Range   Amnisure ROM NEGATIVE   Wet prep, genital     Status: Abnormal   Collection Time: 10/09/20  1:14 PM   Specimen: Vaginal  Result Value Ref Range   Yeast Wet Prep HPF POC NONE SEEN NONE SEEN   Trich, Wet Prep NONE SEEN NONE SEEN   Clue Cells Wet Prep HPF POC NONE SEEN NONE SEEN   WBC, Wet Prep HPF POC MODERATE (A) NONE SEEN   Sperm NONE SEEN   Protein / creatinine ratio, urine     Status: Abnormal   Collection Time: 10/09/20  1:30 PM  Result Value Ref Range   Creatinine, Urine 146.15 mg/dL   Total Protein, Urine 23 mg/dL   Protein Creatinine Ratio 0.16 (H) 0.00 - 0.15 mg/mg[Cre]  CBC     Status: None   Collection Time: 10/09/20  1:42 PM  Result Value Ref Range   WBC 5.7 4.0 - 10.5 K/uL   RBC 4.26 3.87 - 5.11 MIL/uL   Hemoglobin 12.6 12.0 - 15.0 g/dL   HCT 37.0 36.0 - 46.0 %   MCV 86.9 80.0 - 100.0 fL   MCH 29.6 26.0 - 34.0 pg   MCHC 34.1 30.0 - 36.0  g/dL   RDW 14.5 11.5 -  15.5 %   Platelets 202 150 - 400 K/uL   nRBC 0.0 0.0 - 0.2 %  Comprehensive metabolic panel     Status: Abnormal   Collection Time: 10/09/20  1:42 PM  Result Value Ref Range   Sodium 138 135 - 145 mmol/L   Potassium 4.2 3.5 - 5.1 mmol/L   Chloride 109 98 - 111 mmol/L   CO2 20 (L) 22 - 32 mmol/L   Glucose, Bld 82 70 - 99 mg/dL   BUN 13 6 - 20 mg/dL   Creatinine, Ser 0.58 0.44 - 1.00 mg/dL   Calcium 9.6 8.9 - 10.3 mg/dL   Total Protein 6.1 (L) 6.5 - 8.1 g/dL   Albumin 2.7 (L) 3.5 - 5.0 g/dL   AST 29 15 - 41 U/L   ALT 26 0 - 44 U/L   Alkaline Phosphatase 121 38 - 126 U/L   Total Bilirubin 0.3 0.3 - 1.2 mg/dL   GFR, Estimated >60 >60 mL/min   Anion gap 9 5 - 15   [place] MFM FETAL BPP WO NON STRESS  Result Date: 10/04/2020 ----------------------------------------------------------------------  OBSTETRICS REPORT                       (Signed Final 10/04/2020 02:20 pm) ---------------------------------------------------------------------- Patient Info  ID #:       [id number]                          D.O.B.:  09-08-1985 (35 yrs)  Name:       [name]                    Visit Date: 10/04/2020 02:08 pm ---------------------------------------------------------------------- Performed By  Attending:        Johnell Comings MD         Ref. Address:     [place]  Performed By:     Polo Riley        [facility]                                                             [place] [facility]  Referred By:      [facility] ---------------------------------------------------------------------- Orders  #  Description                           Code        Ordered By  1  [place] MFM FETAL BPP WO NON               76819.01    RAVI [facility]     STRESS  ----------------------------------------------------------------------  #  Order #                     Accession #                Episode #  1  [id number]                   [id number]                 [id number] ---------------------------------------------------------------------- Indications  Abnormal biochemical screen (QUAD - AFP        O28.9  MoM 2.75)  Pre-existing diabetes, type 2, in pregnancy,   O24.113  third trimester (metformin and Insulin)  Pregnancy resulting from assisted              O36.819  reproductive technology  Advanced maternal age primigravida 66+,        O77.513  third trimester(35 yrs)  Uterine fibroids                               O34.10  Large for gestational age fetus affecting      O79.60X0  management of mother  Fetal ECHO Normal  [redacted] weeks gestation of pregnancy                Z3A.35 ---------------------------------------------------------------------- Fetal Evaluation  Num Of Fetuses:         1  Fetal Heart Rate(bpm):  144  Cardiac Activity:       Observed  Presentation:           Cephalic  Placenta:               Anterior  P. Cord Insertion:      Previously Visualized  Amniotic Fluid  AFI FV:      Within normal limits  AFI Sum(cm)     %Tile       Largest Pocket(cm)  12.62           40          5.36  RUQ(cm)       RLQ(cm)       LUQ(cm)        LLQ(cm)  4.21          5.36          3.05           0 ---------------------------------------------------------------------- Biophysical Evaluation  Amniotic F.V:   Within normal limits       F. Tone:        Observed  F. Movement:    Observed                   Score:          8/8  F. Breathing:   Observed ---------------------------------------------------------------------- Biometry  LV:        7.5  mm ---------------------------------------------------------------------- OB History  Gravidity:    1 ---------------------------------------------------------------------- Gestational Age  LMP:           35w 2d        Date:  01/31/20                  EDD:   11/06/20  Best:          Barbie Haggis 2d     Det. By:  LMP  (01/31/20)          EDD:   11/06/20 ---------------------------------------------------------------------- Anatomy  Ventricles:            Appears normal         Stomach:                Appears normal, left                                                                        sided  Heart:                 Appears normal         Kidneys:                Appear normal                         (4CH, axis, and                         situs)  Diaphragm:             Appears normal         Bladder:                Appears normal  Other:  All other anatomy previously seen as normal ---------------------------------------------------------------------- Myomas  Site                     L(cm)      W(cm)      D(cm)       Location  Anterior left lateral    4.4        7          3.4 ----------------------------------------------------------------------  Blood Flow                  RI       PI       Comments ---------------------------------------------------------------------- Comments  This patient was seen for a biophysical profile due to  advanced maternal age, an elevated MSAFP of 2.75 MoM,  and pregestational diabetes that is currently treated with  insulin and Metformin.  She denies any problems since her  last exam.  A biophysical profile performed today was 8 out of 8.  There was normal amniotic fluid noted on today's ultrasound  exam.  She will return in 1 week for another biophysical profile. ----------------------------------------------------------------------                   Johnell Comings, MD Electronically Signed Final Report   10/04/2020 02:20 pm ----------------------------------------------------------------------  [place] MFM FETAL BPP WO NON STRESS  Result Date: 09/27/2020 ----------------------------------------------------------------------  OBSTETRICS REPORT                       (Signed Final 09/27/2020 12:21 pm)  ---------------------------------------------------------------------- Patient Info  ID #:       [id number]                          D.O.B.:  09/17/1985 (35 yrs)  Name:       ZONDRA LAWLOR                    Visit Date: 09/27/2020 11:15 am ---------------------------------------------------------------------- Performed By  Attending:        Sander Nephew      Ref. Address:     [place]  Performed By:     Rodrigo Ran BS      Location[facility]         [facility]                    RDMS RVT                                 Fetal Care at                                                             [place] for                                                             Women  Referred By:      [facility] ---------------------------------------------------------------------- Orders  #  Description                           Code        Ordered By  1  [place] MFM OB FOLLOW UP                   09326.71    Sander Nephew  2  [place] MFM FETAL BPP WO NON  55732.20    [facility]     STRESS                                            BOOKER ----------------------------------------------------------------------  #  Order #                     Accession #                Episode #  1  [id number]                   [id number]                 [id number]  2  [id number]                   [id number]                 [id number] ---------------------------------------------------------------------- Indications  [redacted] weeks gestation of pregnancy                Z3A.34  Abnormal biochemical screen (QUAD - AFP        O28.9  MoM 2.75)  Pre-existing diabetes, type 2, in pregnancy,   O24.113  third trimester (metformin and Insulin)  Pregnancy resulting from assisted              O56.819  reproductive technology  Advanced maternal age primigravida 68+,        O74.513   third trimester(35 yrs)  Uterine fibroids                               O34.10  Large for gestational age fetus affecting      O89.60X0  management of mother  Fetal ECHO Normal ---------------------------------------------------------------------- Fetal Evaluation  Num Of Fetuses:         1  Fetal Heart Rate(bpm):  144  Cardiac Activity:       Observed  Presentation:           Cephalic  Placenta:               Anterior  P. Cord Insertion:      Previously Visualized  Amniotic Fluid  AFI FV:      Within normal limits  AFI Sum(cm)     %Tile       Largest Pocket(cm)  15.4            55          5.9  RUQ(cm)       RLQ(cm)       LUQ(cm)        LLQ(cm)  2.4           4.3           5.9            2.8 ---------------------------------------------------------------------- Biophysical Evaluation  Amniotic F.V:   Within normal limits       F. Tone:        Observed  F. Movement:    Observed                   Score:          8/8  F. Breathing:   Observed ---------------------------------------------------------------------- Biometry  BPD:  92  mm     G. Age:  37w 3d         99  %    CI:           80   %    70 - 86                                                          FL/HC:      19.8   %    19.4 - 21.8  HC:       325   mm     G. Age:  36w 6d         78  %    HC/AC:      0.99        0.96 - 1.11  AC:      327.8  mm     G. Age:  36w 5d         97  %    FL/BPD:     70.1   %    71 - 87  FL:       64.5  mm     G. Age:  33w 2d         17  %    FL/AC:      19.7   %    20 - 24  HUM:      53.7  mm     G. Age:  31w 2d        < 5  %  Est. FW:    2803  gm      6 lb 3 oz     88  % ---------------------------------------------------------------------- OB History  Gravidity:    1 ---------------------------------------------------------------------- Gestational Age  LMP:           34w 2d        Date:  01/31/20                 EDD:   11/06/20  U/S Today:     36w 1d                                        EDD:   10/24/20  Best:           34w 2d     Det. By:  LMP  (01/31/20)          EDD:   11/06/20 ---------------------------------------------------------------------- Anatomy  Cranium:               Appears normal         LVOT:                   Appears normal  Cavum:                 Previously seen        Aortic Arch:            Previously seen  Ventricles:            Previously seen        Ductal Arch:            Previously seen  Choroid  Plexus:        Appears normal         Diaphragm:              Appears normal  Cerebellum:            Previously seen        Stomach:                Appears normal, left                                                                        sided  Posterior Fossa:       Previously seen        Abdomen:                Appears normal  Nuchal Fold:           Previously seen        Abdominal Wall:         Previously seen  Face:                  Appears normal         Cord Vessels:           Previously seen                         (orbits and profile)  Lips:                  Previously seen        Kidneys:                Appear normal  Palate:                Previously seen        Bladder:                Appears normal  Thoracic:              Appears normal         Spine:                  Limited views                                                                        previously seen  Heart:                 Appears normal         Upper Extremities:      Previously seen                         (4CH, axis, and                         situs)  RVOT:  Appears normal         Lower Extremities:      Previously seen  Other:  Female gender previously seen. Heels previously visualized. Lenses          visualized. Nasal bone visualized. Technically difficult due to fetal          position. ---------------------------------------------------------------------- Cervix Uterus Adnexa  Cervix  Not visualized (advanced GA >24wks)  Uterus  Single fibroid noted, see table below.  Right Ovary  Not visualized.  Left Ovary   Not visualized.  Cul De Sac  No free fluid seen.  Adnexa  No abnormality visualized. ---------------------------------------------------------------------- Myomas  Site                     L(cm)      W(cm)      D(cm)       Location  Left Anterior            6.6        4.3        3.7 ----------------------------------------------------------------------  Blood Flow                  RI       PI       Comments ---------------------------------------------------------------------- Impression  Follow up growth due to type 2 diabetes and elevated AFP.  Normal interval growth with measurements consistent with  dates  Good fetal movement and amniotic fluid volume  Biophysical profile 8/8 ---------------------------------------------------------------------- Recommendations  Continue weekly testing  Delivery between 37-39 weeks. ----------------------------------------------------------------------               Sander Nephew, MD Electronically Signed Final Report   09/27/2020 12:21 pm ----------------------------------------------------------------------  [place] MFM FETAL BPP WO NON STRESS  Result Date: 09/19/2020 ----------------------------------------------------------------------  OBSTETRICS REPORT                       (Signed Final 09/19/2020 05:46 pm) ---------------------------------------------------------------------- Patient Info  ID #:       [id number]                          D.O.B.:  04-30-1986 (35 yrs)  Name:       [name]                    Visit Date: 09/19/2020 02:22 pm ---------------------------------------------------------------------- Performed By  Attending:        Tama High MD        Ref. Address:     [place]  Performed By:     Georgie Chard        [facility]                    RD[facility]                                     [facility] at  [facility]                                                             [facility]  Referred By:      [facility] ---------------------------------------------------------------------- Orders  #  Description                           Code        Ordered By  1  [place] MFM FETAL BPP WO NON               76819.01    RAVI [facility]     STRESS ----------------------------------------------------------------------  #  Order #                     Accession #                Episode #  1  [id number]                   [id number]                 [id number] ---------------------------------------------------------------------- Indications  Abnormal biochemical screen (QUAD - AFP        O28.9  MoM 2.75)  Pre-existing diabetes, type 2, in pregnancy,   O24.113  third trimester (metformin and Insulin)  Pregnancy resulting from assisted              O71.819  reproductive technology  Advanced maternal age primigravida 2+,        O83.513  third trimester(35 yrs)  Uterine fibroids                               O34.10  Large for gestational age fetus affecting      O38.60X0  management of mother  Fetal ECHO Normal  [redacted] weeks gestation of pregnancy                Z3A.33 ---------------------------------------------------------------------- Fetal Evaluation  Num Of Fetuses:         1  Fetal Heart Rate(bpm):  148  Cardiac Activity:       Observed  Presentation:           Cephalic  Placenta:               Anterior  P. Cord Insertion:      Previously Visualized  Amniotic Fluid  AFI FV:      Subjectively low-normal  AFI Sum(cm)     %Tile       Largest Pocket(cm)  9.63            15          2.8  RUQ(cm)       RLQ(cm)       LUQ(cm)        LLQ(cm)  2.59          2.75          1.49           2.8 ---------------------------------------------------------------------- Biophysical Evaluation  Amniotic F.V:   Pocket =>  2 cm             F. Tone:        Observed  F. Movement:    Observed                   Score:          8/8  F.  Breathing:   Observed ---------------------------------------------------------------------- OB History  Gravidity:    1 ---------------------------------------------------------------------- Gestational Age  LMP:           33w 1d        Date:  01/31/20                 EDD:   11/06/20  Best:          33w 1d     Det. By:  LMP  (01/31/20)          EDD:   11/06/20 ---------------------------------------------------------------------- Impression  Pregestational diabetes. Patient takes metformin and insulin  for control.  Amniotic fluid is normal and good fetal activity is seen  .Antenatal testing is reassuring. BPP 8/8.  This study was remotely read . ---------------------------------------------------------------------- Recommendations  -Continue weekly BPP till delivery. ----------------------------------------------------------------------                  Tama High, MD Electronically Signed Final Report   09/19/2020 05:46 pm ----------------------------------------------------------------------  [place] MFM FETAL BPP WO NON STRESS  Result Date: 09/12/2020 ----------------------------------------------------------------------  OBSTETRICS REPORT                       (Signed Final 09/12/2020 03:16 pm) ---------------------------------------------------------------------- Patient Info  ID #:       [id number]                          D.O.B.:  1985-07-19 (35 yrs)  Name:       [name]                    Visit Date: 09/12/2020 02:36 pm ---------------------------------------------------------------------- Performed By  Attending:        Tama High MD        Ref. Address:     [place]  Performed By:     Jeanene Erb BS,      [facility]         [facility]                    RDMS                                     Fetal Care at                                                             [place] [facility]  [facility]  Referred By:      [facility] ---------------------------------------------------------------------- Orders  #  Description                           Code        Ordered By  1  [place] MFM FETAL BPP WO NON               76819.01    RAVI [facility]     STRESS ----------------------------------------------------------------------  #  Order #                     Accession #                Episode #  1  [id number]                   [id number]                 [id number] ---------------------------------------------------------------------- Indications  Abnormal biochemical screen (QUAD - AFP        O28.9  MoM 2.75)  Pre-existing diabetes, type 2, in pregnancy,   O24.112  second trimester metformin  Lantus  Pregnancy resulting from assisted              O22.819  reproductive technology  Advanced maternal age primigravida 52+,        O33.513  third trimester(35 yrs)  Uterine fibroids                               O34.10  [redacted] weeks gestation of pregnancy                Z3A.32  Large for gestational age fetus affecting      O58.60X0  management of mother  Fetal ECHO Normal ---------------------------------------------------------------------- Fetal Evaluation  Num Of Fetuses:         1  Fetal Heart Rate(bpm):  150  Cardiac Activity:       Observed  Presentation:           Cephalic  Placenta:               Anterior  P. Cord Insertion:      Previously Visualized  Amniotic Fluid  AFI FV:      Within normal limits  AFI Sum(cm)     %Tile       Largest Pocket(cm)  15.7            56          5.1  RUQ(cm)       RLQ(cm)       LUQ(cm)        LLQ(cm)  5.1           4.3           2.8            3.5 ---------------------------------------------------------------------- Biophysical Evaluation  Amniotic F.V:   Pocket => 2 cm             F. Tone:        Observed  F. Movement:    Observed                   Score:  8/8  F. Breathing:   Observed  ---------------------------------------------------------------------- OB History  Gravidity:    1 ---------------------------------------------------------------------- Gestational Age  LMP:           32w 1d        Date:  01/31/20                 EDD:   11/06/20  Best:          32w 1d     Det. By:  LMP  (01/31/20)          EDD:   11/06/20 ---------------------------------------------------------------------- Anatomy  Stomach:               Appears normal, left   Bladder:                Appears normal                         sided ---------------------------------------------------------------------- Impression  For gestational diabetes.  Reportedly well controlled on  insulin and Metformin.  Blood pressure today at her office is  136/76 mmHg.  Amniotic fluid is normal and good fetal activity is seen  .Antenatal testing is reassuring. BPP 8/8.  Cephalic  presentation. ---------------------------------------------------------------------- Recommendations  -Continue weekly BPP till delivery. ----------------------------------------------------------------------                  Tama High, MD Electronically Signed Final Report   09/12/2020 03:16 pm ----------------------------------------------------------------------  [place] [facility] OB FOLLOW UP  Result Date: 09/27/2020 ----------------------------------------------------------------------  OBSTETRICS REPORT                       (Signed Final 09/27/2020 12:21 pm) ---------------------------------------------------------------------- Patient Info  ID #:       [id number]                          D.O.B.:  1986-04-21 (35 yrs)  Name:       MAKAI AGOSTINELLI                    Visit Date: 09/27/2020 11:15 am ---------------------------------------------------------------------- Performed By  Attending:        Sander Nephew      Ref. Address:     [address]                    MD                                                             [address]  Performed By:     Rodrigo Ran BS      [facility]                                 [facility]  Referred By:      [facility] ---------------------------------------------------------------------- Orders  #  Description                           Code        Ordered By  1  [place] [facility] OB FOLLOW UP                   [phone]    Sander Nephew  2  [place] MFM FETAL BPP WO NON               76819.01    CORENTHIAN     STRESS                                            BOOKER ----------------------------------------------------------------------  #  Order #                     Accession #                Episode #  1  [id number]                   [id number]                 [id number]  2  [id number]                   [id number]                 [id number] ---------------------------------------------------------------------- Indications  [redacted] weeks gestation of pregnancy                Z3A.34  Abnormal biochemical screen (QUAD - AFP        O28.9  MoM 2.75)  Pre-existing diabetes, type 2, in pregnancy,   O24.113  third trimester (metformin and Insulin)  Pregnancy resulting from assisted              O37.819  reproductive technology  Advanced maternal age primigravida 52+,        O67.513  third trimester(35 yrs)  Uterine fibroids                               O34.10  Large for gestational age fetus affecting      O17.60X0  management of mother  Fetal ECHO Normal ---------------------------------------------------------------------- Fetal Evaluation  Num Of Fetuses:         1  Fetal Heart Rate(bpm):  144  Cardiac  Activity:       Observed  Presentation:           Cephalic  Placenta:               Anterior  P. Cord Insertion:      Previously Visualized  Amniotic Fluid  AFI FV:      Within  normal limits  AFI Sum(cm)     %Tile       Largest Pocket(cm)  15.4            55          5.9  RUQ(cm)       RLQ(cm)       LUQ(cm)        LLQ(cm)  2.4           4.3           5.9            2.8 ---------------------------------------------------------------------- Biophysical Evaluation  Amniotic F.V:   Within normal limits       F. Tone:        Observed  F. Movement:    Observed                   Score:          8/8  F. Breathing:   Observed ---------------------------------------------------------------------- Biometry  BPD:        92  mm     G. Age:  37w 3d         99  %    CI:           80   %    70 - 86                                                          FL/HC:      19.8   %    19.4 - 21.8  HC:       325   mm     G. Age:  36w 6d         78  %    HC/AC:      0.99        0.96 - 1.11  AC:      327.8  mm     G. Age:  36w 5d         97  %    FL/BPD:     70.1   %    71 - 87  FL:       64.5  mm     G. Age:  33w 2d         17  %    FL/AC:      19.7   %    20 - 24  HUM:      53.7  mm     G. Age:  31w 2d        < 5  %  Est. FW:    2803  gm      6 lb 3 oz     88  % ---------------------------------------------------------------------- OB History  Gravidity:    1 ---------------------------------------------------------------------- Gestational Age  LMP:           34w 2d  Date:  01/31/20                 EDD:   11/06/20  U/S Today:     36w 1d                                        EDD:   10/24/20  Best:          34w 2d     Det. By:  LMP  (01/31/20)          EDD:   11/06/20 ---------------------------------------------------------------------- Anatomy  Cranium:               Appears normal         LVOT:                   Appears normal  Cavum:                 Previously seen        Aortic Arch:            Previously seen  Ventricles:            Previously seen        Ductal Arch:            Previously seen  Choroid Plexus:        Appears normal         Diaphragm:              Appears normal  Cerebellum:             Previously seen        Stomach:                Appears normal, left                                                                        sided  Posterior Fossa:       Previously seen        Abdomen:                Appears normal  Nuchal Fold:           Previously seen        Abdominal Wall:         Previously seen  Face:                  Appears normal         Cord Vessels:           Previously seen                         (orbits and profile)  Lips:                  Previously seen        Kidneys:                Appear normal  Palate:                Previously seen  Bladder:                Appears normal  Thoracic:              Appears normal         Spine:                  Limited views                                                                        previously seen  Heart:                 Appears normal         Upper Extremities:      Previously seen                         (4CH, axis, and                         situs)  RVOT:                  Appears normal         Lower Extremities:      Previously seen  Other:  Female gender previously seen. Heels previously visualized. Lenses          visualized. Nasal bone visualized. Technically difficult due to fetal          position. ---------------------------------------------------------------------- Cervix Uterus Adnexa  Cervix  Not visualized (advanced GA >24wks)  Uterus  Single fibroid noted, see table below.  Right Ovary  Not visualized.  Left Ovary  Not visualized.  Cul De Sac  No free fluid seen.  Adnexa  No abnormality visualized. ---------------------------------------------------------------------- Myomas  Site                     L(cm)      W(cm)      D(cm)       Location  Left Anterior            6.6        4.3        3.7 ----------------------------------------------------------------------  Blood Flow                  RI       PI       Comments ---------------------------------------------------------------------- Impression  Follow up  growth due to type 2 diabetes and elevated AFP.  Normal interval growth with measurements consistent with  dates  Good fetal movement and amniotic fluid volume  Biophysical profile 8/8 ---------------------------------------------------------------------- Recommendations  Continue weekly testing  Delivery between 37-39 weeks. ----------------------------------------------------------------------               Sander Nephew, MD Electronically Signed Final Report   09/27/2020 12:21 pm ----------------------------------------------------------------------   MAU Course  Procedures  MDM -preeclampsia evaluation without severe range BP in MAU on admission, but pt did have single severe range pressure in MAU -symptoms include: none -CBC: H/H 12.6/37, platelets 202 -CMP: serum creatinine 0.58, AST/ALT 29/26 -PCr: 0.16 -pt requests evaluation of PPROM without pelvic exam, AmniSure ordered -AmniSure: negative, fern negative by RN -WetPrep: WNL -GC/CT  collected -EFM: reactive       -baseline: 130       -variability: moderate       -accels: present, 15x15       -decels: absent       -TOCO: quiet -consulted with Dr. Nehemiah Settle regarding labs results and single severe range pressure, per Dr. Nehemiah Settle, pt OK to be discharged home with f/u planned at [facility] on 10/11/2020 -patient notified of results of AmniSure and reports she does not believe that her water is not broken. Patient advised of accuracy of AmniSure swab and also offered pelvic exam to further assess for rupture, pt declines pelvic exam again. -pt discharged to home in stable condition  Orders Placed This Encounter  Procedures  . Wet prep, genital    Standing Status:   Standing    Number of Occurrences:   1  . CBC    Standing Status:   Standing    Number of Occurrences:   1  . Comprehensive metabolic panel    Standing Status:   Standing    Number of Occurrences:   1  . Protein / creatinine ratio, urine    Standing Status:   Standing     Number of Occurrences:   1  . Amnisure rupture of membrane (rom)not at [facility]    Standing Status:   Standing    Number of Occurrences:   1  . Discharge patient    Order Specific Question:   Discharge disposition    Answer:   01-Home or Self Care [1]    Order Specific Question:   Discharge patient date    Answer:   10/09/2020    No orders of the defined types were placed in this encounter.  Assessment and Plan   1. Encounter for suspected premature rupture of amniotic membranes, with rupture of membranes not found   2. Elevated blood pressure reading without diagnosis of hypertension   3. [redacted] weeks gestation of pregnancy   4. NST (non-stress test) reactive     Allergies as of 10/09/2020   No Known Allergies     Medication List    TAKE these medications   Accu-Chek Guide test strip Generic drug: glucose blood Use as instructed; please check blood glucose 4 times daily.   Accu-Chek Softclix Lancets lancets Use as instructed; please check blood glucose 4 times daily.   aspirin 81 MG EC tablet Commonly known as: EC-81 Aspirin Take 1 tablet (81 mg total) by mouth daily. Swallow whole.   famotidine 20 MG tablet Commonly known as: PEPCID Take 1 tablet (20 mg total) by mouth daily.   Lantus SoloStar 100 UNIT/ML Solostar Pen Generic drug: insulin glargine Inject 36 Units into the skin at bedtime.   metFORMIN 1000 MG tablet Commonly known as: GLUCOPHAGE Take 1 tablet (1,000 mg total) by mouth 2 (two) times daily with a meal.   Prenatal 27-1 MG Tabs TAKE 1 TABLET BY MOUTH DAILY AT 12 NOON.       -will call with culture results, if positive -pt advised must keep appt with MFM on Wednesday for BPP/BP check -message sent to Dr. Dione Plover regarding patient next visit and possible need for delivery at 37 weeks -Reviewed warning blood pressure symptoms. She should come to MAU if she has elevated pressures and any of the following:  -headache not relieved with tylenol, rest,  hydration -blurry vision, floating spots in her vision -sudden full-body edema or facial edema -RUQ pain that is constant. -chest pain or  shortness of breath -new onset or sudden worsening of nausea and vomiting These symptoms may indicate that her blood pressure is worsening and she may be developing gestational hypertension or pre-eclampsia, which is an emergency.  -return MAU precautions given -pt advised if leaking occurs again to put on pad, and if pad is soaked in 2-3 hours to return to MAU for evaluation -pt discharged to home in stable condition  Elmyra Ricks E Camarie Mctigue 10/09/2020, 5:25 PM

## 2020-10-09 NOTE — Discharge Instructions (Signed)
Preeclampsia and Eclampsia Preeclampsia is a serious condition that may develop during pregnancy. This condition involves high blood pressure during pregnancy and causes symptoms such as headaches, vision changes, and increased swelling in the legs, hands, and face. Preeclampsia occurs after 20 weeks of pregnancy. Eclampsia is a seizure that happens from worsening preeclampsia. Diagnosing and managing preeclampsia early is important. If not treated early, it can cause serious problems for mother and baby. There is no cure for this condition. However, during pregnancy, delivering the baby may be the best treatment for preeclampsia or eclampsia. For most women, symptoms of preeclampsia and eclampsia go away after giving birth. In rare cases, a woman may develop preeclampsia or eclampsia after giving birth. This usually occurs within 48 hours after childbirth but may occur up to 6 weeks after giving birth. What are the causes? The cause of this condition is not known. What increases the risk? The following factors make you more likely to develop preeclampsia:  Being pregnant for the first time or being pregnant with multiples.  Having had preeclampsia or a condition called hemolysis, elevated liver enzymes, and low platelet count (HELLP)syndrome during a past pregnancy.  Having a family history of preeclampsia.  Being older than age 83.  Being obese.  Becoming pregnant through fertility treatments. Conditions that reduce blood flow or oxygen to your placenta and baby may also increase your risk. These include:  High blood pressure before, during, or immediately following pregnancy.  Kidney disease.  Diabetes.  Blood clotting disorders.  Autoimmune diseases, such as lupus.  Sleep apnea. What are the signs or symptoms? Common symptoms of this condition include:  A severe, throbbing headache that does not go away.  Vision problems, such as blurred or double vision and light  sensitivity.  Pain in the stomach, especially the right upper region.  Pain in the shoulder. Other symptoms that may develop as the condition gets worse include:  Sudden weight gain because of fluid buildup in the body. This causes swelling of the face, hands, legs, and feet.  Severe nausea and vomiting.  Urinating less than usual.  Shortness of breath.  Seizures. How is this diagnosed? Your health care provider will ask you about symptoms and check for signs of preeclampsia during your prenatal visits. You will also have routine tests, including:  Checking your blood pressure.  Urine tests to check for protein.  Blood tests to assess your organ function.  Monitoring your baby's heart rate.  Ultrasounds to check fetal growth.   How is this treated? You and your health care provider will determine the treatment that is best for you. Treatment may include:  Frequent prenatal visits to check for preeclampsia.  Medicine to lower your blood pressure.  Medicine to prevent seizures.  Low-dose aspirin during your pregnancy.  Staying in the hospital, in severe cases. You will be given medicines to control your blood pressure and the amount of fluids in your body.  Delivering your baby. Work with your health care provider to manage any chronic health conditions, such as diabetes or kidney problems. Also, work with your health care provider to manage weight gain during pregnancy. Follow these instructions at home: Eating and drinking  Drink enough fluid to keep your urine pale yellow.  Avoid caffeine. Caffeine may increase blood pressure and heart rate and lead to dehydration.  Reduce the amount of salt that you eat. Lifestyle  Do not use any products that contain nicotine or tobacco. These products include cigarettes, chewing tobacco, and  vaping devices, such as e-cigarettes. If you need help quitting, ask your health care provider.  Do not use alcohol or drugs.  Avoid  stress as much as possible.  Rest and get plenty of sleep. General instructions  Take over-the-counter and prescription medicines only as told by your health care provider.  When lying down, lie on your left side. This keeps pressure off your major blood vessels.  When sitting or lying down, raise (elevate) your feet. Try putting pillows underneath your lower legs.  Exercise regularly. Ask your health care provider what kinds of exercise are best for you.  Check your blood pressure as often as recommended by your health care provider.  Keep all prenatal and follow-up visits. This is important.   Contact a health care provider if:  You have symptoms that may need treatment or closer monitoring. These include: ? Headaches. ? Stomach pain or nausea and vomiting. ? Shoulder pain. ? Vision problems, such as spots in front of your eyes or blurry vision. ? Sudden weight gain or increased swelling in your face, hands, legs, and feet. ? Increased anxiety or feeling of impending doom. ? Signs or symptoms of labor. Get help right away if:  You have any of the following symptoms: ? A seizure. ? Shortness of breath or trouble breathing. ? Trouble speaking or slurred speech. ? Fainting. ? Chest pain. These symptoms may represent a serious problem that is an emergency. Do not wait to see if the symptoms will go away. Get medical help right away. Call your local emergency services (911 in the [place] Do not drive yourself to the hospital. Summary  Preeclampsia is a serious condition that may develop during pregnancy.  Diagnosing and treating preeclampsia early is very important.  Keep all prenatal and follow-up visits. This is important.  Get help right away if you have a seizure, shortness of breath or trouble breathing, trouble speaking or slurred speech, chest pain, or fainting. This information is not intended to replace advice given to you by your health care provider. Make sure you  discuss any questions you have with your health care provider. Document Revised: 03/23/2020 Document Reviewed: 03/23/2020 Elsevier Patient Education  2021 [place].        Hypertension During Pregnancy High blood pressure (hypertension) is when the force of blood pumping through the arteries is high enough to cause problems with your health. Arteries are blood vessels that carry blood from the heart throughout the body. Hypertension during pregnancy can cause problems for you and your baby. It can be mild or severe. There are different types of hypertension that can happen during pregnancy. These include:  Chronic hypertension. This happens when you had high blood pressure before you became pregnant, and it continues during the pregnancy. Hypertension that develops before you are [redacted] weeks pregnant and continues during the pregnancy is also called chronic hypertension. If you have chronic hypertension, it will not go away after you have your baby. You will need follow-up visits with your health care provider after you have your baby. Your health care provider may want you to keep taking medicine for your blood pressure.  Gestational hypertension. This is hypertension that develops after the 20th week of pregnancy. Gestational hypertension usually goes away after you have your baby, but your health care provider will need to monitor your blood pressure to make sure that it is getting better.  Postpartum hypertension. This is high blood pressure that was present before delivery and continues after delivery  or that starts after delivery. This usually occurs within 48 hours after childbirth but may occur up to 6 weeks after giving birth. When hypertension during pregnancy is severe, it is a medical emergency that requires treatment right away. How does this affect me? Women who have hypertension during pregnancy have a greater chance of developing hypertension later in life or during future  pregnancies. In some cases, hypertension during pregnancy can cause serious complications, such as:  Stroke.  Heart attack.  Injury to other organs, such as kidneys, lungs, or liver.  Preeclampsia.  A condition called hemolysis, elevated liver enzymes, and low platelet count (HELLP) syndrome.  Convulsions or seizures.  Placental abruption. How does this affect my baby? Hypertension during pregnancy can affect your baby. Your baby may:  Be born early (prematurely).  Not weigh as much as he or she should at birth (low birth weight).  Not tolerate labor well, leading to an unplanned cesarean delivery. This condition may also result in a baby's death before birth (stillbirth). What are the risks? There are certain factors that make it more likely for you to develop hypertension during pregnancy. These include:  Having hypertension during a previous pregnancy or a family history of hypertension.  Being overweight.  Being age 71 or older.  Being pregnant for the first time.  Being pregnant with more than one baby.  Becoming pregnant using fertilization methods, such as IVF (in vitro fertilization).  Having other medical problems, such as diabetes, kidney disease, or lupus. What can I do to lower my risk? The exact cause of hypertension during pregnancy is not known. You may be able to lower your risk by:  Maintaining a healthy weight.  Eating a healthy and balanced diet.  Following your health care provider's instructions about treating any long-term conditions that you had before becoming pregnant. It is very important to keep all of your prenatal care appointments. Your health care provider will check your blood pressure and make sure that your pregnancy is progressing as expected. If a problem is found, early treatment can prevent complications.   How is this treated? Treatment for hypertension during pregnancy varies depending on the type of hypertension you have and  how serious it is.  If you were taking medicine for high blood pressure before you became pregnant, talk with your health care provider. You may need to change medicine during pregnancy because some medicines, like ACE inhibitors, may not be considered safe for your baby.  If you have gestational hypertension, your health care provider may order medicine to treat this during pregnancy.  If you are at risk for preeclampsia, your health care provider may recommend that you take a low-dose aspirin during your pregnancy.  If you have severe hypertension, you may need to be hospitalized so you and your baby can be monitored closely. You may also need to be given medicine to lower your blood pressure.  In some cases, if your condition gets worse, you may need to deliver your baby early. Follow these instructions at home: Eating and drinking  Drink enough fluid to keep your urine pale yellow.  Avoid caffeine.   Lifestyle  Do not use any products that contain nicotine or tobacco. These products include cigarettes, chewing tobacco, and vaping devices, such as e-cigarettes. If you need help quitting, ask your health care provider.  Do not use alcohol or drugs.  Avoid stress as much as possible.  Rest and get plenty of sleep.  Regular exercise can help to  reduce your blood pressure. Ask your health care provider what kinds of exercise are best for you. General instructions  Take over-the-counter and prescription medicines only as told by your health care provider.  Keep all prenatal and follow-up visits. This is important. Contact a health care provider if:  You have symptoms that your health care provider told you may require more treatment or monitoring, such as: ? Headaches. ? Nausea or vomiting. ? Abdominal pain. ? Dizziness. ? Light-headedness. Get help right away if:  You have symptoms of serious complications, such as: ? Severe abdominal pain that does not get better with  treatment. ? A severe headache that does not get better, blurred vision, or double vision. ? Vomiting that does not get better. ? Sudden, rapid weight gain or swelling in your hands, ankles, or face. ? Vaginal bleeding. ? Blood in your urine. ? Shortness of breath or chest pain. ? Weakness on one side of your body or difficulty speaking.  Your baby is not moving as much as usual. These symptoms may represent a serious problem that is an emergency. Do not wait to see if the symptoms will go away. Get medical help right away. Call your local emergency services (911 in the [place] Do not drive yourself to the hospital. Summary  Hypertension during pregnancy can cause problems for you and your baby.  Treatment for hypertension during pregnancy varies depending on the type of hypertension you have and how serious it is.  Keep all prenatal and follow-up visits. This is important.  Get help right away if you have symptoms of serious complications related to high blood pressure. This information is not intended to replace advice given to you by your health care provider. Make sure you discuss any questions you have with your health care provider. Document Revised: 03/23/2020 Document Reviewed: 03/23/2020 Elsevier Patient Education  2021 [place].        Signs and Symptoms of Labor Labor is the body's natural process of moving the baby and the placenta out of the uterus. The process of labor usually starts when the baby is full-term, between 30 and 40 weeks of pregnancy. Signs and symptoms that you are close to going into labor As your body prepares for labor and the birth of your baby, you may notice the following symptoms in the weeks and days before true labor starts:  Passing a small amount of thick, bloody mucus from your vagina. This is called normal bloody show or losing your mucus plug. This may happen more than a week before labor begins, or right before labor begins, as the  opening of the cervix starts to widen (dilate). For some women, the entire mucus plug passes at once. For others, pieces of the mucus plug may gradually pass over several days.  Your baby moving (dropping) lower in your pelvis to get into position for birth (lightening). When this happens, you may feel more pressure on your bladder and pelvic bone and less pressure on your ribs. This may make it easier to breathe. It may also cause you to need to urinate more often and have problems with bowel movements.  Having "practice contractions," also called Braxton Hicks contractions or false labor. These occur at irregular (unevenly spaced) intervals that are more than 10 minutes apart. False labor contractions are common after exercise or sexual activity. They will stop if you change position, rest, or drink fluids. These contractions are usually mild and do not get stronger over time. They may  feel like: ? A backache or back pain. ? Mild cramps, similar to menstrual cramps. ? Tightening or pressure in your abdomen. Other early symptoms include:  Nausea or loss of appetite.  Diarrhea.  Having a sudden burst of energy, or feeling very tired.  Mood changes.  Having trouble sleeping.   Signs and symptoms that labor has begun Signs that you are in labor may include:  Having contractions that come at regular (evenly spaced) intervals and increase in intensity. This may feel like more intense tightening or pressure in your abdomen that moves to your back. ? Contractions may also feel like rhythmic pain in your upper thighs or back that comes and goes at regular intervals. ? For first-time mothers, this change in intensity of contractions often occurs at a more gradual pace. ? Women who have given birth before may notice a more rapid progression of contraction changes.  Feeling pressure in the vaginal area.  Your water breaking (rupture of membranes). This is when the sac of fluid that surrounds your  baby breaks. Fluid leaking from your vagina may be clear or blood-tinged. Labor usually starts within 24 hours of your water breaking, but it may take longer to begin. ? Some women may feel a sudden gush of fluid. ? Others notice that their underwear repeatedly becomes damp. Follow these instructions at home:  When labor starts, or if your water breaks, call your health care provider or nurse care line. Based on your situation, they will determine when you should go in for an exam.  During early labor, you may be able to rest and manage symptoms at home. Some strategies to try at home include: ? Breathing and relaxation techniques. ? Taking a warm bath or shower. ? Listening to music. ? Using a heating pad on the lower back for pain. If you are directed to use heat:  Place a towel between your skin and the heat source.  Leave the heat on for 20-30 minutes.  Remove the heat if your skin turns bright red. This is especially important if you are unable to feel pain, heat, or cold. You may have a greater risk of getting burned.   Contact a health care provider if:  Your labor has started.  Your water breaks. Get help right away if:  You have painful, regular contractions that are 5 minutes apart or less.  Labor starts before you are [redacted] weeks along in your pregnancy.  You have a fever.  You have bright red blood coming from your vagina.  You do not feel your baby moving.  You have a severe headache with or without vision problems.  You have severe nausea, vomiting, or diarrhea.  You have chest pain or shortness of breath. These symptoms may represent a serious problem that is an emergency. Do not wait to see if the symptoms will go away. Get medical help right away. Call your local emergency services (911 in the [place] Do not drive yourself to the hospital. Summary  Labor is your body's natural process of moving your baby and the placenta out of your uterus.  The process of  labor usually starts when your baby is full-term, between 76 and 40 weeks of pregnancy.  When labor starts, or if your water breaks, call your health care provider or nurse care line. Based on your situation, they will determine when you should go in for an exam. This information is not intended to replace advice given to you by your health  care provider. Make sure you discuss any questions you have with your health care provider. Document Revised: 04/22/2020 Document Reviewed: 04/22/2020 Elsevier Patient Education  2021 [place].        https://www.ncbi.nlm.nih.gov/books/NBK532888/">  Prelabor Rupture and Preterm Prelabor Rupture of Membranes  Rupture of membranes is when the sac of fluid that surrounds a baby in the uterus (amniotic sac) breaks open. This is commonly referred to as your water breaking. If your water breaks before labor starts (prematurely), it is called prelabor rupture of membranes (PROM). If PROM occurs before 37 weeks of pregnancy, it is called preterm prelabor rupture of membranes (PPROM). Because the amniotic sac keeps infection out and performs other important functions, having the amniotic sac rupture before 37 weeks of pregnancy can lead to serious problems for the mother and the baby. These include an increased risk of:  Needing to have a cesarean delivery, or C-section.  A serious infection for both the mother and the baby.  Respiratory distress syndrome in the baby.  The baby's lungs not developing as much as normal.  Bleeding in the baby's brain.  The baby dying. PPROM requires immediate attention from a health care provider. What are the causes? When PROM occurs at 37 weeks of pregnancy or later, it is usually caused by natural weakening of the membranes and friction caused by contractions. PPROM is usually caused by infection. In many cases, the cause is not known. What increases the risk? The following factors may make you more likely to have  PPROM:  Infection.  Having had PPROM in a previous pregnancy.  Having a short cervix.  Bleeding during the second or third trimester.  Low body mass index (BMI), which is an estimate of body fat.  Smoking or using drugs. What are the signs or symptoms? Signs of PROM and PPROM include:  A sudden gush or slow leaking of fluid from the vagina.  Constant wet underwear. Sometimes, women mistake the leaking or wetness for urine, especially if the leak is slow and not a gush of fluid. If there is constant leaking or if your underwear continues to get wet, your membranes have likely ruptured. How is this diagnosed? This condition may be diagnosed based on:  Your symptoms and medical history.  A physical exam. This will include a cervical exam that is done using a lubricated instrument (speculum) to check whether the cervix has softened or started to open (dilate) and to look for amniotic fluid in the vagina.  Tests to check for the presence of amniotic fluid in the vagina. How is this treated? Treatment will depend on many factors, such as how many weeks you have been pregnant (how far along you are), the development of the baby, and other complications that may occur. Treatment may include:  Taking steps to cause you to start the labor process (labor induction). This may be done if: ? You have PROM. In this case, labor may be induced within 24 hours if you are not having contractions. ? You have PPROM that happens after you have reached the 34th week of pregnancy. Labor may be induced if you are not having contractions. ? You have PPROM that happens before the 34th week of pregnancy and complications occur for you or the baby.  Monitoring you and the baby closely for signs of infection or other complications.  Medicines, such as: ? An antibiotic to lower the chances of developing an infection. ? A steroid to help mature the baby's lungs more quickly. ?  A medicine to help prevent  cerebral palsy in your baby. Follow these instructions at home: You may need to stay in the hospital for treatment and monitoring. If you are allowed to go home, follow instructions from your health care provider. Make sure you:  Rest as told by your health care provider.  Take over-the-counter and prescription medicines only as told by your health care provider.  Do not use any products that contain nicotine or tobacco, such as cigarettes, e-cigarettes, and chewing tobacco. If you need help quitting, ask your health care provider.  Do not drink alcohol.  Return to the hospital as told by your health care provider. Contact a health care provider if:  You have a sudden gush or slow leaking of fluid from the vagina after 37 weeks of pregnancy.  You have constant wet underwear after 37 weeks of pregnancy. Get help right away if:  Your amniotic sac ruptures before 37 weeks of pregnancy.  You are being monitored at home after PROM or PPROM and you develop signs of an infection, such as fever, chills, body aches, or abdominal pain.  You have signs of labor, such as abdominal pain or menstrual-like cramping.  You have vaginal bleeding.  The color of your vaginal fluid changes from clear to green, brown, or bloody.  You see the umbilical cord sticking out from your vagina or you feel the cord in your vagina. Summary  Rupture of membranes is when the sac of fluid that surrounds a baby in the uterus (amniotic sac) breaks open.  If rupture of membranes happens after 37 weeks of pregnancy and labor has not started, it is called PROM. If it happens before 37 weeks of pregnancy, it is called PPROM.  PPROM causes an increased risk of some serious problems for you and your baby.  Follow your health care provider's instructions for when to seek help. This information is not intended to replace advice given to you by your health care provider. Make sure you discuss any questions you have with  your health care provider. Document Revised: 07/22/2019 Document Reviewed: 07/22/2019 Elsevier Patient Education  2021 [employer].

## 2020-10-09 NOTE — Telephone Encounter (Signed)
Pt called office with report of vaginal bleeding however had difficulty expressing her concerns due to language barrier. I called her back with [facility] interpreter # [phone] to discuss her concern.  Pt then stated that she is not having bleeding but had a very large gush of clear fluid from vagina @ 0200. She returned to bed and got up about 1.5 hrs ago. Since that time she has continued to have small amounts of clear fluid leaking. Pt endorses +FM. I advised pt that her water may have broken and she requires evaluation @ [facility]. The address was provided and pt agreed to go to hospital now.

## 2020-10-10 ENCOUNTER — Ambulatory Visit: Payer: Medicaid Other

## 2020-10-10 LAB — GC/CHLAMYDIA PROBE AMP (~~LOC~~) NOT AT ARMC
Chlamydia: NEGATIVE
Comment: NEGATIVE
Comment: NORMAL
Neisseria Gonorrhea: NEGATIVE

## 2020-10-11 ENCOUNTER — Ambulatory Visit: Payer: Self-pay

## 2020-10-11 ENCOUNTER — Ambulatory Visit (INDEPENDENT_AMBULATORY_CARE_PROVIDER_SITE_OTHER): Payer: Medicaid Other | Admitting: Obstetrics and Gynecology

## 2020-10-11 VITALS — BP 139/81 | HR 91 | Wt 172.8 lb

## 2020-10-11 DIAGNOSIS — O24119 Pre-existing diabetes mellitus, type 2, in pregnancy, unspecified trimester: Secondary | ICD-10-CM

## 2020-10-11 DIAGNOSIS — Z3A36 36 weeks gestation of pregnancy: Secondary | ICD-10-CM

## 2020-10-11 NOTE — Progress Notes (Signed)
Pt had [facility] visit on 3/28 for r/o PPROM which was negative. She now reports increased amount of clear fluid leaking since 3/28 which requires her to wear a pad.

## 2020-10-11 NOTE — Progress Notes (Signed)
Patient seen for rule out rupture exam. She is a g1p0 at [redacted]w[redacted]d.   Speculum exam performed - no pooling, cervix visually closed  Ferning exam negative.   Discussed with patient via in person interpreter. Answered all questions. GBS swab also collected. Patient has follow up visit on 4/4 and was instructed to keep appointment.   Sharene Skeans, MD [facility] Family Medicine Fellow, [facility] for [facility], [place]

## 2020-10-15 ENCOUNTER — Other Ambulatory Visit: Payer: Self-pay

## 2020-10-15 LAB — CULTURE, BETA STREP (GROUP B ONLY): Strep Gp B Culture: NEGATIVE

## 2020-10-15 MED ORDER — LANTUS SOLOSTAR 100 UNIT/ML ~~LOC~~ SOPN: 20.0000 [IU] | PEN_INJECTOR | Freq: Every day | SUBCUTANEOUS | 11 refills | Status: DC

## 2020-10-15 MED ORDER — LANTUS SOLOSTAR 100 UNIT/ML ~~LOC~~ SOPN: 24.0000 [IU] | PEN_INJECTOR | Freq: Every day | SUBCUTANEOUS | 11 refills | Status: DC

## 2020-10-16 ENCOUNTER — Ambulatory Visit (INDEPENDENT_AMBULATORY_CARE_PROVIDER_SITE_OTHER): Payer: Medicaid Other | Admitting: *Deleted

## 2020-10-16 ENCOUNTER — Inpatient Hospital Stay (HOSPITAL_COMMUNITY)
Admission: AD | Admit: 2020-10-16 | Discharge: 2020-10-20 | DRG: 786 | Disposition: A | Discharge status: Home / Self Care | Payer: Medicaid Other | Attending: Obstetrics and Gynecology | Admitting: Obstetrics and Gynecology

## 2020-10-16 ENCOUNTER — Other Ambulatory Visit: Payer: Self-pay

## 2020-10-16 ENCOUNTER — Ambulatory Visit (INDEPENDENT_AMBULATORY_CARE_PROVIDER_SITE_OTHER): Payer: Medicaid Other | Admitting: Obstetrics and Gynecology

## 2020-10-16 ENCOUNTER — Encounter (HOSPITAL_COMMUNITY): Payer: Self-pay | Admitting: Student

## 2020-10-16 ENCOUNTER — Ambulatory Visit: Payer: Self-pay

## 2020-10-16 VITALS — BP 152/83 | HR 85 | Wt 174.1 lb

## 2020-10-16 DIAGNOSIS — O2412 Pre-existing diabetes mellitus, type 2, in childbirth: Secondary | ICD-10-CM | POA: Diagnosis present

## 2020-10-16 DIAGNOSIS — O09813 Supervision of pregnancy resulting from assisted reproductive technology, third trimester: Secondary | ICD-10-CM

## 2020-10-16 DIAGNOSIS — O1404 Mild to moderate pre-eclampsia, complicating childbirth: Secondary | ICD-10-CM | POA: Diagnosis present

## 2020-10-16 DIAGNOSIS — Z6791 Unspecified blood type, Rh negative: Secondary | ICD-10-CM

## 2020-10-16 DIAGNOSIS — O24119 Pre-existing diabetes mellitus, type 2, in pregnancy, unspecified trimester: Secondary | ICD-10-CM

## 2020-10-16 DIAGNOSIS — Z20822 Contact with and (suspected) exposure to covid-19: Secondary | ICD-10-CM | POA: Diagnosis present

## 2020-10-16 DIAGNOSIS — O360130 Maternal care for anti-D [Rh] antibodies, third trimester, not applicable or unspecified: Secondary | ICD-10-CM | POA: Diagnosis not present

## 2020-10-16 DIAGNOSIS — O26893 Other specified pregnancy related conditions, third trimester: Secondary | ICD-10-CM | POA: Diagnosis present

## 2020-10-16 DIAGNOSIS — O09513 Supervision of elderly primigravida, third trimester: Secondary | ICD-10-CM | POA: Diagnosis not present

## 2020-10-16 DIAGNOSIS — O24113 Pre-existing diabetes mellitus, type 2, in pregnancy, third trimester: Secondary | ICD-10-CM

## 2020-10-16 DIAGNOSIS — Z7984 Long term (current) use of oral hypoglycemic drugs: Secondary | ICD-10-CM | POA: Diagnosis not present

## 2020-10-16 DIAGNOSIS — D251 Intramural leiomyoma of uterus: Secondary | ICD-10-CM | POA: Diagnosis present

## 2020-10-16 DIAGNOSIS — O3663X0 Maternal care for excessive fetal growth, third trimester, not applicable or unspecified: Secondary | ICD-10-CM | POA: Diagnosis not present

## 2020-10-16 DIAGNOSIS — O139 Gestational [pregnancy-induced] hypertension without significant proteinuria, unspecified trimester: Secondary | ICD-10-CM | POA: Diagnosis present

## 2020-10-16 DIAGNOSIS — E119 Type 2 diabetes mellitus without complications: Secondary | ICD-10-CM | POA: Diagnosis present

## 2020-10-16 DIAGNOSIS — O280 Abnormal hematological finding on antenatal screening of mother: Secondary | ICD-10-CM | POA: Diagnosis not present

## 2020-10-16 DIAGNOSIS — O2413 Pre-existing diabetes mellitus, type 2, in the puerperium: Secondary | ICD-10-CM | POA: Diagnosis not present

## 2020-10-16 DIAGNOSIS — O163 Unspecified maternal hypertension, third trimester: Secondary | ICD-10-CM

## 2020-10-16 DIAGNOSIS — E118 Type 2 diabetes mellitus with unspecified complications: Secondary | ICD-10-CM

## 2020-10-16 DIAGNOSIS — Z3A37 37 weeks gestation of pregnancy: Secondary | ICD-10-CM | POA: Diagnosis not present

## 2020-10-16 DIAGNOSIS — O134 Gestational [pregnancy-induced] hypertension without significant proteinuria, complicating childbirth: Secondary | ICD-10-CM | POA: Diagnosis present

## 2020-10-16 DIAGNOSIS — O09519 Supervision of elderly primigravida, unspecified trimester: Secondary | ICD-10-CM

## 2020-10-16 DIAGNOSIS — Z794 Long term (current) use of insulin: Secondary | ICD-10-CM

## 2020-10-16 DIAGNOSIS — O3413 Maternal care for benign tumor of corpus uteri, third trimester: Secondary | ICD-10-CM | POA: Diagnosis present

## 2020-10-16 DIAGNOSIS — O1493 Unspecified pre-eclampsia, third trimester: Secondary | ICD-10-CM

## 2020-10-16 DIAGNOSIS — Z98891 History of uterine scar from previous surgery: Secondary | ICD-10-CM

## 2020-10-16 DIAGNOSIS — Z8759 Personal history of other complications of pregnancy, childbirth and the puerperium: Secondary | ICD-10-CM | POA: Insufficient documentation

## 2020-10-16 DIAGNOSIS — O411230 Chorioamnionitis, third trimester, not applicable or unspecified: Secondary | ICD-10-CM | POA: Diagnosis present

## 2020-10-16 DIAGNOSIS — O24919 Unspecified diabetes mellitus in pregnancy, unspecified trimester: Secondary | ICD-10-CM | POA: Diagnosis present

## 2020-10-16 DIAGNOSIS — O1405 Mild to moderate pre-eclampsia, complicating the puerperium: Secondary | ICD-10-CM | POA: Diagnosis not present

## 2020-10-16 HISTORY — DX: Essential (primary) hypertension: I10

## 2020-10-16 HISTORY — DX: Benign neoplasm of connective and other soft tissue, unspecified: D21.9

## 2020-10-16 LAB — PROTEIN / CREATININE RATIO, URINE
Creatinine, Urine: 62.38 mg/dL
Protein Creatinine Ratio: 0.34 mg/mg{Cre} — ABNORMAL HIGH (ref 0.00–0.15)
Total Protein, Urine: 21 mg/dL

## 2020-10-16 LAB — COMPREHENSIVE METABOLIC PANEL
ALT: 28 U/L (ref 0–44)
AST: 28 U/L (ref 15–41)
Albumin: 2.8 g/dL — ABNORMAL LOW (ref 3.5–5.0)
Alkaline Phosphatase: 173 U/L — ABNORMAL HIGH (ref 38–126)
Anion gap: 10 (ref 5–15)
BUN: 13 mg/dL (ref 6–20)
CO2: 16 mmol/L — ABNORMAL LOW (ref 22–32)
Calcium: 9.2 mg/dL (ref 8.9–10.3)
Chloride: 111 mmol/L (ref 98–111)
Creatinine, Ser: 0.52 mg/dL (ref 0.44–1.00)
GFR, Estimated: >60 mL/min (ref >60)
Glucose, Bld: 77 mg/dL (ref 70–99)
Potassium: 4.1 mmol/L (ref 3.5–5.1)
Sodium: 137 mmol/L (ref 135–145)
Total Bilirubin: 0.3 mg/dL (ref 0.3–1.2)
Total Protein: 6.3 g/dL — ABNORMAL LOW (ref 6.5–8.1)

## 2020-10-16 LAB — TYPE AND SCREEN
ABO/RH(D): O NEG
Antibody Screen: NEGATIVE
Weak D: POSITIVE

## 2020-10-16 LAB — CBC
HCT: 39.2 % (ref 36.0–46.0)
Hemoglobin: 13.1 g/dL (ref 12.0–15.0)
MCH: 29.3 pg (ref 26.0–34.0)
MCHC: 33.4 g/dL (ref 30.0–36.0)
MCV: 87.7 fL (ref 80.0–100.0)
Platelets: 254 10*3/uL (ref 150–400)
RBC: 4.47 MIL/uL (ref 3.87–5.11)
RDW: 14.3 % (ref 11.5–15.5)
WBC: 6.3 10*3/uL (ref 4.0–10.5)
nRBC: 0 % (ref 0.0–0.2)

## 2020-10-16 LAB — GLUCOSE, CAPILLARY
Glucose-Capillary: 77 mg/dL (ref 70–99)
Glucose-Capillary: 131 mg/dL — ABNORMAL HIGH (ref 70–99)
Glucose-Capillary: 163 mg/dL — ABNORMAL HIGH (ref 70–99)
Glucose-Capillary: 62 mg/dL — ABNORMAL LOW (ref 70–99)
Glucose-Capillary: 100 mg/dL — ABNORMAL HIGH (ref 70–99)

## 2020-10-16 LAB — POCT URINALYSIS DIP (DEVICE)
Glucose, UA: NEGATIVE mg/dL
Ketones, ur: NEGATIVE mg/dL
Leukocytes,Ua: NEGATIVE
Nitrite: NEGATIVE
Protein, ur: 30 mg/dL — AB
Specific Gravity, Urine: >=1.03 (ref 1.005–1.030)
Urobilinogen, UA: 0.2 mg/dL (ref 0.0–1.0)
pH: 6 (ref 5.0–8.0)

## 2020-10-16 LAB — SARS CORONAVIRUS 2 (TAT 6-24 HRS): SARS Coronavirus 2: NEGATIVE

## 2020-10-16 MED ORDER — LIDOCAINE HCL (PF) 1 % IJ SOLN: 30.0000 mL | INTRAMUSCULAR | Status: DC | PRN

## 2020-10-16 MED ORDER — TERBUTALINE SULFATE 1 MG/ML IJ SOLN
0.2500 mg | Freq: Once | INTRAMUSCULAR | Status: AC | PRN
  Administered 2020-10-18: 0.25 mg via SUBCUTANEOUS

## 2020-10-16 MED ORDER — ACETAMINOPHEN 325 MG PO TABS
650.0000 mg | ORAL_TABLET | ORAL | Status: DC | PRN
  Administered 2020-10-17: 650 mg via ORAL
  Filled 2020-10-16: qty 2

## 2020-10-16 MED ORDER — ONDANSETRON HCL 4 MG/2ML IJ SOLN: 4.0000 mg | Freq: Four times a day (QID) | INTRAMUSCULAR | Status: DC | PRN

## 2020-10-16 MED ORDER — OXYTOCIN-SODIUM CHLORIDE 30-0.9 UT/500ML-% IV SOLN: 2.5000 [IU]/h | INTRAVENOUS | Status: DC

## 2020-10-16 MED ORDER — OXYCODONE-ACETAMINOPHEN 5-325 MG PO TABS: 2.0000 | ORAL_TABLET | ORAL | Status: DC | PRN

## 2020-10-16 MED ORDER — OXYCODONE-ACETAMINOPHEN 5-325 MG PO TABS: 1.0000 | ORAL_TABLET | ORAL | Status: DC | PRN

## 2020-10-16 MED ORDER — LACTATED RINGERS IV SOLN
500.0000 mL | INTRAVENOUS | Status: DC | PRN
Start: 2020-10-16 — End: 2020-10-18

## 2020-10-16 MED ORDER — LACTATED RINGERS IV SOLN: INTRAVENOUS | Status: DC

## 2020-10-16 MED ORDER — MISOPROSTOL 25 MCG QUARTER TABLET
25.0000 ug | ORAL_TABLET | ORAL | Status: DC | PRN
  Administered 2020-10-16: 25 ug via VAGINAL
  Filled 2020-10-16: qty 1

## 2020-10-16 MED ORDER — MISOPROSTOL 25 MCG QUARTER TABLET
ORAL_TABLET | ORAL | Status: AC
  Administered 2020-10-16: 25 ug via VAGINAL
  Filled 2020-10-16: qty 1

## 2020-10-16 MED ORDER — SOD CITRATE-CITRIC ACID 500-334 MG/5ML PO SOLN
30.0000 mL | ORAL | Status: DC | PRN
  Administered 2020-10-18: 30 mL via ORAL
  Filled 2020-10-16: qty 15

## 2020-10-16 MED ORDER — ZOLPIDEM TARTRATE 5 MG PO TABS
5.0000 mg | ORAL_TABLET | Freq: Every evening | ORAL | Status: DC | PRN
  Administered 2020-10-16: 5 mg via ORAL
  Filled 2020-10-16: qty 1

## 2020-10-16 MED ORDER — OXYTOCIN BOLUS FROM INFUSION
333.0000 mL | Freq: Once | INTRAVENOUS | Status: DC
Start: 2020-10-16 — End: 2020-10-18

## 2020-10-16 MED ORDER — OXYTOCIN-SODIUM CHLORIDE 30-0.9 UT/500ML-% IV SOLN
1.0000 m[IU]/min | INTRAVENOUS | Status: DC
  Administered 2020-10-17: 2 m[IU]/min via INTRAVENOUS
  Filled 2020-10-16: qty 500

## 2020-10-16 MED ORDER — TERBUTALINE SULFATE 1 MG/ML IJ SOLN
0.2500 mg | Freq: Once | INTRAMUSCULAR | Status: DC | PRN
  Filled 2020-10-16: qty 1

## 2020-10-16 NOTE — Progress Notes (Signed)
   PRENATAL VISIT NOTE  Subjective:  Tina Brown is a 35 y.o. G1P0 at [redacted]w[redacted]d being seen today for ongoing prenatal care.  She is currently monitored for the following issues for this high-risk pregnancy and has Diabetes mellitus with complication ([place]); High-risk pregnancy supervision; Diabetes in pregnancy; Advanced maternal age, 1st pregnancy; Pregnancy resulting from in vitro fertilization in third trimester; Intramural leiomyoma of uterus; Abnormal antenatal AFP screen; Excessive fetal growth affecting management of mother in third trimester, antepartum; and Elevated blood pressure affecting pregnancy in third trimester, antepartum on their problem list.  Patient doing well with no acute concerns today. She reports no complaints.  Contractions: Not present. Vag. Bleeding: Small.  Movement: Present. Denies leaking of fluid.   FBS:72-92 PPBS: 103-130  The following portions of the patient's history were reviewed and updated as appropriate: allergies, current medications, past family history, past medical history, past social history, past surgical history and problem list. Problem list updated.  Objective:   Vitals:   10/16/20 0936 10/16/20 1017  BP: (!) 155/85 (!) 152/83  Pulse: 85   Weight: 174 lb 1.6 oz (79 kg)     Fetal Status: Fetal Heart Rate (bpm): NST Fundal Height: 37 cm Movement: Present     General:  Alert, oriented and cooperative. Patient is in no acute distress.  Skin: Skin is warm and dry. No rash noted.   Cardiovascular: Normal heart rate noted  Respiratory: Normal respiratory effort, no problems with respiration noted  Abdomen: Soft, gravid, appropriate for gestational age.  Pain/Pressure: Present     Pelvic: Cervical exam deferred        Extremities: Normal range of motion.  Edema: None  Mental Status:  Normal mood and affect. Normal behavior. Normal judgment and thought content.   Assessment and Plan:  Pregnancy: G1P0 at [redacted]w[redacted]d  1. Abnormal antenatal AFP  screen   2. Primigravida of advanced maternal age in third trimester   3. Excessive fetal growth affecting management of pregnancy in third trimester, single or unspecified fetus   4. Pregnancy resulting from in vitro fertilization in third trimester   5. Pre-existing type 2 diabetes mellitus during pregnancy in third trimester Good blood sugar control  6. Elevated blood pressure affecting pregnancy in third trimester, antepartum Possible gestational hypertension, currently asymptomatic.  Pt to MAU for further eval and possible delivery.  If pt not kept suggest IOL at 38 wks on 10/23/20  Term labor symptoms and general obstetric precautions including but not limited to vaginal bleeding, contractions, leaking of fluid and fetal movement were reviewed in detail with the patient.  Please refer to After Visit Summary for other counseling recommendations.   No follow-ups on file.   Lynnda Shields, MD Faculty Attending Center for [facility]

## 2020-10-16 NOTE — Progress Notes (Signed)
Inpatient Diabetes Program Recommendations  ADA Standards of Care 2022 Diabetes in Pregnancy Target Glucose Ranges:  Fasting: 60 - 90 mg/dL Preprandial: 60 - 105 mg/dL 1 hr postprandial: Less than 140mg /dL (from first bite of meal) 2 hr postprandial: Less than 120 mg/dL (from first bit of meal)   Lab Results  Component Value Date   GLUCAP 77 10/16/2020   HGBA1C 5.7 (A) 06/22/2020    Review of Glycemic Control  Diabetes history: pre existing DM type 2 Outpatient Diabetes medications:  on admission: Lantus 36 units qhs, metformin 1000 mg bid Prior to pregnancy: Lantus 20 units bid, Glipizide 10 mg bid Current orders for Inpatient glycemic control:  CBG checks Q4 If glucose trends >120 recommend starting IV insulin Will follow glucose trends after delivery for insulin needs  Post partum consider -  Metformin 1000 mg bid - Glycemic control order set ACHS Novolog 0-9 units tid + hs scale CBG checks before the meals.  Thanks,  Tama Headings RN, MSN, BC-ADM Inpatient Diabetes Coordinator Team Pager [phone] (8a-5p)

## 2020-10-16 NOTE — Progress Notes (Signed)

## 2020-10-16 NOTE — Progress Notes (Signed)
Pt's blood glucose 62 @2226 , pt given 8oz of apple juice. RN will reassess blood glucose in 30 minutes

## 2020-10-16 NOTE — MAU Note (Signed)
Sent from clinic with elevated BP, for further eval. Not a new problem.  Denies HA, visual changes, epigastric pain or increase in swelling.  Reports +FM. Denies any pain, bleeding or ROM.

## 2020-10-16 NOTE — H&P (Signed)
Tina Brown is a 35 y.o. female G1P0 with IUP at 13w0dby LMP presenting for IOL due to new onset gestational hypertension.  She reports positive fetal movement. She denies leakage of fluid or vaginal bleeding. She denies HA, blurry vision, floating spots, RUQ pain, sudden overall swelling or facial edema.   She has pregestational diabetes, was on insulin and metformin (10030mBID with meals).   Patient is questionable CHTN; prenatal note says that she was on lisinopril previously but patient denies any blood pressure issues. Review of records shows no elevated BPs, note from 09/03/2017 says that patient was on lisinoipril for renal protection.     Prenatal History/Complications: PNC at Type 2 DM on insulin and metformin Fibroid 6 cm New onset of GHTN Pregnancy complications:  - Past Medical History: Past Medical History:  Diagnosis Date  . [place]  . Foot callus 08/2019  . In vitro fertilization     Past Surgical History: Past Surgical History:  Procedure Laterality Date  . IVF      Obstetrical History: OB History    Gravida  1   Para      Term      Preterm      AB      Living        SAB      IAB      Ectopic      Multiple      Live Births               Social History: Social History   Socioeconomic History  . Marital status: Married    Spouse name: Not on file  . Number of children: Not on file  . Years of education: Not on file  . Highest education level: Not on file  Occupational History  . Not on file  Tobacco Use  . Smoking status: Never Smoker  . Smokeless tobacco: Never Used  Vaping Use  . Vaping Use: Never used  Substance and Sexual Activity  . Alcohol use: No  . Drug use: No  . Sexual activity: Yes    Partners: Male  Other Topics Concern  . Not on file  Social History Narrative  . Not on file   Social Determinants of Health   Financial Resource Strain: Not on file  Food  Insecurity: No Food Insecurity  . Worried About RuCharity fundraisern the Last Year: Never true  . Ran Out of Food in the Last Year: Never true  Transportation Needs: No Transportation Needs  . Lack of Transportation (Medical): No  . Lack of Transportation (Non-Medical): No  Physical Activity: Not on file  Stress: Not on file  Social Connections: Not on file    Family History: Family History  Problem Relation Age of Onset  . Diabetes Mother   . Hypertension Mother   . Diabetes Father   . Hypertension Father   . Diabetes Maternal Grandmother   . Hypertension Maternal Grandmother   . Diabetes Maternal Grandfather   . Hypertension Maternal Grandfather   . Diabetes Paternal Grandmother   . Hypertension Paternal Grandmother   . Diabetes Paternal Grandfather   . Hypertension Paternal Grandfather     Allergies: No Known Allergies  Medications Prior to Admission  Medication Sig Dispense Refill Last Dose  . Accu-Chek Softclix Lancets lancets USE AS INSTRUCTED; PLEASE CHECK BLOOD GLUCOSE 4 TIMES DAILY. (Patient taking differently: USE AS INSTRUCTED; PLEASE CHECK BLOOD GLUCOSE 4 TIMES DAILY.)  100 each 6   . aspirin (EC-81 ASPIRIN) 81 MG EC tablet Take 1 tablet (81 mg total) by mouth daily. Swallow whole. 30 tablet 12   . Blood Glucose Monitoring Suppl (ACCU-CHEK GUIDE ME) w/Device KIT PLEASE CHECK BLOOD GLUCOSE 4 TIMES DAILY. 1 kit 0   . famotidine (PEPCID) 20 MG tablet TAKE 1 TABLET (20 MG TOTAL) BY MOUTH DAILY. 90 tablet 0   . glucose blood test strip USE AS INSTRUCTED; PLEASE CHECK BLOOD GLUCOSE 4 TIMES DAILY. (Patient taking differently: USE AS INSTRUCTED; PLEASE CHECK BLOOD GLUCOSE 4 TIMES DAILY.) 100 strip 6   . insulin glargine (LANTUS) 100 UNIT/ML Solostar Pen INJECT 36 UNITS INTO THE SKIN AT BEDTIME. 3 mL 11   . metFORMIN (GLUCOPHAGE) 1000 MG tablet TAKE 1 TABLET (1,000 MG TOTAL) BY MOUTH 2 (TWO) TIMES DAILY WITH A MEAL. 180 tablet 3   . Prenatal 27-1 MG TABS TAKE 1 TABLET BY  MOUTH DAILY AT 12 NOON. 30 tablet 11     Review of Systems   Constitutional: Negative for fever and chills Eyes: Negative for visual disturbances Respiratory: Negative for shortness of breath, dyspnea Cardiovascular: Negative for chest pain or palpitations  Gastrointestinal: Negative for vomiting, diarrhea and constipation.  POSITIVE for abdominal pain (contractions) Genitourinary: Negative for dysuria and urgency Musculoskeletal: Negative for back pain, joint pain, myalgias  Neurological: Negative for dizziness and headaches  Blood pressure (!) 149/83, pulse 82, temperature 98.3 F (36.8 C), temperature source Oral, resp. rate 16, height _0  (1.753 m), weight 78.9 kg, last menstrual period 01/31/2020, SpO2 98 %. General appearance: alert, cooperative and appears stated age Lungs: normal respiratory effort Heart: regular rate and rhythm Abdomen: soft, non-tender; bowel sounds normal Extremities: Homans sign is negative, no sign of DVT DTR's 2+ Presentation: cephalic Fetal monitoring  Baseline: 145 bpm, mod var, present acel, no decels, no uterine contractions Uterine activity  None     Prenatal labs: ABO, Rh: --/--/PENDING (04/04 1300) Antibody: PENDING (04/04 1300) Rubella: 5.50 (12/13 0941) RPR: Non Reactive (02/03 1035)  HBsAg: Negative (12/13 0941)  HIV: Non Reactive (02/03 1035)  GBS: Negative/-- (03/30 1315)  1 hr Glucola Type 2 DM Genetic screening LR NIPS,  Anatomy US normal unable to visualize  Prenatal Transfer Tool  Maternal Diabetes: Yes:  Diabetes Type:  Pre-pregnancy Genetic Screening: Abnormal:  Results: Elevated AFP Maternal Ultrasounds/Referrals: Normal; was followed DM and Fetal Ultrasounds or other Referrals:  Referred to Materal Fetal Medicine  Maternal Substance Abuse:  No Significant Maternal Medications:  Meds include: Other: insulin and metformin Significant Maternal Lab Results: Other: elevated AFP  Results for orders placed or performed  during the hospital encounter of 10/16/20 (from the past 24 hour(s))  Type and screen [place]   Collection Time: 10/16/20  1:00 PM  Result Value Ref Range   ABO/RH(D) PENDING    Antibody Screen PENDING    Sample Expiration      10/19/2020,2359 Performed at [facility], [place] [address] [place], [place] [address]   Results for orders placed or performed in visit on 10/16/20 (from the past 24 hour(s))  POCT urinalysis dip (device)   Collection Time: 10/16/20 10:08 AM  Result Value Ref Range   Glucose, UA NEGATIVE NEGATIVE mg/dL   Bilirubin Urine SMALL (A) NEGATIVE   Ketones, ur NEGATIVE NEGATIVE mg/dL   Specific Gravity, Urine >=1.030 1.005 - 1.030   Hgb urine dipstick TRACE (A) NEGATIVE   pH 6.0 5.0 - 8.0  Protein, ur 30 (A) NEGATIVE mg/dL   Urobilinogen, UA 0.2 0.0 - 1.0 mg/dL   Nitrite NEGATIVE NEGATIVE   Leukocytes,Ua NEGATIVE NEGATIVE    Assessment: Tina Brown is a 35 y.o. G1P0 with an IUP at 32w0dpresenting for IOL due to new diagnosis of GHTN. She is a type 2 DM on metformin and lantus; 6 cm fibroid. [place] of [place].   Plan: #Labor: expectant management #Pain:  Per request #FWB Cat 1 #ID: GBS: neg #MOF:  breast #MOC: none #Circ: NA -BGM is [phone] will check q 4 hours and then q 2 hours during active labor; will start on carb modified diet and RN will update provider -watch BS and if consistently over 120 will start sliding scale --pre-e labs pending; patient is asymptomatic at this time for pre-e.  -Per RN, patient is 1 cm/thick so will start with cytotec and then FB. Patient does not tolerate vaginal exams so will be judicious with cervical checks.   KMervyn SkeetersKooistra 10/16/2020, 1:27 PM

## 2020-10-16 NOTE — Progress Notes (Signed)
Patient Vitals for the past 4 hrs:  BP Temp Temp src Pulse Resp  10/16/20 2045 (!) 149/74 -- -- 69 17  10/16/20 1914 136/63 97.9 F (36.6 C) Oral 76 17   No c/o.  FHR Cat 1.  Mild irregular ctx  Cx tight 1/50/-2,anterior.  Foley inserted and inflated w/60cc H20.  Cytotec 70mcg placed in post vaginal fornix.  Will start pitocin when foley falls out.

## 2020-10-16 NOTE — Progress Notes (Signed)
Interpreter Gwendolyn Fill present for encounter. Pt denies H/A or visual disturbances. She reports one episode of vaginal bleeding on 3/31 which was bright red and became less overnight. She states that she was told to go to the hospital however she did not go.

## 2020-10-16 NOTE — MAU Provider Note (Signed)
Patient Name: Tina Brown, female   DOB: 02/29/1986, 35 y.o.  MRN: [id number]  Patient seen - sent over from office. Had elevated BP there. Was also seen in MAU on 3/28 and had multiple elevated BPs. Discussed induction with patient. Patient amenable.  Labor team notified.  Truett Mainland, DO

## 2020-10-16 NOTE — Progress Notes (Signed)
   Tina Brown is a 35 y.o. G1P0 at [redacted]w[redacted]d  admitted for induction of labor due to pre-e without severe features. BpS have been mild range; she denies HA, blurry vision, floating spots, RUQ pain.   Subjective: Coping well; no pain.   Objective: Vitals:   10/16/20 1424 10/16/20 1529 10/16/20 1632 10/16/20 1722  BP: (!) 147/83 (!) 151/64 (!) 147/69 138/60  Pulse: 72 73 83 76  Resp:      Temp:      TempSrc:      SpO2:      Weight:      Height:       No intake/output data recorded.  FHT:  FHR: 140 bpm, variability: moderate,  accelerations:  Present,  decelerations:  Absent UC:   none SVE:   Dilation: 1 Effacement (%): Thick Station: -3 Exam by:: Sheryle Hail, RN   Labs: Lab Results  Component Value Date   WBC 6.3 10/16/2020   HGB 13.1 10/16/2020   HCT 39.2 10/16/2020   MCV 87.7 10/16/2020   PLT 254 10/16/2020    Assessment / Plan: Early labor. Patient had sweet potato around 4:45-5, Blood sugar around 5:30 was 163. Will recheck at 1 hour mark. (around 6 pm)  -give next cytotec dose vaginally per protocol, will check patient at 1815 -PCR is 0.34; meets criteria for pre-e without SF. BPs are elevated but none severe; continue to monitor  Labor: early labor Fetal Wellbeing:  Category I Pain Control:  Labor support without medications Anticipated MOD:  NSVD  Starr [place] 10/16/2020, 5:43 PM

## 2020-10-17 ENCOUNTER — Inpatient Hospital Stay (HOSPITAL_COMMUNITY): Payer: Medicaid Other | Admitting: Anesthesiology

## 2020-10-17 ENCOUNTER — Other Ambulatory Visit: Payer: Medicaid Other

## 2020-10-17 ENCOUNTER — Encounter: Payer: Medicaid Other | Admitting: Family Medicine

## 2020-10-17 LAB — CBC
HCT: 35.2 % — ABNORMAL LOW (ref 36.0–46.0)
Hemoglobin: 11.6 g/dL — ABNORMAL LOW (ref 12.0–15.0)
MCH: 29.1 pg (ref 26.0–34.0)
MCHC: 33 g/dL (ref 30.0–36.0)
MCV: 88.2 fL (ref 80.0–100.0)
Platelets: 215 10*3/uL (ref 150–400)
RBC: 3.99 MIL/uL (ref 3.87–5.11)
RDW: 14.4 % (ref 11.5–15.5)
WBC: 5.7 10*3/uL (ref 4.0–10.5)
nRBC: 0 % (ref 0.0–0.2)

## 2020-10-17 LAB — GLUCOSE, CAPILLARY
Glucose-Capillary: 125 mg/dL — ABNORMAL HIGH (ref 70–99)
Glucose-Capillary: 90 mg/dL (ref 70–99)
Glucose-Capillary: 70 mg/dL (ref 70–99)
Glucose-Capillary: 83 mg/dL (ref 70–99)

## 2020-10-17 LAB — RPR: RPR Ser Ql: NONREACTIVE

## 2020-10-17 MED ORDER — BUPIVACAINE HCL (PF) 0.25 % IJ SOLN
INTRAMUSCULAR | Status: DC | PRN
  Administered 2020-10-17 (×2): 5 mL via EPIDURAL

## 2020-10-17 MED ORDER — PHENYLEPHRINE 40 MCG/ML (10ML) SYRINGE FOR IV PUSH (FOR BLOOD PRESSURE SUPPORT): 80.0000 ug | PREFILLED_SYRINGE | INTRAVENOUS | Status: DC | PRN

## 2020-10-17 MED ORDER — LIDOCAINE HCL (PF) 1 % IJ SOLN
INTRAMUSCULAR | Status: DC | PRN
  Administered 2020-10-17 (×2): 5 mL via EPIDURAL

## 2020-10-17 MED ORDER — FENTANYL-BUPIVACAINE-NACL 0.5-0.125-0.9 MG/250ML-% EP SOLN
12.0000 mL/h | EPIDURAL | Status: DC | PRN
  Administered 2020-10-17: 12 mL/h via EPIDURAL
  Filled 2020-10-17 (×2): qty 250

## 2020-10-17 MED ORDER — EPHEDRINE 5 MG/ML INJ: 10.0000 mg | INTRAVENOUS | Status: DC | PRN

## 2020-10-17 MED ORDER — DIPHENHYDRAMINE HCL 50 MG/ML IJ SOLN: 12.5000 mg | INTRAMUSCULAR | Status: DC | PRN

## 2020-10-17 MED ORDER — LACTATED RINGERS IV SOLN
500.0000 mL | Freq: Once | INTRAVENOUS | Status: AC
  Administered 2020-10-18: 500 mL via INTRAVENOUS

## 2020-10-17 MED ORDER — DIPHENHYDRAMINE HCL 50 MG/ML IJ SOLN
25.0000 mg | Freq: Once | INTRAMUSCULAR | Status: AC
  Administered 2020-10-17: 25 mg via INTRAVENOUS
  Filled 2020-10-17: qty 1

## 2020-10-17 NOTE — Anesthesia Procedure Notes (Signed)
Epidural Patient location during procedure: OB Start time: 10/17/2020 2:28 AM End time: 10/17/2020 2:53 AM  Staffing Anesthesiologist: Duane Boston, MD Performed: anesthesiologist   Preanesthetic Checklist Completed: patient identified, IV checked, site marked, risks and benefits discussed, monitors and equipment checked, pre-op evaluation and timeout performed  Epidural Patient position: sitting Prep: DuraPrep Patient monitoring: heart rate, cardiac monitor, continuous pulse ox and blood pressure Approach: midline Location: L2-L3 Injection technique: LOR saline  Needle:  Needle type: Tuohy  Needle gauge: 17 G Needle length: 9 cm Needle insertion depth: 7 cm Catheter size: 20 Guage Catheter at skin depth: 12 cm Test dose: negative and Other  Assessment Events: blood not aspirated, injection not painful, no injection resistance and negative IV test  Additional Notes Informed consent obtained prior to proceeding including risk of failure, 1% risk of PDPH, risk of minor discomfort and bruising.  Discussed rare but serious complications including epidural abscess, permanent nerve injury, epidural hematoma.  Discussed alternatives to epidural analgesia and patient desires to proceed.  Timeout performed pre-procedure verifying patient name, procedure, and platelet count.  Patient tolerated procedure well.

## 2020-10-17 NOTE — Progress Notes (Addendum)
Labor Progress Note Tina Brown is a 35 y.o. G1P0 at [redacted]w[redacted]d presented for induction of labor due to pre-e without severe features.  S: Denies significant pain or feeling significant pressure from contractions.  O:  BP 135/70   Pulse 82   Temp 98.5 F (36.9 C) (Oral)   Resp 16   Ht 5\' 9"  (1.753 m)   Wt 78.9 kg   LMP 01/31/2020 (Exact Date)   SpO2 98%   BMI 25.69 kg/m  EFM: Baseline 150bpm/ Moderate Variability/ + Accels/ 1 prolonged 7 minute decel at 0730, returned to baseline folllowing Toco: q1-2 min  CVE: Dilation: 6 Effacement (%): 90 Station: -1 Presentation: Vertex Exam by:: Sylvester Harder MD   A&P: 35 y.o. G1P0 [redacted]w[redacted]d presented for induction of labor due to pre-e without severe features. #IOL: Progressing well.  S/p cyto x2. FB out @0022 . Pit started @0100 , currently @8mL /hr. AROM @0915 , moderate meconium.  #Pain: PRN #FWB: overall Category 1 #GBS negative #A2GDM: EFW 88%ile. Previously on insulin and glyburide. Not on meds since admit. Last CBG this morning was 94.  Continue to monitor. #Pre-E without SF:  Asymptomatic. P/C 0.34. No severe range BP. #Rh neg: rhogam postpartum  Delora Fuel, MD 9:29 AM  GME ATTESTATION:  I saw and evaluated the patient. I agree with the findings and the plan of care as documented in the resident's note.  Arrie Senate, MD OB Fellow, [place] for [facility] 10/17/2020 9:57 AM

## 2020-10-17 NOTE — Progress Notes (Addendum)
Labor Progress Note Tina Brown is a 35 y.o. G1P0 at [redacted]w[redacted]d presented for induction of labor due topre-e without severe features. S: Has no acute complaints.   O:  BP 133/81   Pulse 98   Temp 98.9 F (37.2 C) (Oral)   Resp 17   Ht 5\' 9"  (1.753 m)   Wt 78.9 kg   LMP 01/31/2020 (Exact Date)   SpO2 98%   BMI 25.69 kg/m  EFM: baseline 150bpm/mod variability/+accels/no decels Toco: q1-2 min  CVE: Dilation: 8 Effacement (%): 80 Station: -1 Presentation: Vertex Exam by:: Sylvester Harder MD   A&P: 35 y.o. G1P0 [redacted]w[redacted]d presented for induction of labor due topre-e without severe features. #IOL: S/p cyto x2. FB out @0022 . Pit started @0100 , currently @16mL /hr. AROM @0915 , moderate meconium. Patient has made cervical change, will continue to titrate pit.  #Pain: PRN #FWB: overall Category 1 #GBS negative #A2GDM: EFW 88%ile. Previously on insulin and glyburide. Not on meds since admit. BGL wnl.  Continue to monitor. #Pre-E without SF:  Asymptomatic. P/C 0.34. No severe range BP. #Rh neg: rhogam postpartum  Abe People, Student-PA 6:13 PM  GME ATTESTATION:  I saw and evaluated the patient. I agree with the findings and the plan of care as documented in the PA student's note.  Arrie Senate, MD OB Fellow, [place] for [place] 10/17/2020 6:28 PM

## 2020-10-17 NOTE — Progress Notes (Signed)
CBG 113 @ 2105

## 2020-10-17 NOTE — Progress Notes (Signed)
Labor Progress Note Tina Brown is a 35 y.o. G1P0 at [redacted]w[redacted]d presented for induction of labor due to pre-e without severe features.  S: Strip reviewed.  O:  BP 139/62   Pulse 76   Temp 98.7 F (37.1 C) (Oral)   Resp 18   Ht 5\' 9"  (1.753 m)   Wt 78.9 kg   LMP 01/31/2020 (Exact Date)   SpO2 98%   BMI 25.69 kg/m  EFM: Baseline 150bpm/ Moderate Variability/ + Accels/no decels Toco: q1-4 min  CVE: Dilation: 6 Effacement (%): 90 Station: -1 Presentation: Vertex Exam by:: Sylvester Harder MD   A&P: 35 y.o. G1P0 [redacted]w[redacted]d presented for induction of labor due to pre-e without severe features. #IOL: S/p cyto x2. FB out @0022 . Pit started @0100 , currently @8mL /hr. AROM @0915 , moderate meconium. Patient is unchanged since last exam, will continue to titrate pit and place IUPC at next check if unchanged. #Pain: PRN #FWB: overall Category 1 #GBS negative #A2GDM: EFW 88%ile. Previously on insulin and glyburide. Not on meds since admit. BGL wnl.  Continue to monitor. #Pre-E without SF:  Asymptomatic. P/C 0.34. No severe range BP. #Rh neg: rhogam postpartum  Arrie Senate, MD 2:19 PM

## 2020-10-17 NOTE — Progress Notes (Signed)
Patient Vitals for the past 4 hrs:  BP Temp Temp src Pulse Resp  10/17/20 0028 136/71 97.8 F (36.6 C) Oral 67 --  10/16/20 2320 (!) 142/84 -- -- 83 17  10/16/20 2230 134/85 -- -- 82 17  10/16/20 2135 134/65 -- -- 70 16   Blood sugar 62 at 2245>up to 100 after apple juice.  FHR Cat 1.  Irregular and mild ctx.  Foley fell out, cx 4/70/-2.  Plan to start Pitocin.

## 2020-10-17 NOTE — Progress Notes (Signed)
Blood Glucose Reading 64 @ 2315, 4oz of Apple Juice given to pt.   Blood Glucose Reading 78 @ 2359

## 2020-10-17 NOTE — Progress Notes (Signed)
Labor Progress Note Tina Brown is a 35 y.o. G1P0 at [redacted]w[redacted]d presented for induction of labor due toPreE without severe features.  S: Pt reports increasing vaginal pressure with contractions. No other concerns at this time. Pt's cousin at bedside.  O:  BP (!) 144/71   Pulse 87   Temp 99 F (37.2 C) (Oral)   Resp 17   Ht 5\' 9"  (1.753 m)   Wt 78.9 kg   LMP 01/31/2020 (Exact Date)   SpO2 98%   BMI 25.69 kg/m  EFM: baseline 160 bpm/mod variability/+accels/no decels Toco: q1-2 min  CVE: Dilation: 8 Effacement (%): 90 Station: 0 Presentation: Vertex Exam by:: Sylvester Harder MD   A&P: 35 y.o. G1P0 [redacted]w[redacted]d presented for induction of labor due toPreE without severe features. #IOL: S/p cyto x2. FB out @0022 . Pit started @0100 , currently @16mL /hr. AROM @0915 , moderate meconium. Given minimal cervical change since last exam, IUPC was placed. Will plan to recheck in 2 hours or sooner as clinically indicated. #Pain: epidural #FWB: Category 1 strip #GBS: negative #A2GDM: EFW 88%ile. Previously on insulin and glyburide. Not on meds since admit. BGL wnl.  Continue to monitor. #Pre-E without SF:  Asymptomatic. P/C 0.34. BP normal to mild range. #Rh neg: rhogam postpartum  Randa Ngo, MD 8:30 PM

## 2020-10-17 NOTE — Progress Notes (Signed)
Patient Vitals for the past 4 hrs:  BP Temp Temp src Pulse Resp  10/17/20 0700 136/66 98.1 F (36.7 C) Oral 69 20  10/17/20 0630 133/68 -- -- 68 18  10/17/20 0600 132/68 -- -- 72 18  10/17/20 0530 (!) 122/58 -- -- 72 18  10/17/20 0500 137/81 -- -- 84 18  10/17/20 0430 135/75 -- -- 89 16   Comfortable w/epidural.  Around 0730, pt sat up to brush her teeth, FHR dropped to 70-80.  Language barrier prevented quick position change, but once pt was turned onto her side, FHR promptly returned to baseline, avg variability, + accels. Pitocin at 8 mu/min, ctx q 2-3 minutes.

## 2020-10-17 NOTE — Progress Notes (Signed)
Labor Progress Note Tina Brown is a 35 y.o. G1P0 at [redacted]w[redacted]d presented for induction of labor due toPreE without severe features.  S: Pt resting comfortably with epidural in place. No concerns at this time.  O:  BP 139/87   Pulse 80   Temp (!) 100.7 F (38.2 C)   Resp 17   Ht 5\' 9"  (1.753 m)   Wt 78.9 kg   LMP 01/31/2020 (Exact Date)   SpO2 98%   BMI 25.69 kg/m  EFM: baseline 140 bpm/mod variability/+accels/no decels Toco: ctx q2-3 min, 150 MVU  CVE: Dilation: 6 Effacement (%): 80,90 Station: Plus 1 Presentation: Vertex Exam by:: Dr Astrid Drafts   A&P: 35 y.o. G1P0 [redacted]w[redacted]d presented for induction of labor due toPreE without severe features. #IOL: S/p cyto x2. FB out @0022 . Pit started @0100 , currently @20mL /hr. AROM @0915 , moderate meconium. IUPC placed at 2018 given minimal cervical change. On most recent check, cervical edema noted with regression of cervical dilation. However, reassuringly, advancement of fetal station. Of note, fetal presentation is slight right OP by bedside ultrasound. Will continue up-titration of pitocin as clinically indicated with maternal repositioning and plan to recheck in several hours.  #Intrapartum Fever: maternal temperature to 100.15F at 2222; reassuringly no increase in fetal baseline. Will administer tylenol and have a low threshold to start empiric antibiotics for chorioamnionitis if recurrent maternal fever or change in fetal baseline. #Pain: epidural in place #FWB: Category 1 strip #GBS: negative #A2GDM: EFW 88%ile. Previously on insulin and glyburide. Not on meds since admit. BGL wnl.  Continue to monitor. #Pre-E without SF:  Asymptomatic. P/C 0.34. BP normal to mild range. #Rh neg: rhogam postpartum  Randa Ngo, MD 11:14 PM

## 2020-10-17 NOTE — Anesthesia Preprocedure Evaluation (Addendum)
Anesthesia Evaluation  Patient identified by MRN, date of birth, ID band Patient awake    Reviewed: Allergy & Precautions, NPO status , Patient's Chart, lab work & pertinent test results  Airway Mallampati: II  TM Distance: >3 FB Neck ROM: Full    Dental no notable dental hx.    Pulmonary neg pulmonary ROS,    Pulmonary exam normal        Cardiovascular hypertension, Normal cardiovascular exam     Neuro/Psych negative neurological ROS  negative psych ROS   GI/Hepatic negative GI ROS, Neg liver ROS,   Endo/Other  diabetes  Renal/GU negative Renal ROS  negative genitourinary   Musculoskeletal negative musculoskeletal ROS (+)   Abdominal   Peds negative pediatric ROS (+)  Hematology negative hematology ROS (+)   Anesthesia Other Findings   Reproductive/Obstetrics (+) Pregnancy                             Anesthesia Physical Anesthesia Plan  ASA: III  Anesthesia Plan: Epidural   Post-op Pain Management:    Induction:   PONV Risk Score and Plan:   Airway Management Planned: Natural Airway  Additional Equipment:   Intra-op Plan:   Post-operative Plan:   Informed Consent: I have reviewed the patients History and Physical, chart, labs and discussed the procedure including the risks, benefits and alternatives for the proposed anesthesia with the patient or authorized representative who has indicated his/her understanding and acceptance.     Dental advisory given and Interpreter used for interveiw  Plan Discussed with: Anesthesiologist  Anesthesia Plan Comments:        Anesthesia Quick Evaluation

## 2020-10-18 ENCOUNTER — Ambulatory Visit: Payer: Medicaid Other

## 2020-10-18 ENCOUNTER — Encounter (HOSPITAL_COMMUNITY)
Admission: AD | Disposition: A | Discharge status: Home / Self Care | Payer: Self-pay | Source: Home / Self Care | Attending: Obstetrics and Gynecology

## 2020-10-18 ENCOUNTER — Encounter (HOSPITAL_COMMUNITY): Payer: Self-pay | Admitting: Student

## 2020-10-18 DIAGNOSIS — E119 Type 2 diabetes mellitus without complications: Secondary | ICD-10-CM

## 2020-10-18 DIAGNOSIS — O09813 Supervision of pregnancy resulting from assisted reproductive technology, third trimester: Secondary | ICD-10-CM

## 2020-10-18 DIAGNOSIS — Z98891 History of uterine scar from previous surgery: Secondary | ICD-10-CM

## 2020-10-18 DIAGNOSIS — O411230 Chorioamnionitis, third trimester, not applicable or unspecified: Secondary | ICD-10-CM

## 2020-10-18 DIAGNOSIS — O2412 Pre-existing diabetes mellitus, type 2, in childbirth: Secondary | ICD-10-CM

## 2020-10-18 DIAGNOSIS — O1404 Mild to moderate pre-eclampsia, complicating childbirth: Secondary | ICD-10-CM

## 2020-10-18 DIAGNOSIS — O360130 Maternal care for anti-D [Rh] antibodies, third trimester, not applicable or unspecified: Secondary | ICD-10-CM

## 2020-10-18 DIAGNOSIS — Z3A37 37 weeks gestation of pregnancy: Secondary | ICD-10-CM

## 2020-10-18 LAB — CBC
HCT: 30.8 % — ABNORMAL LOW (ref 36.0–46.0)
Hemoglobin: 10.2 g/dL — ABNORMAL LOW (ref 12.0–15.0)
MCH: 29 pg (ref 26.0–34.0)
MCHC: 33.1 g/dL (ref 30.0–36.0)
MCV: 87.5 fL (ref 80.0–100.0)
Platelets: 161 10*3/uL (ref 150–400)
RBC: 3.52 MIL/uL — ABNORMAL LOW (ref 3.87–5.11)
RDW: 14.5 % (ref 11.5–15.5)
WBC: 11.9 10*3/uL — ABNORMAL HIGH (ref 4.0–10.5)
nRBC: 0 % (ref 0.0–0.2)

## 2020-10-18 LAB — COMPREHENSIVE METABOLIC PANEL
ALT: 37 U/L (ref 0–44)
AST: 40 U/L (ref 15–41)
Albumin: 2 g/dL — ABNORMAL LOW (ref 3.5–5.0)
Alkaline Phosphatase: 137 U/L — ABNORMAL HIGH (ref 38–126)
Anion gap: 4 — ABNORMAL LOW (ref 5–15)
BUN: 7 mg/dL (ref 6–20)
CO2: 23 mmol/L (ref 22–32)
Calcium: 7.7 mg/dL — ABNORMAL LOW (ref 8.9–10.3)
Chloride: 109 mmol/L (ref 98–111)
Creatinine, Ser: 0.67 mg/dL (ref 0.44–1.00)
GFR, Estimated: >60 mL/min (ref >60)
Glucose, Bld: 204 mg/dL — ABNORMAL HIGH (ref 70–99)
Potassium: 4.2 mmol/L (ref 3.5–5.1)
Sodium: 136 mmol/L (ref 135–145)
Total Bilirubin: 0.5 mg/dL (ref 0.3–1.2)
Total Protein: 4.9 g/dL — ABNORMAL LOW (ref 6.5–8.1)

## 2020-10-18 LAB — GLUCOSE, CAPILLARY
Glucose-Capillary: 102 mg/dL — ABNORMAL HIGH (ref 70–99)
Glucose-Capillary: 109 mg/dL — ABNORMAL HIGH (ref 70–99)
Glucose-Capillary: 113 mg/dL — ABNORMAL HIGH (ref 70–99)
Glucose-Capillary: 113 mg/dL — ABNORMAL HIGH (ref 70–99)
Glucose-Capillary: 116 mg/dL — ABNORMAL HIGH (ref 70–99)
Glucose-Capillary: 125 mg/dL — ABNORMAL HIGH (ref 70–99)
Glucose-Capillary: 147 mg/dL — ABNORMAL HIGH (ref 70–99)
Glucose-Capillary: 177 mg/dL — ABNORMAL HIGH (ref 70–99)
Glucose-Capillary: 64 mg/dL — ABNORMAL LOW (ref 70–99)
Glucose-Capillary: 67 mg/dL — ABNORMAL LOW (ref 70–99)
Glucose-Capillary: 73 mg/dL (ref 70–99)
Glucose-Capillary: 77 mg/dL (ref 70–99)
Glucose-Capillary: 78 mg/dL (ref 70–99)

## 2020-10-18 LAB — ABO/RH
ABO/RH(D): O NEG
Weak D: POSITIVE

## 2020-10-18 SURGERY — Surgical Case
Anesthesia: Epidural | Wound class: Clean Contaminated

## 2020-10-18 MED ORDER — SOD CITRATE-CITRIC ACID 500-334 MG/5ML PO SOLN: 30.0000 mL | ORAL | Status: DC

## 2020-10-18 MED ORDER — ONDANSETRON HCL 4 MG/2ML IJ SOLN: 4.0000 mg | Freq: Once | INTRAMUSCULAR | Status: DC | PRN

## 2020-10-18 MED ORDER — FENTANYL CITRATE (PF) 100 MCG/2ML IJ SOLN
INTRAMUSCULAR | Status: AC
  Filled 2020-10-18: qty 2

## 2020-10-18 MED ORDER — IBUPROFEN 600 MG PO TABS
600.0000 mg | ORAL_TABLET | Freq: Four times a day (QID) | ORAL | Status: DC
Start: 2020-10-18 — End: 2020-10-20
  Administered 2020-10-18 – 2020-10-20 (×8): 600 mg via ORAL
  Filled 2020-10-18 (×8): qty 1

## 2020-10-18 MED ORDER — NALBUPHINE HCL 10 MG/ML IJ SOLN: 5.0000 mg | Freq: Once | INTRAMUSCULAR | Status: DC | PRN

## 2020-10-18 MED ORDER — ONDANSETRON HCL 4 MG/2ML IJ SOLN
INTRAMUSCULAR | Status: DC | PRN
  Administered 2020-10-18: 4 mg via INTRAVENOUS

## 2020-10-18 MED ORDER — ACETAMINOPHEN 325 MG PO TABS
650.0000 mg | ORAL_TABLET | ORAL | Status: DC | PRN
  Administered 2020-10-18 – 2020-10-20 (×4): 650 mg via ORAL
  Filled 2020-10-18 (×5): qty 2

## 2020-10-18 MED ORDER — CEFAZOLIN SODIUM-DEXTROSE 2-4 GM/100ML-% IV SOLN
INTRAVENOUS | Status: AC
  Filled 2020-10-18: qty 100

## 2020-10-18 MED ORDER — LACTATED RINGERS IV SOLN: INTRAVENOUS | Status: DC

## 2020-10-18 MED ORDER — KETOROLAC TROMETHAMINE 30 MG/ML IJ SOLN
INTRAMUSCULAR | Status: AC
  Filled 2020-10-18: qty 1

## 2020-10-18 MED ORDER — INSULIN ASPART 100 UNIT/ML ~~LOC~~ SOLN: 0.0000 [IU] | Freq: Every day | SUBCUTANEOUS | Status: DC

## 2020-10-18 MED ORDER — NALBUPHINE HCL 10 MG/ML IJ SOLN: 5.0000 mg | INTRAMUSCULAR | Status: DC | PRN

## 2020-10-18 MED ORDER — ONDANSETRON HCL 4 MG/2ML IJ SOLN
INTRAMUSCULAR | Status: AC
  Filled 2020-10-18: qty 2

## 2020-10-18 MED ORDER — CEFAZOLIN SODIUM-DEXTROSE 2-3 GM-%(50ML) IV SOLR
INTRAVENOUS | Status: DC | PRN
  Administered 2020-10-18: 2 g via INTRAVENOUS

## 2020-10-18 MED ORDER — OXYTOCIN-SODIUM CHLORIDE 30-0.9 UT/500ML-% IV SOLN
INTRAVENOUS | Status: DC | PRN
  Administered 2020-10-18: 400 mL via INTRAVENOUS

## 2020-10-18 MED ORDER — COCONUT OIL OIL: 1.0000 "application " | TOPICAL_OIL | Status: DC | PRN

## 2020-10-18 MED ORDER — OXYCODONE HCL 5 MG PO TABS: 5.0000 mg | ORAL_TABLET | Freq: Once | ORAL | Status: DC | PRN

## 2020-10-18 MED ORDER — DIPHENHYDRAMINE HCL 25 MG PO CAPS
25.0000 mg | ORAL_CAPSULE | Freq: Four times a day (QID) | ORAL | Status: DC | PRN
Start: 2020-10-18 — End: 2020-10-20

## 2020-10-18 MED ORDER — ONDANSETRON HCL 4 MG/2ML IJ SOLN: 4.0000 mg | Freq: Three times a day (TID) | INTRAMUSCULAR | Status: DC | PRN

## 2020-10-18 MED ORDER — KETOROLAC TROMETHAMINE 30 MG/ML IJ SOLN
30.0000 mg | Freq: Once | INTRAMUSCULAR | Status: AC | PRN
  Administered 2020-10-18: 30 mg via INTRAVENOUS

## 2020-10-18 MED ORDER — TETANUS-DIPHTH-ACELL PERTUSSIS 5-2.5-18.5 LF-MCG/0.5 IM SUSY: 0.5000 mL | PREFILLED_SYRINGE | Freq: Once | INTRAMUSCULAR | Status: DC

## 2020-10-18 MED ORDER — INSULIN GLARGINE 100 UNIT/ML ~~LOC~~ SOLN: 10.0000 [IU] | Freq: Every day | SUBCUTANEOUS | Status: DC

## 2020-10-18 MED ORDER — MENTHOL 3 MG MT LOZG: 1.0000 | LOZENGE | OROMUCOSAL | Status: DC | PRN

## 2020-10-18 MED ORDER — METOCLOPRAMIDE HCL 5 MG/ML IJ SOLN
INTRAMUSCULAR | Status: DC | PRN
  Administered 2020-10-18: 10 mg via INTRAVENOUS

## 2020-10-18 MED ORDER — IBUPROFEN 600 MG PO TABS: 600.0000 mg | ORAL_TABLET | Freq: Four times a day (QID) | ORAL | Status: DC | PRN

## 2020-10-18 MED ORDER — NALOXONE HCL 0.4 MG/ML IJ SOLN: 0.4000 mg | INTRAMUSCULAR | Status: DC | PRN

## 2020-10-18 MED ORDER — METOCLOPRAMIDE HCL 5 MG/ML IJ SOLN
INTRAMUSCULAR | Status: AC
  Filled 2020-10-18: qty 2

## 2020-10-18 MED ORDER — DIPHENHYDRAMINE HCL 25 MG PO CAPS: 25.0000 mg | ORAL_CAPSULE | ORAL | Status: DC | PRN

## 2020-10-18 MED ORDER — NALOXONE HCL 4 MG/10ML IJ SOLN
1.0000 ug/kg/h | INTRAVENOUS | Status: DC | PRN
  Filled 2020-10-18: qty 5

## 2020-10-18 MED ORDER — ENOXAPARIN SODIUM 40 MG/0.4ML ~~LOC~~ SOLN
40.0000 mg | SUBCUTANEOUS | Status: DC
  Administered 2020-10-19 – 2020-10-20 (×2): 40 mg via SUBCUTANEOUS
  Filled 2020-10-18 (×2): qty 0.4

## 2020-10-18 MED ORDER — PRENATAL MULTIVITAMIN CH
1.0000 | ORAL_TABLET | Freq: Every day | ORAL | Status: DC
  Administered 2020-10-19 – 2020-10-20 (×2): 1 via ORAL
  Filled 2020-10-18 (×2): qty 1

## 2020-10-18 MED ORDER — OXYTOCIN-SODIUM CHLORIDE 30-0.9 UT/500ML-% IV SOLN
INTRAVENOUS | Status: AC
  Filled 2020-10-18: qty 500

## 2020-10-18 MED ORDER — SIMETHICONE 80 MG PO CHEW
80.0000 mg | CHEWABLE_TABLET | Freq: Three times a day (TID) | ORAL | Status: DC
  Administered 2020-10-19 – 2020-10-20 (×4): 80 mg via ORAL
  Filled 2020-10-18 (×4): qty 1

## 2020-10-18 MED ORDER — SODIUM CHLORIDE 0.9 % IR SOLN
Status: DC | PRN
Start: 2020-10-18 — End: 2020-10-18
  Administered 2020-10-18: 1000 mL

## 2020-10-18 MED ORDER — OXYTOCIN-SODIUM CHLORIDE 30-0.9 UT/500ML-% IV SOLN
2.5000 [IU]/h | INTRAVENOUS | Status: AC
  Administered 2020-10-18: 2.5 [IU]/h via INTRAVENOUS
  Filled 2020-10-18: qty 500

## 2020-10-18 MED ORDER — TERBUTALINE SULFATE 1 MG/ML IJ SOLN
INTRAMUSCULAR | Status: AC
  Filled 2020-10-18: qty 1

## 2020-10-18 MED ORDER — ZOLPIDEM TARTRATE 5 MG PO TABS: 5.0000 mg | ORAL_TABLET | Freq: Every evening | ORAL | Status: DC | PRN

## 2020-10-18 MED ORDER — FENTANYL CITRATE (PF) 100 MCG/2ML IJ SOLN
INTRAMUSCULAR | Status: DC | PRN
  Administered 2020-10-18: 100 ug via EPIDURAL

## 2020-10-18 MED ORDER — WITCH HAZEL-GLYCERIN EX PADS: 1.0000 "application " | MEDICATED_PAD | CUTANEOUS | Status: DC | PRN

## 2020-10-18 MED ORDER — MORPHINE SULFATE (PF) 0.5 MG/ML IJ SOLN
INTRAMUSCULAR | Status: AC
  Filled 2020-10-18: qty 10

## 2020-10-18 MED ORDER — SIMETHICONE 80 MG PO CHEW: 80.0000 mg | CHEWABLE_TABLET | ORAL | Status: DC | PRN

## 2020-10-18 MED ORDER — CEFAZOLIN SODIUM-DEXTROSE 2-4 GM/100ML-% IV SOLN: 2.0000 g | INTRAVENOUS | Status: DC

## 2020-10-18 MED ORDER — INSULIN GLARGINE 100 UNIT/ML ~~LOC~~ SOLN
10.0000 [IU] | Freq: Every day | SUBCUTANEOUS | Status: DC
  Administered 2020-10-18 – 2020-10-19 (×2): 10 [IU] via SUBCUTANEOUS
  Filled 2020-10-18 (×3): qty 0.1

## 2020-10-18 MED ORDER — SENNOSIDES-DOCUSATE SODIUM 8.6-50 MG PO TABS: 2.0000 | ORAL_TABLET | ORAL | Status: DC

## 2020-10-18 MED ORDER — OXYCODONE HCL 5 MG/5ML PO SOLN: 5.0000 mg | Freq: Once | ORAL | Status: DC | PRN

## 2020-10-18 MED ORDER — SCOPOLAMINE 1 MG/3DAYS TD PT72
MEDICATED_PATCH | TRANSDERMAL | Status: AC
  Filled 2020-10-18: qty 1

## 2020-10-18 MED ORDER — SCOPOLAMINE 1 MG/3DAYS TD PT72
1.0000 | MEDICATED_PATCH | Freq: Once | TRANSDERMAL | Status: DC
  Administered 2020-10-18: 1.5 mg via TRANSDERMAL

## 2020-10-18 MED ORDER — DIBUCAINE (PERIANAL) 1 % EX OINT: 1.0000 "application " | TOPICAL_OINTMENT | CUTANEOUS | Status: DC | PRN

## 2020-10-18 MED ORDER — SODIUM CHLORIDE 0.9% FLUSH: 3.0000 mL | INTRAVENOUS | Status: DC | PRN

## 2020-10-18 MED ORDER — INSULIN ASPART 100 UNIT/ML ~~LOC~~ SOLN: 0.0000 [IU] | Freq: Three times a day (TID) | SUBCUTANEOUS | Status: DC

## 2020-10-18 MED ORDER — SODIUM CHLORIDE 0.9 % IV SOLN
500.0000 mg | INTRAVENOUS | Status: AC
  Administered 2020-10-18: 500 mg via INTRAVENOUS

## 2020-10-18 MED ORDER — HYDROMORPHONE HCL 1 MG/ML IJ SOLN: 0.2500 mg | INTRAMUSCULAR | Status: DC | PRN

## 2020-10-18 MED ORDER — MORPHINE SULFATE (PF) 0.5 MG/ML IJ SOLN
INTRAMUSCULAR | Status: DC | PRN
  Administered 2020-10-18: 3 mg via EPIDURAL

## 2020-10-18 MED ORDER — LACTATED RINGERS IV SOLN: INTRAVENOUS | Status: DC | PRN

## 2020-10-18 MED ORDER — DEXAMETHASONE SODIUM PHOSPHATE 4 MG/ML IJ SOLN
INTRAMUSCULAR | Status: DC | PRN
  Administered 2020-10-18: 8 mg via INTRAVENOUS

## 2020-10-18 MED ORDER — METFORMIN HCL 500 MG PO TABS
1000.0000 mg | ORAL_TABLET | Freq: Two times a day (BID) | ORAL | Status: DC
  Administered 2020-10-18 – 2020-10-20 (×4): 1000 mg via ORAL
  Filled 2020-10-18 (×4): qty 2

## 2020-10-18 MED ORDER — DEXAMETHASONE SODIUM PHOSPHATE 4 MG/ML IJ SOLN
INTRAMUSCULAR | Status: AC
  Filled 2020-10-18: qty 1

## 2020-10-18 MED ORDER — OXYCODONE HCL 5 MG PO TABS
5.0000 mg | ORAL_TABLET | ORAL | Status: DC | PRN
  Administered 2020-10-19 (×3): 5 mg via ORAL
  Administered 2020-10-20 (×2): 10 mg via ORAL
  Filled 2020-10-18: qty 1
  Filled 2020-10-18 (×2): qty 2
  Filled 2020-10-18 (×2): qty 1

## 2020-10-18 MED ORDER — DIPHENHYDRAMINE HCL 50 MG/ML IJ SOLN: 12.5000 mg | INTRAMUSCULAR | Status: DC | PRN

## 2020-10-18 MED ORDER — LIDOCAINE-EPINEPHRINE (PF) 2 %-1:200000 IJ SOLN
INTRAMUSCULAR | Status: DC | PRN
  Administered 2020-10-18: 3 mL via EPIDURAL
  Administered 2020-10-18: 5 mL via EPIDURAL
  Administered 2020-10-18: 2 mL via EPIDURAL

## 2020-10-18 MED ORDER — STERILE WATER FOR IRRIGATION IR SOLN
Status: DC | PRN
  Administered 2020-10-18: 1000 mL

## 2020-10-18 SURGICAL SUPPLY — 31 items
BENZOIN TINCTURE PRP APPL 2/3 (GAUZE/BANDAGES/DRESSINGS) ×2 IMPLANT
CHLORAPREP W/TINT 26ML (MISCELLANEOUS) ×2 IMPLANT
CLAMP CORD UMBIL (MISCELLANEOUS) IMPLANT
CLOTH BEACON ORANGE TIMEOUT ST (SAFETY) ×2 IMPLANT
DRSG OPSITE POSTOP 4X10 (GAUZE/BANDAGES/DRESSINGS) ×2 IMPLANT
ELECT REM PT RETURN 9FT ADLT (ELECTROSURGICAL) ×2
ELECTRODE REM PT RTRN 9FT ADLT (ELECTROSURGICAL) ×1 IMPLANT
EXTRACTOR VACUUM M CUP 4 TUBE (SUCTIONS) IMPLANT
GLOVE BIOGEL PI IND STRL 7.0 (GLOVE) ×2 IMPLANT
GLOVE BIOGEL PI IND STRL 7.5 (GLOVE) ×2 IMPLANT
GLOVE BIOGEL PI INDICATOR 7.0 (GLOVE) ×2
GLOVE BIOGEL PI INDICATOR 7.5 (GLOVE) ×2
GLOVE ECLIPSE 7.5 STRL STRAW (GLOVE) ×2 IMPLANT
GOWN STRL REUS W/TWL LRG LVL3 (GOWN DISPOSABLE) ×6 IMPLANT
KIT ABG SYR 3ML LUER SLIP (SYRINGE) IMPLANT
NEEDLE HYPO 25X5/8 SAFETYGLIDE (NEEDLE) IMPLANT
NS IRRIG 1000ML POUR BTL (IV SOLUTION) ×2 IMPLANT
PACK C SECTION WH (CUSTOM PROCEDURE TRAY) ×2 IMPLANT
PAD OB MATERNITY 4.3X12.25 (PERSONAL CARE ITEMS) ×2 IMPLANT
PENCIL SMOKE EVAC W/HOLSTER (ELECTROSURGICAL) ×2 IMPLANT
RTRCTR C-SECT PINK 25CM LRG (MISCELLANEOUS) ×2 IMPLANT
STRIP CLOSURE SKIN 1/2X4 (GAUZE/BANDAGES/DRESSINGS) ×2 IMPLANT
SUT VIC AB 0 CT1 36 (SUTURE) ×2 IMPLANT
SUT VIC AB 0 CTX 36 (SUTURE) ×4
SUT VIC AB 0 CTX36XBRD ANBCTRL (SUTURE) ×2 IMPLANT
SUT VIC AB 2-0 CT1 27 (SUTURE) ×2
SUT VIC AB 2-0 CT1 TAPERPNT 27 (SUTURE) ×1 IMPLANT
SUT VIC AB 4-0 KS 27 (SUTURE) ×2 IMPLANT
TOWEL OR 17X24 6PK STRL BLUE (TOWEL DISPOSABLE) ×2 IMPLANT
TRAY FOLEY W/BAG SLVR 14FR LF (SET/KITS/TRAYS/PACK) ×2 IMPLANT
WATER STERILE IRR 1000ML POUR (IV SOLUTION) ×2 IMPLANT

## 2020-10-18 NOTE — Transfer of Care (Signed)
Immediate Anesthesia Transfer of Care Note  Patient: Tina Brown  Procedure(s) Performed: CESAREAN SECTION (N/A )  Patient Location: PACU  Anesthesia Type:Epidural  Level of Consciousness: awake, alert  and oriented  Airway & Oxygen Therapy: Patient Spontanous Breathing  Post-op Assessment: Report given to RN and Post -op Vital signs reviewed and stable  Post vital signs: Reviewed and stable  Last Vitals:  Vitals Value Taken Time  BP 132/79 10/18/20 1042  Temp    Pulse 113 10/18/20 1044  Resp 21 10/18/20 1044  SpO2 99 % 10/18/20 1044  Vitals shown include unvalidated device data.  Last Pain:  Vitals:   10/18/20 0737  TempSrc: Oral  PainSc:       Patients Stated Pain Goal: 0 (97/67/34 1937)  Complications: No complications documented.

## 2020-10-18 NOTE — Lactation Note (Signed)
This note was copied from a baby's chart. Lactation Consultation Note  Patient Name: Tina Brown GHWEX'H Date: 10/18/2020 Reason for consult: Initial assessment;Primapara;1st time breastfeeding;Early term 37-38.6wks;Other (Comment) (low blood sugars - 38 - was given formula x 1/ [employer] - [place] Manuela Schwartz # [phone]) Age:37 hours, P 1  LC attempted to latch after hand expressing drops on the nipple - few sucks ,  Baby did not sustain the latch or depth.  MBURN aware mom will need shells with a bra and use the hand pump to pre - pump to stretch the nipple / areola complex and prime the milk ducts.  LC reassured mom with latching and doing these steps the latch should improve.  Per mom + breast changes with pregnancy.    Maternal Data Has patient been taught Hand Expression?: Yes  Feeding Mother's Current Feeding Choice: Breast Milk and Formula  LATCH Score Latch: Repeated attempts needed to sustain latch, nipple held in mouth throughout feeding, stimulation needed to elicit sucking reflex.  Audible Swallowing: None  Type of Nipple: Inverted (semi inverted . semi compressible areola)  Comfort (Breast/Nipple): Soft / non-tender  Hold (Positioning): Assistance needed to correctly position infant at breast and maintain latch.  LATCH Score: 4   Lactation Tools Discussed/Used    Interventions Interventions: Breast feeding basics reviewed;Assisted with latch;Skin to skin;Breast massage;Hand express;Reverse pressure;Breast compression;Adjust position;Shells;Hand pump;Education  Discharge Pump:  (per mom does not have a DEBP at home .Marland Kitchen now has a hand pump) WIC Program: No  Consult Status Consult Status: Follow-up Date: 10/18/20 Follow-up type: In-patient    Wall Lane 10/18/2020, 3:25 PM

## 2020-10-18 NOTE — Op Note (Signed)
Operative Note   SURGERY DATE: 10/18/2020  PRE-OP DIAGNOSIS:  *Pregnancy at [redacted]w[redacted]d * Arrest of Dilation  * Non-Reassuring Fetal Heart Tones   POST-OP DIAGNOSIS: same   PROCEDURE: primary low transverse cesarean section via pfannenstiel skin incision with double layer uterine closure  SURGEON: Surgeon(s) and Role:    * Wouk, Ailene Rud, MD - Primary    * Kassy Mcenroe, Placido Sou, MD - Fellow  ANESTHESIA: epidural  ESTIMATED BLOOD LOSS: 555  DRAINS: 1744mL UOP via indwelling foley  TOTAL IV FLUIDS: 2020mL crystalloid  VTE PROPHYLAXIS: SCDs to bilateral lower extremities  ANTIBIOTICS: Two grams of Cefazolin, 500 mg azithromycin were given., within 1 hour of skin incision  SPECIMENS: placenta to pathology  COMPLICATIONS: none  INDICATIONS: Arrest of dilation at 7cm, failed induction, nonreassuring fetal assessment  FINDINGS: No intra-abdominal adhesions were noted. Grossly normal uterus, tubes and ovaries. Thick meconium amniotic fluid, cephalic female infant, weight pending, APGARs 8/9, intact placenta.  PROCEDURE IN DETAIL: The patient was taken to the operating room where anesthesia was administered and normal fetal heart tones were confirmed. She was then prepped and draped in the normal fashion in the dorsal supine position with a leftward tilt.  After a time out was performed, a pfannensteil skin incision was made with the scalpel and carried through to the underlying layer of fascia. The fascia was then incised at the midline and this incision was extended laterally bluntly and with the mayo scissors. Attention was turned to the superior aspect of the fascial incision which was grasped with the kocher clamps x 2, tented up and the rectus muscles were dissected off with the mayo scissors. In a similar fashion the inferior aspect of the fascial incision was grasped with the kocher clamps, tented up and the rectus muscles dissected off with the mayo scissors. The rectus muscles were then  separated in the midline and the peritoneum was entered bluntly. The Alexis retractor was inserted.   A low transverse hysterotomy was made with the scalpel until the endometrial cavity was breached and the amniotic sac ruptured, yielding meconium stained amniotic fluid. This incision was extended bluntly and the infant's head, shoulders and body were delivered atraumatically.The cord was clamped x 2 and cut, and the infant was handed to the awaiting pediatricians, after delayed cord clamping was done.  The placenta was then gradually expressed from the uterus and then the uterus was cleared of all clots and debris. The hysterotomy was repaired with a running suture of 1-0 vicryl. A second imbricating layer of 1-0 vicryl suture was then placed.     The hysterotomy and all operative sites were reinspected and excellent hemostasis was noted.  The peritoneum was closed with a running stitch of 3-0 Vicryl. The fascia was reapproximated with 0 Vicryl in a simple running fashion bilaterally. The skin was then closed with 4-0 vicryl, in a subcuticular fashion.  The patient  tolerated the procedure well. Sponge, lap, needle, and instrument counts were correct x 2. The patient was transferred to the recovery room awake, alert and breathing independently in stable condition.   Sharene Skeans, MD [facility] Family Medicine Fellow, [facility] for [facility], [place]

## 2020-10-18 NOTE — Progress Notes (Signed)
Pt was drowsy and diaphoretic stated that "she felt her blood sugar was low", capillary blood glucose = 177.  Informed pt that she may be feeling this way due to hormonal changes and the medications that have been given to her, pt acknowledged.

## 2020-10-18 NOTE — Anesthesia Postprocedure Evaluation (Signed)
Anesthesia Post Note  Patient: Tina Brown  Procedure(s) Performed: CESAREAN SECTION (N/A )     Patient location during evaluation: Mother Baby Anesthesia Type: Epidural Level of consciousness: oriented and awake and alert Pain management: pain level controlled Vital Signs Assessment: post-procedure vital signs reviewed and stable Respiratory status: spontaneous breathing and respiratory function stable Cardiovascular status: blood pressure returned to baseline and stable Postop Assessment: no headache, no backache, no apparent nausea or vomiting and able to ambulate Anesthetic complications: no   No complications documented.  Last Vitals:  Vitals:   10/18/20 1413 10/18/20 1506  BP:    Pulse:    Resp: 18 19  Temp:    SpO2: 96% 99%    Last Pain:  Vitals:   10/18/20 1307  TempSrc: Oral  PainSc:    Pain Goal: Patients Stated Pain Goal: 0 (10/16/20 2343)                 Barnet Glasgow

## 2020-10-18 NOTE — Discharge Summary (Addendum)
Postpartum Discharge Summary    Patient Name: Tina Brown DOB: May 29, 1986 MRN: [id number]  Date of admission: 10/16/2020 Delivery date:10/18/2020  Delivering provider: Laurey Arrow BEDFORD  Date of discharge: 10/20/2020  Admitting diagnosis: Gestational hypertension affecting first pregnancy [O13.9] Intrauterine pregnancy: [redacted]w[redacted]d    Secondary diagnosis:  Active Problems:   Diabetes in pregnancy   Advanced maternal age, 1st pregnancy   Intramural leiomyoma of uterus   Gestational hypertension affecting first pregnancy   Pre-eclampsia, third trimester   Cesarean delivery delivered  Additional problems: hypoglycemic event 10/20/20 am, resolved easily with juice   Discharge diagnosis: Term Pregnancy Delivered, Gestational Hypertension, Preeclampsia (mild) and Type 2 DM                                              Post partum procedures:None Augmentation: AROM, Pitocin, Cytotec and IP Foley Complications: None  Hospital course: Induction of Labor With Cesarean Section   35y.o. yo G1P0 at 346w2das admitted to the hospital 10/16/2020 for induction of labor. Patient received cytotec, FB, AROM, pitocin. She was ruptured for over 24 hours. She was on maximum pitocin and unable to achieve adequate attractions. Patient arrested at 7cm dilation.The patient went for cesarean section due to Arrest of Dilation.Pathology revealed acute chorioamnionitis and Funisitis. Delivery details are as follows: Membrane Rupture Time/Date: 9:14 AM ,10/17/2020   Delivery Method:C-Section, Low Transverse  Details of operation can be found in separate operative Note.  Patient had a normal postpartum course. She was started on metformin post-operatively. Her blood pressures were normal, with one elevation but otherwise normal. She is ambulating, tolerating a regular diet, passing flatus, and urinating well.  Patient is discharged home in stable condition on 10/20/20.      Newborn Data: Birth date:10/18/2020  Birth time:9:53 AM   Gender:Female  Living status:Living  Apgars:8 ,9  Weight:6 lb 9.5 oz (2.99 kg)                                 Magnesium Sulfate received: No BMZ received: No Rhophylac:N/A MMR:N/A T-DaP:Given prenatally Flu: Yes Transfusion:No  Physical exam  Vitals:   10/19/20 0200 10/19/20 0605 10/19/20 2143 10/20/20 0601  BP: 122/76 110/63 (!) 142/76 125/65  Pulse: 79 74  77  Resp: _0 Temp: 98 F (36.7 C) 97.7 F (36.5 C) 98 F (36.7 C) 98 F (36.7 C)  TempSrc: Oral Oral Oral Oral  SpO2: 97% 99% 99% 100%  Weight:      Height:       General: alert, cooperative and no distress Lochia: appropriate Uterine Fundus: firm Incision: Healing well with no significant drainage DVT Evaluation: No evidence of DVT seen on physical exam. Labs: Lab Results  Component Value Date   WBC 11.4 (H) 10/19/2020   HGB 9.4 (L) 10/19/2020   HCT 28.1 (L) 10/19/2020   MCV 88.1 10/19/2020   PLT 150 10/19/2020   CMP Latest Ref Rng & Units 10/19/2020  Glucose 70 - 99 mg/dL 111(H)  BUN 6 - 20 mg/dL -  Creatinine 0.44 - 1.00 mg/dL -  Sodium 135 - 145 mmol/L -  Potassium 3.5 - 5.1 mmol/L -  Chloride 98 - 111 mmol/L -  CO2 22 - 32 mmol/L -  Calcium 8.9 - 10.3 mg/dL -  Total Protein  6.5 - 8.1 g/dL -  Total Bilirubin 0.3 - 1.2 mg/dL -  Alkaline Phos 38 - 126 U/L -  AST 15 - 41 U/L -  ALT 0 - 44 U/L -   Edinburgh Score: Edinburgh Postnatal Depression Scale Screening Tool 10/19/2020  I have been able to laugh and see the funny side of things. 0  I have looked forward with enjoyment to things. 0  I have blamed myself unnecessarily when things went wrong. 0  I have been anxious or worried for no good reason. 0  I have felt scared or panicky for no good reason. 0  Things have been getting on top of me. 0  I have been so unhappy that I have had difficulty sleeping. 0  I have felt sad or miserable. 0  I have been so unhappy that I have been crying. 0  The thought of harming myself has occurred to  me. 0  Edinburgh Postnatal Depression Scale Total 0   After visit meds:  Allergies as of 10/20/2020   No Known Allergies     Medication List    STOP taking these medications   Accu-Chek Guide Me w/Device Kit   Accu-Chek Guide test strip Generic drug: glucose blood   Accu-Chek Softclix Lancets lancets   aspirin 81 MG EC tablet Commonly known as: EC-81 Aspirin   famotidine 20 MG tablet Commonly known as: PEPCID   Lantus SoloStar 100 UNIT/ML Solostar Pen Generic drug: insulin glargine   Prenatal 27-1 MG Tabs     TAKE these medications   acetaminophen 325 MG tablet Commonly known as: TYLENOL Take 2 tablets (650 mg total) by mouth every 4 (four) hours as needed for mild pain (temperature > 101.5.).   ibuprofen 600 MG tablet Commonly known as: ADVIL Take 1 tablet (600 mg total) by mouth every 6 (six) hours.   metFORMIN 1000 MG tablet Commonly known as: GLUCOPHAGE TAKE 1 TABLET (1,000 MG TOTAL) BY MOUTH 2 (TWO) TIMES DAILY WITH A MEAL.      Discharge home in stable condition Infant Feeding: Breast Infant Disposition:home with mother Discharge instruction: per After Visit Summary and Postpartum booklet. Activity: Advance as tolerated. Pelvic rest for 6 weeks.  Diet: routine diet Future Appointments: Future Appointments  Date Time Provider [place]  10/25/2020  1:15 PM [facility]  10/25/2020  2:15 PM Clarnce Flock, MD [facility]  11/01/2020  9:15 AM [facility] NST [facility]  11/01/2020 10:35 AM Donnamae Jude, MD [facility]  12/21/2020 10:00 AM Vevelyn Francois, NP [facility] None   Follow up Visit: Please schedule this patient for a In person postpartum visit in 6 weeks with the following provider: MD. Additional Postpartum F/U:Incision check 1 week, BP check 1 week and follow up diabetes   High risk pregnancy complicated by: PEC w/o SF, DM2 Delivery mode:  C-Section, Low Transverse  Anticipated Birth Control:  Unsure   10/20/2020 Gabriel Carina, CNM

## 2020-10-18 NOTE — Progress Notes (Signed)
Honeycomb dressing changed per order and pressure dressing applied.  Dressing change completed w/o difficulty, incision edges approximated and no signs or symptoms of infection noted with the dressing change.

## 2020-10-18 NOTE — Lactation Note (Signed)
This note was copied from a baby's chart. Lactation Consultation Note  Patient Name: Tina Brown Today's Date: 10/18/2020 Reason for consult: Initial assessment;Primapara;1st time breastfeeding;Early term 37-38.6wks Age:37 hours  Initial visit with 26 hours old infant of P1 mother. RN requested assistance due to infant low blood sugar and difficulty latching. Talked to mother about hand expression, demonstrated techinque and able to collect ~22mL.  RN was attempting to bottlefeed formula at this time. Infant is unable to seal against bottle's nipple and spits up before swallowing. LC spoon and finger-fed colostrum and provided ~32mL of formula via spoon. Placed baby skin to skin.  Demonstrated how to use DEBP to stimulate breasts. Reviewed milk storage and proper care of parts.   Promoted maternal rest, hydration and food intake. Encouraged to contact [facility] for support when ready to breastfeed baby and recommended to request help for questions or concerns.     Plan: 1-Skin to skin and then breastfeed on demand 2-Pump using initiation setting or hand express to supplement 3-Spoon or fingerfeed EBM  4-Pacing and upright position when bottle-feeding formula. 5-Contact LC for support   All questions answered at this time.   Maternal Data Has patient been taught Hand Expression?: Yes Does the patient have breastfeeding experience prior to this delivery?: No  Feeding Mother's Current Feeding Choice: Breast Milk and Formula Nipple Type: Extra Slow Flow  Interventions Interventions: Breast feeding basics reviewed;Skin to skin;Breast massage;Hand express;DEBP;Hand pump;Expressed milk;Education;Support pillows;Pre-pump if needed  Discharge [facility] Program: Yes  Consult Status Consult Status: Follow-up Date: 10/19/20 Follow-up type: In-patient    Tina Brown 10/18/2020, 9:21 PM

## 2020-10-18 NOTE — Progress Notes (Addendum)
Labor Progress Note Tina Brown is a 35 y.o. G1P0 at [redacted]w[redacted]d presented for induction of labor due toPreE without severe features.  S: Pt resting comfortably with epidural in place. No concerns at this time.  O:  BP 130/78   Pulse 100   Temp 98.7 F (37.1 C) (Axillary)   Resp 16   Ht 5\' 9"  (1.753 m)   Wt 78.9 kg   LMP 01/31/2020 (Exact Date)   SpO2 98%   BMI 25.69 kg/m  EFM: baseline 140 bpm/minimal-moderate variability/+accels/no decels Toco: ctx q8 min, 50 MVU  CVE: Dilation: 7 Effacement (%): 90 Station: Plus 1 Presentation: Vertex Exam by:: Dr. Astrid Drafts   A&P: 35 y.o. G1P0 [redacted]w[redacted]d presented for induction of labor due toPreE without severe features. #IOL: S/p cyto x2. FB out @0022 . Pit started @0100 , currently @20mL /hr. AROM @0915 , moderate meconium. IUPC placed at 2018 on 4/5 given minimal cervical change. Overnight, pt continues to have inadequate contraction pattern despite up-titration of pitocin and frequent maternal repositioning given ROP fetal presentation. Most recently at 0442, pitocin was stopped and dose of terb was administered given prolonged deceleration. Reassuringly, decel resolved. Will continue maternal repositioning and plan to restart high dose pitocin in approximately 3 hours pending fetal tolerance of labor. #Intrapartum Fever: maternal temperature to 100.21F at 2222 on 4/5; reassuringly no increase in fetal baseline and no recurrent fevers. Will continue to monitor. #Pain: epidural in place #FWB: Category 2 strip given periods of minimal variability s/p prolonged decel #GBS: negative #A2GDM: EFW 88%ile. Previously on insulin and glyburide. Not on meds since admit. BGL wnl. Continue to monitor. #Pre-E without SF:  Asymptomatic. P/C 0.34. BP normal to mild range. #Rh neg: rhogam postpartum  Randa Ngo, MD 5:03 AM

## 2020-10-18 NOTE — Progress Notes (Addendum)
Brief Update:   Called to room for fetal deceleration. Cervical exam 7/90/+1. Deceleration resolved with repositioning. At this time patient has been 7cm since yesterday at approximately 1800. Patient on pitocin up to 34 overnight with IUPC/FSE in place and still not able to achieve adequate contractions. Pitocin has been off since 0448 due to Cat II tracing and attempt at pitocin washout. Patient had fever of 100.7 x1, has been ruptured since 0900 on 4/5 with thick meconium. At this time, recommend primary cesarean section for arrest of dilation.   The risks of surgery were discussed with the patient including but were not limited to: bleeding which may require transfusion or reoperation; infection which may require antibiotics; injury to bowel, bladder, ureters or other surrounding organs; injury to the fetus; need for additional procedures including hysterectomy in the event of a life-threatening hemorrhage; formation of adhesions; placental abnormalities with subsequent pregnancies; incisional problems; thromboembolic phenomenon and other postoperative/anesthesia complications.  The patient concurred with the proposed plan, giving informed written consent for the procedure.   Patient has been NPO since midnight she will remain NPO for procedure. Anesthesia and OR aware. Preoperative prophylactic antibiotics and SCDs ordered on call to the OR.  To OR when ready.   Sharene Skeans, MD [facility] Family Medicine Fellow, [facility] for [facility], [place] of Attending Supervision of Provider:  Evaluation and management procedures were performed by this provider under my supervision and collaboration. I have reviewed the provider's note and chart, and I agree with the management and plan. IOL for 48 hours how. Ruptured for 24 hours. No cervical change for 24 hours. No cervical change after 12+ hours pitocin. Has now had 2 prolonged decelerations, the last not  on pitocin. Discussed risks/benefits of expectant mgmt vs c/s and shared decision to proceed w/ c/s. Interpreter used.   Laurey Arrow, MD Faculty Practice, [facility]

## 2020-10-19 DIAGNOSIS — O1405 Mild to moderate pre-eclampsia, complicating the puerperium: Secondary | ICD-10-CM

## 2020-10-19 DIAGNOSIS — O2413 Pre-existing diabetes mellitus, type 2, in the puerperium: Secondary | ICD-10-CM

## 2020-10-19 DIAGNOSIS — Z7984 Long term (current) use of oral hypoglycemic drugs: Secondary | ICD-10-CM

## 2020-10-19 LAB — CBC
HCT: 28.1 % — ABNORMAL LOW (ref 36.0–46.0)
Hemoglobin: 9.4 g/dL — ABNORMAL LOW (ref 12.0–15.0)
MCH: 29.5 pg (ref 26.0–34.0)
MCHC: 33.5 g/dL (ref 30.0–36.0)
MCV: 88.1 fL (ref 80.0–100.0)
Platelets: 150 10*3/uL (ref 150–400)
RBC: 3.19 MIL/uL — ABNORMAL LOW (ref 3.87–5.11)
RDW: 14.4 % (ref 11.5–15.5)
WBC: 11.4 10*3/uL — ABNORMAL HIGH (ref 4.0–10.5)
nRBC: 0 % (ref 0.0–0.2)

## 2020-10-19 LAB — GLUCOSE, CAPILLARY
Glucose-Capillary: 71 mg/dL (ref 70–99)
Glucose-Capillary: 73 mg/dL (ref 70–99)
Glucose-Capillary: 84 mg/dL (ref 70–99)

## 2020-10-19 LAB — GLUCOSE, RANDOM: Glucose, Bld: 111 mg/dL — ABNORMAL HIGH (ref 70–99)

## 2020-10-19 MED ORDER — FERROUS SULFATE 325 (65 FE) MG PO TABS
325.0000 mg | ORAL_TABLET | ORAL | Status: DC
  Administered 2020-10-19: 325 mg via ORAL
  Filled 2020-10-19: qty 1

## 2020-10-19 NOTE — Progress Notes (Signed)
POSTPARTUM PROGRESS NOTE  Subjective: Tina Brown is a 35 y.o. G1P1001 s/p pLTCS at [redacted]w[redacted]d secondary to arrest of dilation, non-reassuring heart tones.  She reports she doing well. No acute events overnight. She denies any problems with ambulating, voiding or po intake. Denies nausea or vomiting. She has passed flatus. Pain is well controlled.  Lochia is minimal.  Objective: Blood pressure 122/76, pulse 79, temperature 98 F (36.7 C), temperature source Oral, resp. rate 18, height 5\' 9"  (1.753 m), weight 78.9 kg, last menstrual period 01/31/2020, SpO2 97 %, unknown if currently breastfeeding.  Physical Exam:  General: alert, cooperative and no distress Chest: no respiratory distress Abdomen: soft, non-tender, pressure dressing c/d/i Uterine Fundus: firm and at level of umbilicus Extremities: No calf swelling or tenderness  trace bilateral LE edema  Recent Labs    10/17/20 0136 10/18/20 1051  HGB 11.6* 10.2*  HCT 35.2* 30.8*    Assessment/Plan: Tina Brown is a 35 y.o. G1P1001 s/p pLTCS at [redacted]w[redacted]d secondary to arrest of dilation, non-reassuring heart tones.   Routine Postpartum Care: Doing well, pain well-controlled.  -- Continue routine care, lactation support  -- Contraception: undecided s/p counseling today -- Feeding: breast -- T2DM: A1c 11.3 prior to pregnancy. Pt previously on lantus outside of pregnancy. Continued on metformin 1g BID. Given BG elevated to 201 yesterday, initiated lantus 10u qHS with SSI. -- Mild Preeclampsia: blood pressures normal range s/p delivery. No concerning signs/symptoms.  Dispo: Plan for discharge POD#2-3.  Randa Ngo, MD OB Fellow, Faculty [facility] 10/19/2020 3:17 AM

## 2020-10-19 NOTE — Progress Notes (Deleted)
POSTPARTUM PROGRESS NOTE  Subjective: Tina Brown is a 35 y.o. G1P1001 s/p PLTCS for arrest at [redacted]w[redacted]d.  She reports she doing well. No acute events overnight. She denies any problems with ambulating, voiding or po intake. Denies nausea or vomiting. She has passed flatus. Pain is well controlled.  Lochia is appropiate. Patient has no acute complaints at bedside.   Objective: Blood pressure 110/63, pulse 74, temperature 97.7 F (36.5 C), temperature source Oral, resp. rate 18, height 5\' 9"  (1.753 m), weight 78.9 kg, last menstrual period 01/31/2020, SpO2 99 %, unknown if currently breastfeeding.  Physical Exam:  General: alert, cooperative and no distress Chest: no respiratory distress Abdomen: soft, non-tender  Uterine Fundus: firm and at level of umbilicus Extremities: No calf swelling or tenderness  no edema Incision dressing is dry with intact.  Recent Labs    10/18/20 1051 10/19/20 0551  HGB 10.2* 9.4*  HCT 30.8* 28.1*    Assessment/Plan: Tina Brown is a 34 y.o. G1P1001 s/p PLTCS at [redacted]w[redacted]d for arrest; IOL due to mild pre-e.  Routine Postpartum Care: Doing well, pain well-controlled.  -- Continue routine care, lactation support  -- Contraception: Declines -- Feeding: Breast -- T2DM (insulin dependent): added Lantus 10u qHS. Unsure insulin dosage at home, placed on sliding scale to determine best dose. Metformin 1,000 mg BID. -- Mild pre-E: BP stable, 110/63; Redo protein/cr ratio -- Rh negative: Rho gam pp -- Language barrier: Interprator used via Ipad  Dispo: Plan for discharge tomorrow.  Tina Brown 10/19/2020 7:52 AM

## 2020-10-19 NOTE — Lactation Note (Signed)
This note was copied from a baby's chart. Lactation Consultation Note  Patient Name: Tina Brown YIAXK'P Date: 10/19/2020 Reason for consult: Follow-up assessment;Early term 37-38.6wks;1st time breastfeeding;Primapara;Infant weight loss;Other (Comment) (1 % weight loss, Arabic interpreter - [place] [phone], / [place] [phone] ( trouble with the interpreting line - only less than 10 mins . Deatra Canter [phone] to finish up with the Arabic interpreter with speech and summarize the [facility] plan and speech) Age:37 hours P 1 ,  Mom was given breast shells yesterday - still sitting on the counter ( LC assisted mom to put the shells on after pumping , good comfortable fit .  Reason for the shells for reverse pressure between feedings to make the nipple / areola complex more elastic , the areola is more compressible today compare to yesterday, but still needs work.  @ this consult with [place] assist , attempted to latch STS without success.  Applied the #24 NS given to her by the [facility] last night with formula instilled into the top and baby was unable to accommodate the base of the NS. Resized for a #20 NS and the NS kept popping off.  LC tried the yellow nipple without success and call the [facility] to ask the NP or Pedis MD for speech order. Order placed and [place] contacted Lenore Manner, Speech and she visited for a consult and feeding assessment.  Baby took 9 ml with a violet nipple Nufant .  ST recommended to feed with feeding cues and or by 2 1/2 - 3 hours.   mom pumped with the DEBP with LC assist and LC stressed the reason for pumping at this time , #24 F was good fit and per mom comfortable.    Maternal Data Has patient been taught Hand Expression?: Yes  Feeding Mother's Current Feeding Choice: Breast Milk and Formula Nipple Type: Nfant Slow Flow (purple)  LATCH Score Latch: Repeated attempts needed to sustain latch, nipple held in mouth throughout feeding, stimulation needed to elicit sucking reflex.  Audible  Swallowing: None  Type of Nipple: Everted at rest and after stimulation (semi compressible areolas)  Comfort (Breast/Nipple): Soft / non-tender  Hold (Positioning): Assistance needed to correctly position infant at breast and maintain latch.  LATCH Score: 6   Lactation Tools Discussed/Used Tools: Pump;Shells Nipple shield size: 20;24 (#20 NS would not stay on the nipple and the #24 NS was to large for the baby to accommodate it) Flange Size: 24 Breast pump type: Double-Electric Breast Pump Pump Education: Setup, frequency, and cleaning Reason for Pumping: DL, ET , LC stressed the importance of consistent pumping for at least 15 mins / save the milk Pumped volume: 0 mL  Interventions Interventions: Breast feeding basics reviewed  Discharge Pump: Manual  Consult Status Consult Status: Follow-up Date: 10/20/20 Follow-up type: In-patient    [place] 10/19/2020, 4:05 PM

## 2020-10-20 ENCOUNTER — Other Ambulatory Visit: Payer: Self-pay

## 2020-10-20 ENCOUNTER — Ambulatory Visit: Payer: Self-pay

## 2020-10-20 LAB — GLUCOSE, CAPILLARY
Glucose-Capillary: 58 mg/dL — ABNORMAL LOW (ref 70–99)
Glucose-Capillary: 83 mg/dL (ref 70–99)

## 2020-10-20 LAB — SURGICAL PATHOLOGY

## 2020-10-20 MED ORDER — IBUPROFEN 600 MG PO TABS
600.0000 mg | ORAL_TABLET | Freq: Four times a day (QID) | ORAL | 0 refills | Status: DC
  Filled 2020-10-20 (×2): qty 30, 8d supply, fill #0

## 2020-10-20 MED ORDER — ACETAMINOPHEN 325 MG PO TABS
650.0000 mg | ORAL_TABLET | ORAL | 0 refills | Status: DC | PRN
  Filled 2020-10-20: qty 60, 5d supply, fill #0

## 2020-10-20 NOTE — Progress Notes (Signed)
Hypoglycemic Event  CBG:58 Treatment:  Orange Juice  Symptoms:  none  Follow-up CBG: [id number]:[id number] CBG Result:83 Possible Reasons for Event:  Patient states did not eat last night  Comments/MD notified: [address] [place], Jaiden Wahab

## 2020-10-20 NOTE — Discharge Instructions (Signed)
**NEEDS CLOSE FOLLOW UP WITH PCP FOR DIABETIC MANAGEMENT**   Cesarean Delivery, Care After This sheet gives you information about how to care for yourself after your procedure. Your health care provider may also give you more specific instructions. If you have problems or questions, contact your health care provider. What can I expect after the procedure? After the procedure, it is common to have:  A small amount of blood or clear fluid coming from the incision.  Some redness, swelling, and pain in your incision area.  Some abdominal pain and soreness.  Vaginal bleeding (lochia). Even though you did not have a vaginal delivery, you will still have vaginal bleeding and discharge.  Pelvic cramps.  Fatigue. You may have pain, swelling, and discomfort in the tissue between your vagina and your anus (perineum) if:  Your C-section was unplanned, and you were allowed to labor and push.  An incision was made in the area (episiotomy) or the tissue tore during attempted vaginal delivery. Follow these instructions at home: Incision care  Follow instructions from your health care provider about how to take care of your incision. Make sure you: ? Wash your hands with soap and water before you change your bandage (dressing). If soap and water are not available, use hand sanitizer. ? If you have a dressing, change it or remove it as told by your health care provider. ? Leave stitches (sutures), skin staples, skin glue, or adhesive strips in place. These skin closures may need to stay in place for 2 weeks or longer. If adhesive strip edges start to loosen and curl up, you may trim the loose edges. Do not remove adhesive strips completely unless your health care provider tells you to do that.  Check your incision area every day for signs of infection. Check for: ? More redness, swelling, or pain. ? More fluid or blood. ? Warmth. ? Pus or a bad smell.  Do not take baths, swim, or use a hot tub  until your health care provider says it's okay. Ask your health care provider if you can take showers.  When you cough or sneeze, hug a pillow. This helps with pain and decreases the chance of your incision opening up (dehiscing). Do this until your incision heals.   Medicines  Take over-the-counter and prescription medicines only as told by your health care provider.  If you were prescribed an antibiotic medicine, take it as told by your health care provider. Do not stop taking the antibiotic even if you start to feel better.  Do not drive or use heavy machinery while taking prescription pain medicine. Lifestyle  Do not drink alcohol. This is especially important if you are breastfeeding or taking pain medicine.  Do not use any products that contain nicotine or tobacco, such as cigarettes, e-cigarettes, and chewing tobacco. If you need help quitting, ask your health care provider. Eating and drinking  Drink at least 8 eight-ounce glasses of water every day unless told not to by your health care provider. If you breastfeed, you may need to drink even more water.  Eat high-fiber foods every day. These foods may help prevent or relieve constipation. High-fiber foods include: ? Whole grain cereals and breads. ? Brown rice. ? Beans. ? Fresh fruits and vegetables. Activity  If possible, have someone help you care for your baby and help with household activities for at least a few days after you leave the hospital.  Return to your normal activities as told by your health care  provider. Ask your health care provider what activities are safe for you.  Rest as much as possible. Try to rest or take a nap while your baby is sleeping.  Do not lift anything that is heavier than 10 lbs (4.5 kg), or the limit that you were told, until your health care provider says that it is safe.  Talk with your health care provider about when you can engage in sexual activity. This may depend on your: ? Risk  of infection. ? How fast you heal. ? Comfort and desire to engage in sexual activity.   General instructions  Do not use tampons or douches until your health care provider approves.  Wear loose, comfortable clothing and a supportive and well-fitting bra.  Keep your perineum clean and dry. Wipe from front to back when you use the toilet.  If you pass a blood clot, save it and call your health care provider to discuss. Do not flush blood clots down the toilet before you get instructions from your health care provider.  Keep all follow-up visits for you and your baby as told by your health care provider. This is important. Contact a health care provider if:  You have: ? A fever. ? Bad-smelling vaginal discharge. ? Pus or a bad smell coming from your incision. ? Difficulty or pain when urinating. ? A sudden increase or decrease in the frequency of your bowel movements. ? More redness, swelling, or pain around your incision. ? More fluid or blood coming from your incision. ? A rash. ? Nausea. ? Little or no interest in activities you used to enjoy. ? Questions about caring for yourself or your baby.  Your incision feels warm to the touch.  Your breasts turn red or become painful or hard.  You feel unusually sad or worried.  You vomit.  You pass a blood clot from your vagina.  You urinate more than usual.  You are dizzy or light-headed. Get help right away if:  You have: ? Pain that does not go away or get better with medicine. ? Chest pain. ? Difficulty breathing. ? Blurred vision or spots in your vision. ? Thoughts about hurting yourself or your baby. ? New pain in your abdomen or in one of your legs. ? A severe headache.  You faint.  You bleed from your vagina so much that you fill more than one sanitary pad in one hour. Bleeding should not be heavier than your heaviest period. Summary  After the procedure, it is common to have pain at your incision site,  abdominal cramping, and slight bleeding from your vagina.  Check your incision area every day for signs of infection.  Tell your health care provider about any unusual symptoms.  Keep all follow-up visits for you and your baby as told by your health care provider. This information is not intended to replace advice given to you by your health care provider. Make sure you discuss any questions you have with your health care provider. Document Revised: 01/07/2018 Document Reviewed: 01/07/2018 Elsevier Patient Education  [place].

## 2020-10-20 NOTE — Lactation Note (Signed)
This note was copied from a baby's chart. Lactation Consultation Note  Patient Name: Tina Brown QDIYM'E Date: 10/20/2020 Reason for consult: Follow-up assessment;Primapara;1st time breastfeeding;Infant weight loss;Difficult latch;Maternal endocrine disorder;Other (Comment) (mom and babt were added on D/C for today and LC was made aware just prior to dyad being ready to go home - see [place] note.) Age:37 hours  Tina Brown  [phone] Arabic interpreter  Tina Brown has been a difficult latch and has had Speech consult and using the  Violet Nfant nipple. Today has increased to 28 ml and tolerated the Nfant nipple.  Mom made aware the baby needs to feed with feeding cues and by 3 hours and take at least 30 ml per feeding , more if still hungry. EBM if available and or formula.  LC reviewed supply and demand/ importance of consistent pumping at least 8 times a day or greater. LC reviewed sore nipple and engorgement prevention and tx . LC stressed the importance of prevention of engorgement to protect the milk supply by softening down the breast.  LC also recommended since baby will have F/U at the [place],  [facility] consultant will be able to F/U for reevaluation of the latch.  LC will send a message to Van Clines at the [facility] center to F/U.  Mom active with WIC / [place] and has a scheduled appt for next Tuesday [facility] ), Since mom and baby are going home today, LC called WIC and talked to [place] and she was unable to accommodate mom for appt today.  LC recommended leaving the appt for Tuesday and LC would provide a [facility] loaner today at D/C . $30.oo obtained/ paperwork completed and copy provided with instructions to return the DEBP within 10 days on or by 4/18.  Both mom and dad receptive to teaching.   Maternal Data    Feeding Mother's Current Feeding Choice: Breast Milk and Formula Nipple Type: Nfant Slow Flow (purple)  LATCH Score                    Lactation Tools  Discussed/Used Tools: Shells;Pump;Flanges Flange Size: 24;27 Breast pump type: Manual;Double-Electric Breast Pump Pump Education: Milk Storage Pumping frequency: per mom x3 since [place] visit yesterday when [place] had mom pumped at consult.  Interventions Interventions: Breast feeding basics reviewed;Shells;DEBP;Hand pump;Education  Discharge Discharge Education: Engorgement and breast care;Warning signs for feeding baby Pump: Personal;Manual;DEBP;WIC Loaner WIC Program: Yes  Consult Status Consult Status: Complete Date: 10/20/20    Myer Haff 10/20/2020, 2:48 PM

## 2020-10-23 ENCOUNTER — Encounter: Payer: Self-pay | Admitting: *Deleted

## 2020-10-25 ENCOUNTER — Encounter: Payer: Medicaid Other | Admitting: Family Medicine

## 2020-10-25 ENCOUNTER — Other Ambulatory Visit: Payer: Medicaid Other

## 2020-10-26 ENCOUNTER — Ambulatory Visit: Payer: Medicaid Other

## 2020-10-30 ENCOUNTER — Telehealth: Payer: Self-pay | Admitting: *Deleted

## 2020-10-30 ENCOUNTER — Telehealth: Payer: Self-pay | Admitting: Family Medicine

## 2020-10-30 NOTE — Telephone Encounter (Signed)
LVM for incision check tomorrow at 130pm. Advised pt to call office to confirm or change time

## 2020-10-30 NOTE — Telephone Encounter (Signed)
Stanford Breed, RN with [facility] left a message she had a phone call with patient and patient said she needs an appointment for an incision check. Per chart review patient delivered 10/18/20 . Patient  had an incision check scheduled 10/26/20 and she DNKA. I messaged registrar to schedule for tomorrow if possible.  Zyiah Withington,RN

## 2020-10-31 ENCOUNTER — Ambulatory Visit: Payer: Medicaid Other

## 2020-11-01 ENCOUNTER — Encounter: Payer: Medicaid Other | Admitting: Family Medicine

## 2020-11-01 ENCOUNTER — Other Ambulatory Visit: Payer: Medicaid Other

## 2020-11-06 ENCOUNTER — Other Ambulatory Visit: Payer: Self-pay

## 2020-11-06 MED FILL — Metformin HCl Tab 1000 MG: ORAL | 30 days supply | Qty: 60 | Fill #0 | Status: AC

## 2020-11-07 ENCOUNTER — Other Ambulatory Visit: Payer: Self-pay

## 2020-11-07 MED ORDER — METFORMIN HCL 1000 MG PO TABS
1000.0000 mg | ORAL_TABLET | Freq: Two times a day (BID) | ORAL | 0 refills | Status: DC
  Filled 2020-12-08: qty 60, 30d supply, fill #0

## 2020-11-08 ENCOUNTER — Other Ambulatory Visit: Payer: Self-pay

## 2020-11-08 ENCOUNTER — Ambulatory Visit (INDEPENDENT_AMBULATORY_CARE_PROVIDER_SITE_OTHER): Payer: Medicaid Other

## 2020-11-08 VITALS — BP 125/83 | HR 88 | Wt 150.6 lb

## 2020-11-08 DIAGNOSIS — Z4889 Encounter for other specified surgical aftercare: Secondary | ICD-10-CM

## 2020-11-08 MED ORDER — PREPLUS 27-1 MG PO TABS
1.0000 | ORAL_TABLET | Freq: Every day | ORAL | 6 refills | Status: DC
  Filled 2020-11-08: qty 30, 30d supply, fill #0
  Filled 2020-12-22: qty 30, 30d supply, fill #1
  Filled 2021-01-15: qty 30, 30d supply, fill #2
  Filled 2021-02-13: qty 30, 30d supply, fill #3
  Filled 2021-05-03: qty 30, 30d supply, fill #4
  Filled 2021-06-12: qty 30, 30d supply, fill #5
  Filled 2021-07-17: qty 30, 30d supply, fill #6

## 2020-11-08 NOTE — Progress Notes (Signed)
Pt here today for incision check. Pt delivered via C/S on 10/18/20.   Pt's  incision is clean, dry and intact. Denies any drainage, pain or redness.   Pt has PP visit with Dr Ernestina Patches 12/04/20. Pt aware and agreeable to date and time of appt.  Pt asking to have prenatal vitamins Rx sent to pharmacy. Rx sent.   Pt has concerns with breastfeeding. Ok per Ivin Booty to have lactation appt. Pt will schedule at check out today.   Colletta Maryland, RN  11/08/20

## 2020-11-08 NOTE — Progress Notes (Signed)
Chart reviewed for nurse visit. Agree with plan of care.   Clarnce Flock, MD 11/08/20 1:18 PM

## 2020-11-09 ENCOUNTER — Other Ambulatory Visit: Payer: Self-pay

## 2020-11-10 ENCOUNTER — Other Ambulatory Visit: Payer: Self-pay

## 2020-12-04 ENCOUNTER — Encounter: Payer: Self-pay | Admitting: Family Medicine

## 2020-12-04 ENCOUNTER — Other Ambulatory Visit: Payer: Self-pay

## 2020-12-04 ENCOUNTER — Ambulatory Visit (INDEPENDENT_AMBULATORY_CARE_PROVIDER_SITE_OTHER): Payer: Medicaid Other | Admitting: Family Medicine

## 2020-12-04 DIAGNOSIS — D251 Intramural leiomyoma of uterus: Secondary | ICD-10-CM

## 2020-12-04 DIAGNOSIS — Z98891 History of uterine scar from previous surgery: Secondary | ICD-10-CM

## 2020-12-04 DIAGNOSIS — E118 Type 2 diabetes mellitus with unspecified complications: Secondary | ICD-10-CM

## 2020-12-04 DIAGNOSIS — Z8759 Personal history of other complications of pregnancy, childbirth and the puerperium: Secondary | ICD-10-CM

## 2020-12-04 NOTE — Progress Notes (Signed)
Post Partum Visit Note  Tina Brown is a 35 y.o. G66P1001 female who presents for a postpartum visit. She is 6 weeks postpartum following a primary cesarean section.  I have fully reviewed the prenatal and intrapartum course. The delivery was at [redacted]w[redacted]d gestational weeks.  Anesthesia: epidural. Postpartum course has been uncomplicated. Baby is doing well. Baby is feeding by both breast and bottle - Similac 360 Total Care. Bleeding no bleeding. Bowel function is normal. Bladder function is normal. Patient is not sexually active. Contraception method is none. Postpartum depression screening: negative.   The pregnancy intention screening data noted above was reviewed. Potential methods of contraception were discussed. The patient elected to proceed with FAM or LAM.    Edinburgh Postnatal Depression Scale - 12/04/20 1329      Edinburgh Postnatal Depression Scale:  In the Past 7 Days   I have been able to laugh and see the funny side of things. 0    I have looked forward with enjoyment to things. 0    I have blamed myself unnecessarily when things went wrong. 0    I have been anxious or worried for no good reason. 0    I have felt scared or panicky for no good reason. 0    Things have been getting on top of me. 0    I have been so unhappy that I have had difficulty sleeping. 0    I have felt sad or miserable. 0    I have been so unhappy that I have been crying. 0    The thought of harming myself has occurred to me. 0    Edinburgh Postnatal Depression Scale Total 0           Health Maintenance Due  Topic Date Due  . COVID-19 Vaccine (1) Never done  . OPHTHALMOLOGY EXAM  Never done  . URINE MICROALBUMIN  09/03/2018  . FOOT EXAM  05/13/2019    The following portions of the patient's history were reviewed and updated as appropriate: allergies, current medications, past family history, past medical history, past social history, past surgical history and problem list.  Review of  Systems Pertinent items are noted in HPI.  Objective:  BP 115/87   Pulse 82   Wt 154 lb (69.9 kg)   Breastfeeding Yes   BMI 22.74 kg/m    General:  alert, cooperative and appears stated age   Breasts:  normal  Lungs: clear to auscultation bilaterally  Heart:  regular rate and rhythm, S1, S2 normal, no murmur, click, rub or gallop  Abdomen: soft, non-tender; bowel sounds normal; no masses,  no organomegaly   Wound well approximated incision. No concerns   GU exam:  not indicated       Assessment:   Normal postpartum exam.   Plan:   Essential components of care per ACOG recommendations:  1.  Mood and well being: Patient with negative depression screening today. Reviewed local resources for support.  - Patient tobacco use? No.   - hx of drug use? No.    2. Infant care and feeding:  -Patient currently breastmilk feeding? Yes. Reviewed importance of draining breast regularly to support lactation.  -Social determinants of health (SDOH) reviewed in EPIC. No concerns. The following needs were identified-- needs molar extraction and wants a list of dentists accepting [facility]  3. Sexuality, contraception and birth spacing - Patient does not want a pregnancy in the next year.  Desired family size is 2 children--  she used IVF for this pregnancy  - Reviewed forms of contraception in tiered fashion. Patient desired no method today.   - Discussed birth spacing of 18 months  4. Sleep and fatigue -Encouraged family/partner/community support of 4 hrs of uninterrupted sleep to help with mood and fatigue  5. Physical Recovery  - Discussed patients delivery and complications. She describes her labor as mixed. - Patient had a C-section.  Perineal healing reviewed. Patient expressed understanding - Patient has urinary incontinence? No. - Patient is safe to resume physical and sexual activity  6.  Health Maintenance - HM due items addressed Yes - Last pap smear  Diagnosis  Date Value  Ref Range Status  07/06/2020   Final   - Negative for intraepithelial lesion or malignancy (NILM)   Pap smear not done at today's visit.  -Breast Cancer screening indicated? No.   7. Chronic Disease/Pregnancy Condition follow up: Hypertension and Gestational Diabetes  1. Intramural leiomyoma of uterus - US PELVIS (TRANSABDOMINAL ONLY); Future  2. Diabetes mellitus with complication ([place]) Needs PCP visit in 1-3 months.  - Hemoglobin A1c - Urine Microalbumin w/creat. ratio  3. Postpartum exam WNL  4. History of gestational hypertension BP is WNL  5. History of cesarean delivery Scar healing well - PCP follow up  Caren Macadam, [place] for [place], [place]

## 2020-12-04 NOTE — Patient Instructions (Signed)
  Dental Resources   [place]   Dr. Carl Little  Exam $85   [address]  Extraction $120 and up   [place], [place]  *full list of prices available*   [phone]      [facility]  Exam $94   [address]  Exam w/ Xrays $380   [place], [place]  Xrays $68 and up   [phone]  Cleaning $101   Extraction $190 and up      [facility]  Cleaning + Xray $344   [address]  Extraction- pt has to be seen first to give price   [place], [place]   [phone]     [place]   Dr. Ben Turner/Dr. Clay Burton  Exam, Cleaning, Xray $262   [address]  Extraction $207-$317   [place]   [phone]      [facility]  Cleaning $5   [address]  Xray $5   [place], [place] [address]  Call to get on waiting list   [phone] ext [phone]     Dr. James McMasters/Dr. Eric Sadler   [address]  Xray $85 Each   [place], [place] [address]  Extraction $200    Dr. Stacey Green  Extraction $300 per tooth   [address]   [place], [place] [address]   [phone]      Dr. Janna Civils  Cleaning $300   [address]  Extraction $273   [place], [place] [address]   [phone]     [place]   [facility]  Emergency Exam $65   [address]  Cleaning & Exam $150   [place],  [address]  Extractions: Simple $180 Surgical $250   [phone]  Fillings $150-$225     

## 2020-12-05 LAB — HEMOGLOBIN A1C
Est. average glucose Bld gHb Est-mCnc: 128 mg/dL
Hgb A1c MFr Bld: 6.1 % — ABNORMAL HIGH (ref 4.8–5.6)

## 2020-12-05 LAB — MICROALBUMIN / CREATININE URINE RATIO
Creatinine, Urine: 141.9 mg/dL
Microalb/Creat Ratio: 10 mg/g creat (ref 0–29)
Microalbumin, Urine: 13.7 ug/mL

## 2020-12-06 ENCOUNTER — Telehealth: Payer: Self-pay

## 2020-12-06 ENCOUNTER — Telehealth: Payer: Self-pay | Admitting: *Deleted

## 2020-12-06 NOTE — Telephone Encounter (Signed)
-----   Message from Caren Macadam, MD sent at 12/05/2020  5:07 PM EDT ----- HA1C is in prediabetes range. Continue metformin and follow up with PCP Microalbumin is elevated and should follow up with PCP to assure kidney assessment.

## 2020-12-06 NOTE — Telephone Encounter (Signed)
I called [place] with [employer] [phone] and gave her results and recommendations per Dr. Ernestina Patches.  She voices understanding and states she already has appointment with her PCP C. King on 12/21/20. She also asked what the appointment is for on 6/2. I reviewed chart and informed her is to follow up fibroids. She voices understanding. Libi Corso,RN

## 2020-12-06 NOTE — Telephone Encounter (Signed)
[employer]: Garlon Hatchet [phone]  Left message on mobile number, home number and spouse number to return call to office for results. Will try a second attempt.   Colletta Maryland, RN

## 2020-12-08 ENCOUNTER — Other Ambulatory Visit: Payer: Self-pay

## 2020-12-13 NOTE — Telephone Encounter (Signed)
Pt has read mychart message from Dr Ernestina Patches.   Encounter closed  Colletta Maryland, Therapist, sports

## 2020-12-14 ENCOUNTER — Ambulatory Visit (HOSPITAL_COMMUNITY)
Admission: RE | Admit: 2020-12-14 | Discharge: 2020-12-14 | Disposition: A | Discharge status: Home / Self Care | Payer: Medicaid Other | Source: Ambulatory Visit | Attending: Family Medicine | Admitting: Family Medicine

## 2020-12-14 ENCOUNTER — Other Ambulatory Visit: Payer: Self-pay

## 2020-12-14 DIAGNOSIS — D251 Intramural leiomyoma of uterus: Secondary | ICD-10-CM | POA: Insufficient documentation

## 2020-12-21 ENCOUNTER — Ambulatory Visit: Payer: Medicaid Other

## 2020-12-21 ENCOUNTER — Encounter: Payer: Self-pay | Admitting: Nurse Practitioner

## 2020-12-21 ENCOUNTER — Other Ambulatory Visit: Payer: Self-pay

## 2020-12-21 ENCOUNTER — Telehealth (INDEPENDENT_AMBULATORY_CARE_PROVIDER_SITE_OTHER): Payer: Medicaid Other | Admitting: Nurse Practitioner

## 2020-12-21 VITALS — BP 114/67 | HR 79

## 2020-12-21 DIAGNOSIS — E119 Type 2 diabetes mellitus without complications: Secondary | ICD-10-CM

## 2020-12-21 LAB — POCT URINALYSIS DIPSTICK
Bilirubin, UA: NEGATIVE
Blood, UA: NEGATIVE
Glucose, UA: NEGATIVE
Leukocytes, UA: NEGATIVE
Nitrite, UA: NEGATIVE
Protein, UA: NEGATIVE
Spec Grav, UA: >=1.03 — AB (ref 1.010–1.025)
Urobilinogen, UA: 0.2 E.U./dL
pH, UA: 5.5 (ref 5.0–8.0)

## 2020-12-21 NOTE — Patient Instructions (Signed)
Diabetes Mellitus and Nutrition, Adult When you have diabetes, or diabetes mellitus, it is very important to have healthy eating habits because your blood sugar (glucose) levels are greatly affected by what you eat and drink. Eating healthy foods in the right amounts, at about the same times every day, can help you:  Control your blood glucose.  Lower your risk of heart disease.  Improve your blood pressure.  Reach or maintain a healthy weight. What can affect my meal plan? Every person with diabetes is different, and each person has different needs for a meal plan. Your health care provider may recommend that you work with a dietitian to make a meal plan that is best for you. Your meal plan may vary depending on factors such as:  The calories you need.  The medicines you take.  Your weight.  Your blood glucose, blood pressure, and cholesterol levels.  Your activity level.  Other health conditions you have, such as heart or kidney disease. How do carbohydrates affect me? Carbohydrates, also called carbs, affect your blood glucose level more than any other type of food. Eating carbs naturally raises the amount of glucose in your blood. Carb counting is a method for keeping track of how many carbs you eat. Counting carbs is important to keep your blood glucose at a healthy level, especially if you use insulin or take certain oral diabetes medicines. It is important to know how many carbs you can safely have in each meal. This is different for every person. Your dietitian can help you calculate how many carbs you should have at each meal and for each snack. How does alcohol affect me? Alcohol can cause a sudden decrease in blood glucose (hypoglycemia), especially if you use insulin or take certain oral diabetes medicines. Hypoglycemia can be a life-threatening condition. Symptoms of hypoglycemia, such as sleepiness, dizziness, and confusion, are similar to symptoms of having too much  alcohol.  Do not drink alcohol if: ? Your health care provider tells you not to drink. ? You are pregnant, may be pregnant, or are planning to become pregnant.  If you drink alcohol: ? Do not drink on an empty stomach. ? Limit how much you use to:  0-1 drink a day for women.  0-2 drinks a day for men. ? Be aware of how much alcohol is in your drink. In the [place] one drink equals one 12 oz bottle of beer (355 mL), one 5 oz glass of wine (148 mL), or one 1 oz glass of hard liquor (44 mL). ? Keep yourself hydrated with water, diet soda, or unsweetened iced tea.  Keep in mind that regular soda, juice, and other mixers may contain a lot of sugar and must be counted as carbs. What are tips for following this plan? Reading food labels  Start by checking the serving size on the "Nutrition Facts" label of packaged foods and drinks. The amount of calories, carbs, fats, and other nutrients listed on the label is based on one serving of the item. Many items contain more than one serving per package.  Check the total grams (g) of carbs in one serving. You can calculate the number of servings of carbs in one serving by dividing the total carbs by 15. For example, if a food has 30 g of total carbs per serving, it would be equal to 2 servings of carbs.  Check the number of grams (g) of saturated fats and trans fats in one serving. Choose foods that have   a low amount or none of these fats.  Check the number of milligrams (mg) of salt (sodium) in one serving. Most people should limit total sodium intake to less than 2,300 mg per day.  Always check the nutrition information of foods labeled as "low-fat" or "nonfat." These foods may be higher in added sugar or refined carbs and should be avoided.  Talk to your dietitian to identify your daily goals for nutrients listed on the label. Shopping  Avoid buying canned, pre-made, or processed foods. These foods tend to be high in fat, sodium, and added  sugar.  Shop around the outside edge of the grocery store. This is where you will most often find fresh fruits and vegetables, bulk grains, fresh meats, and fresh dairy. Cooking  Use low-heat cooking methods, such as baking, instead of high-heat cooking methods like deep frying.  Cook using healthy oils, such as olive, canola, or sunflower oil.  Avoid cooking with butter, cream, or high-fat meats. Meal planning  Eat meals and snacks regularly, preferably at the same times every day. Avoid going long periods of time without eating.  Eat foods that are high in fiber, such as fresh fruits, vegetables, beans, and whole grains. Talk with your dietitian about how many servings of carbs you can eat at each meal.  Eat 4-6 oz (112-168 g) of lean protein each day, such as lean meat, chicken, fish, eggs, or tofu. One ounce (oz) of lean protein is equal to: ? 1 oz (28 g) of meat, chicken, or fish. ? 1 egg. ?  cup (62 g) of tofu.  Eat some foods each day that contain healthy fats, such as avocado, nuts, seeds, and fish.   What foods should I eat? Fruits Berries. Apples. Oranges. Peaches. Apricots. Plums. Grapes. Mango. Papaya. Pomegranate. Kiwi. Cherries. Vegetables Lettuce. Spinach. Leafy greens, including kale, chard, collard greens, and mustard greens. Beets. Cauliflower. Cabbage. Broccoli. Carrots. Green beans. Tomatoes. Peppers. Onions. Cucumbers. Brussels sprouts. Grains Whole grains, such as whole-wheat or whole-grain bread, crackers, tortillas, cereal, and pasta. Unsweetened oatmeal. Quinoa. Brown or wild rice. Meats and other proteins Seafood. Poultry without skin. Lean cuts of poultry and beef. Tofu. Nuts. Seeds. Dairy Low-fat or fat-free dairy products such as milk, yogurt, and cheese. The items listed above may not be a complete list of foods and beverages you can eat. Contact a dietitian for more information. What foods should I avoid? Fruits Fruits canned with  syrup. Vegetables Canned vegetables. Frozen vegetables with butter or cream sauce. Grains Refined white flour and flour products such as bread, pasta, snack foods, and cereals. Avoid all processed foods. Meats and other proteins Fatty cuts of meat. Poultry with skin. Breaded or fried meats. Processed meat. Avoid saturated fats. Dairy Full-fat yogurt, cheese, or milk. Beverages Sweetened drinks, such as soda or iced tea. The items listed above may not be a complete list of foods and beverages you should avoid. Contact a dietitian for more information. Questions to ask a health care provider  Do I need to meet with a diabetes educator?  Do I need to meet with a dietitian?  What number can I call if I have questions?  When are the best times to check my blood glucose? Where to find more information:  American Diabetes Association: diabetes.org  Academy of Nutrition and Dietetics: www.eatright.org  National Institute of Diabetes and Digestive and Kidney Diseases: www.niddk.nih.gov  Association of Diabetes Care and Education Specialists: www.diabeteseducator.org Summary  It is important to have healthy eating   habits because your blood sugar (glucose) levels are greatly affected by what you eat and drink.  A healthy meal plan will help you control your blood glucose and maintain a healthy lifestyle.  Your health care provider may recommend that you work with a dietitian to make a meal plan that is best for you.  Keep in mind that carbohydrates (carbs) and alcohol have immediate effects on your blood glucose levels. It is important to count carbs and to use alcohol carefully. This information is not intended to replace advice given to you by your health care provider. Make sure you discuss any questions you have with your health care provider. Document Revised: 06/08/2019 Document Reviewed: 06/08/2019 Elsevier Patient Education  2021 Elsevier Inc.  

## 2020-12-21 NOTE — Progress Notes (Signed)
   [place], [place]  [id number] Phone:  [phone]   Fax:  [phone] Virtual Visit via telphone Note  I connected with Tina Brown on 12/21/20 at 10:00 AM EDT by video and verified that I am speaking with the correct person using two identifiers.   I discussed the limitations, risks, security and privacy concerns of performing an evaluation and management service by telephone and the availability of in person appointments. I also discussed with the patient that there may be a patient responsible charge related to this service. The patient expressed understanding and agreed to proceed.  Patient home Provider home  History of Present Illness: Follow up for DM with interpreter. She has postpartum  2 months follow up A1c was in  6.3%. She is currently on Metformin 1000 mg BID. She reports diabetes diagnoses in 2009. She is concern if she should this treatment. She monitors her CBG daily. She reports 72-160.  She is concern about her BP it was elevated during delivery. . Denies headache, dizziness, visual changes, shortness of breath, dyspnea on exertion, chest pain, nausea, vomiting or any edema.   Observations/Objective: Telephone visit no exam  Assessment and Plan: Assessment  Primary Diagnosis & Pertinent Problem List: The encounter diagnosis was Diabetes mellitus without complication ([place]).  Visit Diagnosis: 1. Diabetes mellitus without complication ([facility])  Encourage compliance with current treatment regimen   Encourage regular CBG monitoring Encourage contacting office if excessive hyperglycemia and or hypoglycemia Lifestyle modification with healthy diet (fewer calories, more high fiber foods, whole grains and non-starchy vegetables, lower fat meat and fish, low-fat diary include healthy oils) regular exercise (physical activity) and weight loss Home BP monitoring also encouraged goal <130/80      Follow Up Instructions:   3 mo   I discussed the assessment and treatment plan with the patient. The patient was provided an opportunity to ask questions and all were answered. The patient agreed with the plan and demonstrated an understanding of the instructions.   The patient was advised to call back or seek an in-person evaluation if the symptoms worsen or if the condition fails to improve as anticipated.  I provided 20 minutes of telephone visit time during this encounter.   Vevelyn Francois, NP

## 2020-12-22 ENCOUNTER — Other Ambulatory Visit: Payer: Self-pay

## 2020-12-22 LAB — COMP. METABOLIC PANEL (12)
AST: 18 IU/L (ref 0–40)
Albumin/Globulin Ratio: 1.5 (ref 1.2–2.2)
Albumin: 4.5 g/dL (ref 3.8–4.8)
Alkaline Phosphatase: 87 IU/L (ref 44–121)
BUN/Creatinine Ratio: 21 (ref 9–23)
BUN: 13 mg/dL (ref 6–20)
Bilirubin Total: <0.2 mg/dL (ref 0.0–1.2)
Calcium: 9.5 mg/dL (ref 8.7–10.2)
Chloride: 101 mmol/L (ref 96–106)
Creatinine, Ser: 0.61 mg/dL (ref 0.57–1.00)
Globulin, Total: 3 g/dL (ref 1.5–4.5)
Glucose: 154 mg/dL — ABNORMAL HIGH (ref 65–99)
Potassium: 4.1 mmol/L (ref 3.5–5.2)
Sodium: 138 mmol/L (ref 134–144)
Total Protein: 7.5 g/dL (ref 6.0–8.5)
eGFR: 118 mL/min/{1.73_m2} (ref >59)

## 2021-01-15 ENCOUNTER — Other Ambulatory Visit: Payer: Self-pay | Admitting: Obstetrics and Gynecology

## 2021-01-16 ENCOUNTER — Other Ambulatory Visit: Payer: Self-pay | Admitting: Nurse Practitioner

## 2021-01-16 ENCOUNTER — Other Ambulatory Visit: Payer: Self-pay

## 2021-01-16 MED ORDER — METFORMIN HCL 1000 MG PO TABS
1000.0000 mg | ORAL_TABLET | Freq: Two times a day (BID) | ORAL | 0 refills | Status: DC
  Filled 2021-01-16: qty 60, 30d supply, fill #0

## 2021-01-17 ENCOUNTER — Telehealth: Payer: Self-pay

## 2021-01-17 NOTE — Telephone Encounter (Signed)
Metformin 

## 2021-01-17 NOTE — Telephone Encounter (Signed)
Rx was refilled yesterday. Patient aware

## 2021-01-18 ENCOUNTER — Other Ambulatory Visit: Payer: Self-pay

## 2021-01-18 ENCOUNTER — Other Ambulatory Visit: Payer: Self-pay | Admitting: Nurse Practitioner

## 2021-01-18 MED ORDER — METFORMIN HCL 1000 MG PO TABS
1000.0000 mg | ORAL_TABLET | Freq: Two times a day (BID) | ORAL | 3 refills | Status: DC
  Filled 2021-01-18 – 2021-02-13 (×2): qty 180, 90d supply, fill #0

## 2021-01-29 ENCOUNTER — Other Ambulatory Visit: Payer: Self-pay

## 2021-02-13 ENCOUNTER — Other Ambulatory Visit: Payer: Self-pay

## 2021-02-16 ENCOUNTER — Other Ambulatory Visit: Payer: Self-pay

## 2021-03-02 IMAGING — US US MFM FETAL BPP W/O NON-STRESS
1 series · 12 of 25 positions shown · non-contrast
Comparison: none

[Series 1: us mfm fetal bpp w/o non-stress · 25 acquisitions, 12 frames shown]
[im 2/25]
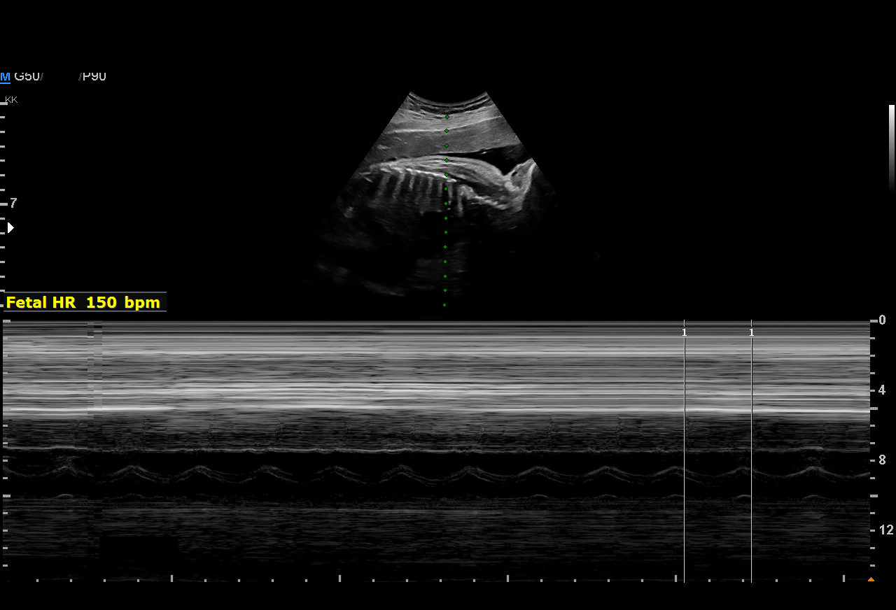
[im 4/25]
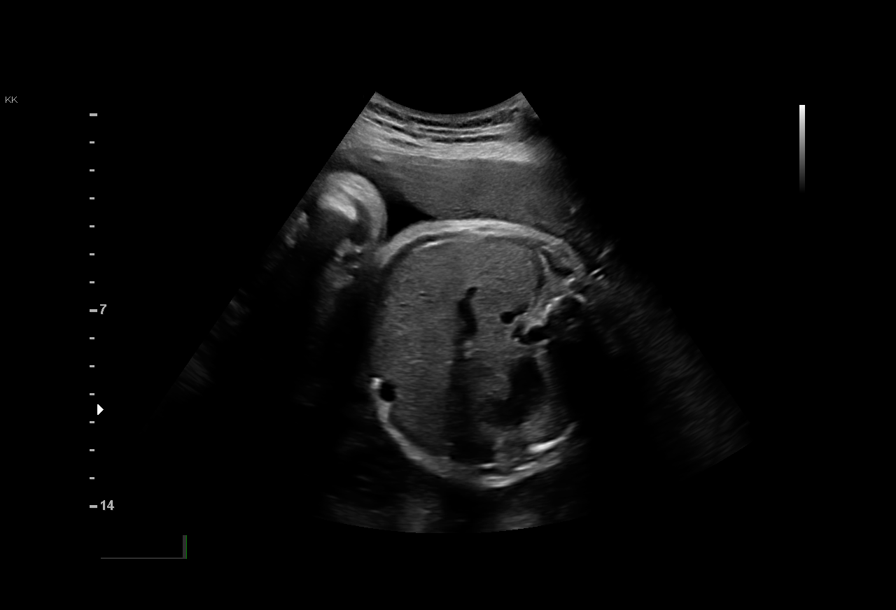
[im 6/25]
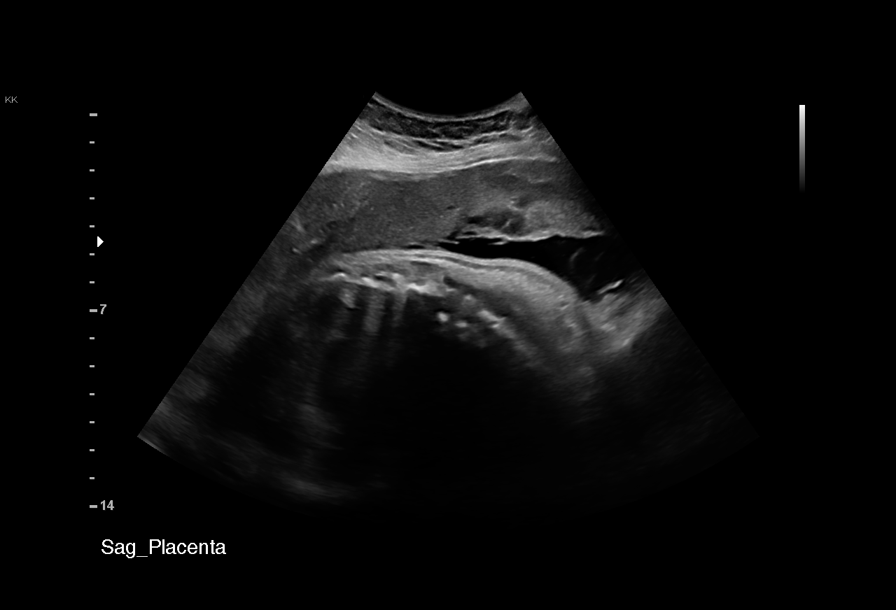
[im 8/25]
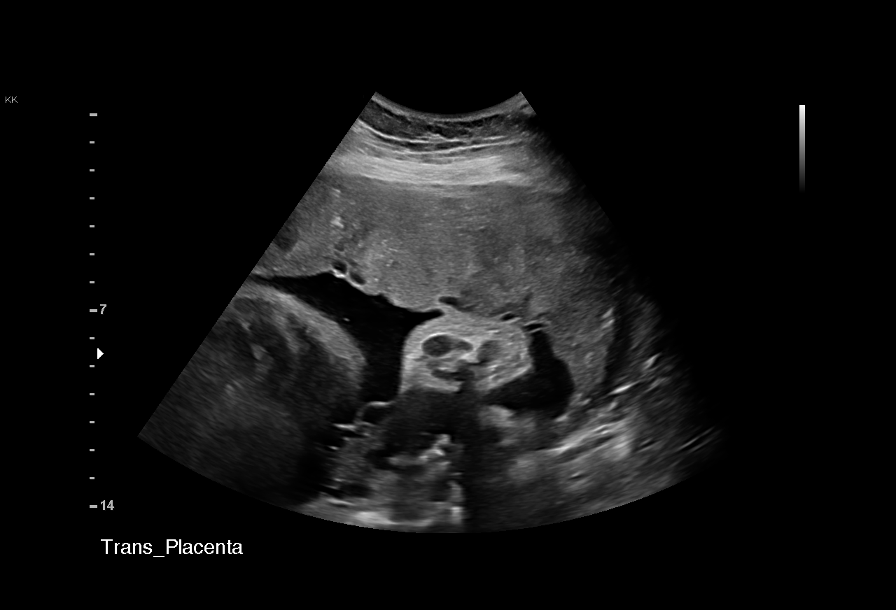
[im 10/25]
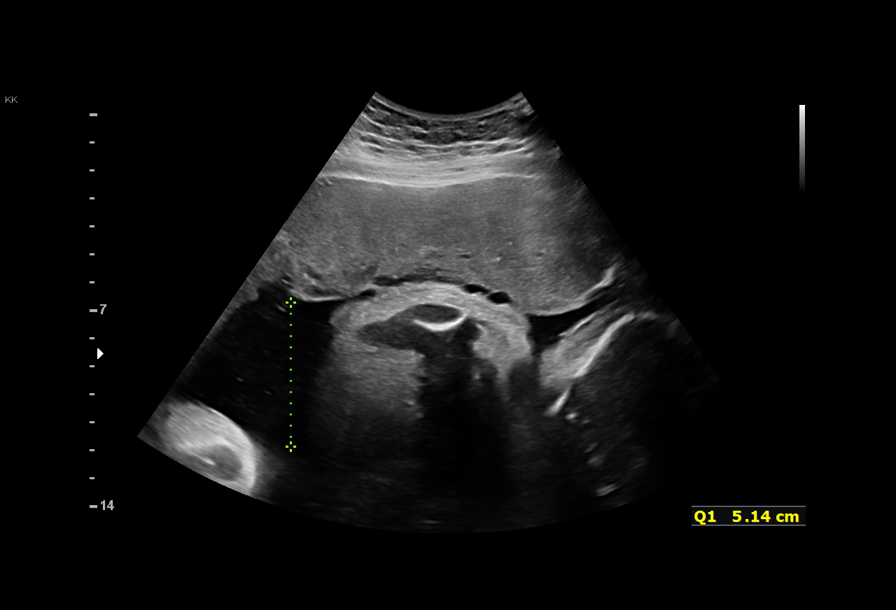
[im 12/25]
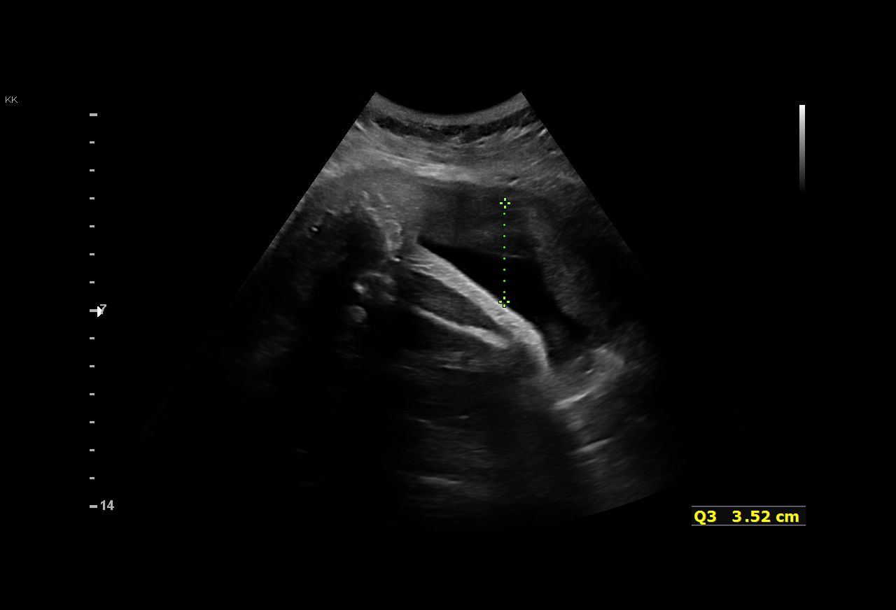
[im 14/25]
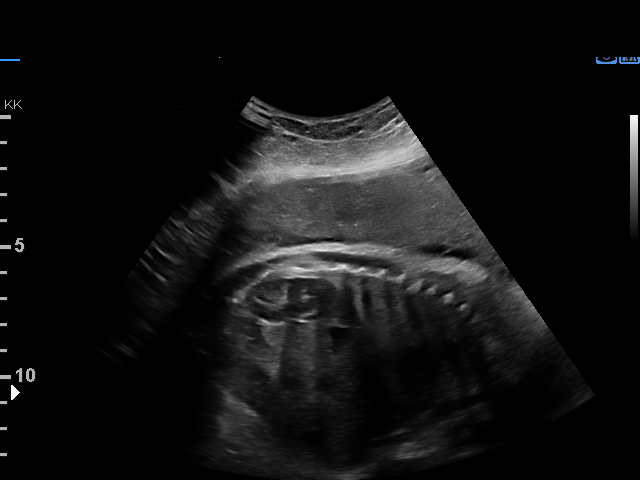
[im 16/25]
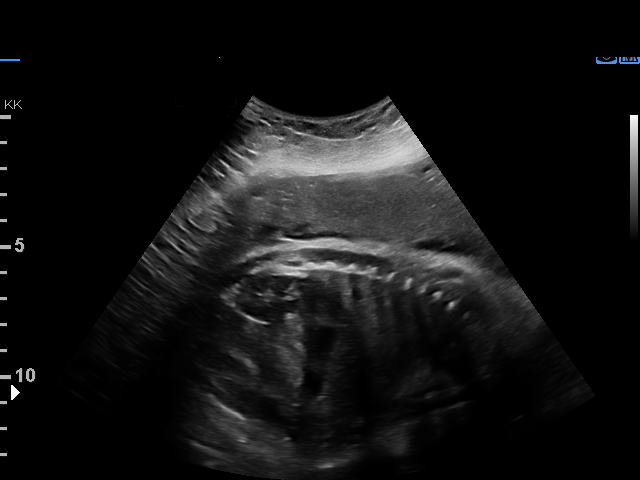
[im 18/25]
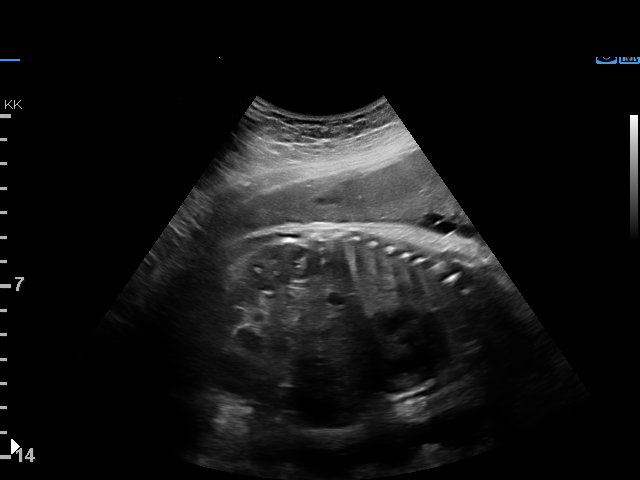
[im 20/25]
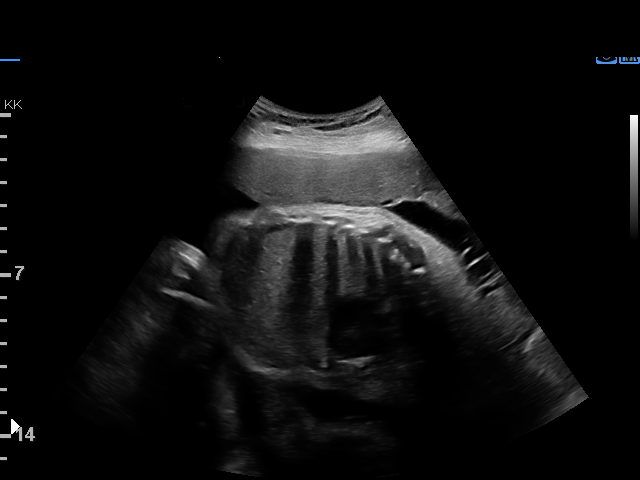
[im 22/25]
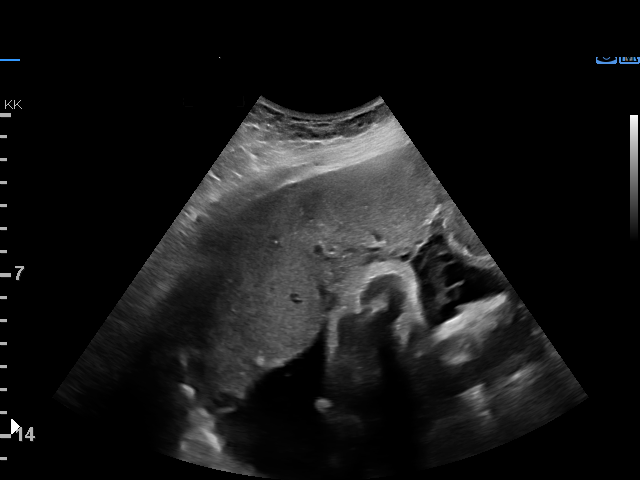
[im 24/25]
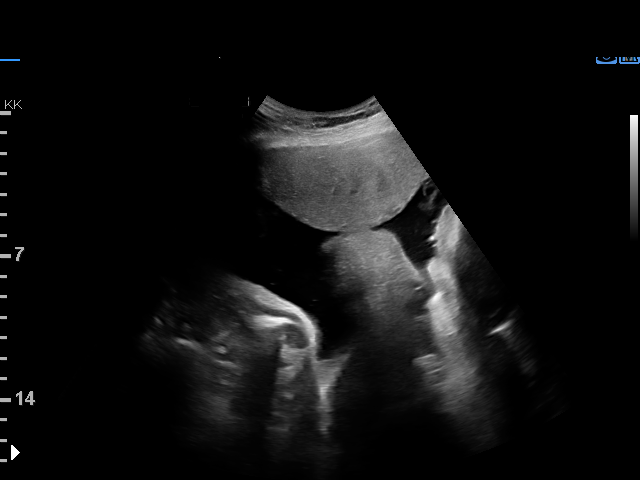

[12 of 25 positions shown; findings below may reference images not displayed]

Indications

 Abnormal biochemical screen (QUAD - AFP
 MoM 2.75)
 Pre-existing diabetes, type 2, in pregnancy,
 second trimester metformin  Lantus
 Pregnancy resulting from assisted
 reproductive technology
 Advanced maternal age primigravida 35+,
 third trimester(35 yrs)
 Uterine fibroids
 32 weeks gestation of pregnancy
 Large for gestational age fetus affecting
 management of mother
 Fetal ECHO Normal
Fetal Evaluation

 Num Of Fetuses:         1
 Fetal Heart Rate(bpm):  150
 Cardiac Activity:       Observed
 Presentation:           Cephalic
 Placenta:               Anterior
 P. Cord Insertion:      Previously Visualized

 Amniotic Fluid
 AFI FV:      Within normal limits

 AFI Sum(cm)     %Tile       Largest Pocket(cm)
 15.7            56
 RUQ(cm)       RLQ(cm)       LUQ(cm)        LLQ(cm)

Biophysical Evaluation

 Amniotic F.V:   Pocket => 2 cm             F. Tone:        Observed
 F. Movement:    Observed                   Score:          [DATE]
 F. Breathing:   Observed
OB History

 Gravidity:    1
Gestational Age

 LMP:           32w 1d        Date:  01/31/20                 EDD:   11/06/20
 Best:          32w 1d     Det. By:  LMP  (01/31/20)          EDD:   11/06/20
Anatomy

 Stomach:               Appears normal, left   Bladder:                Appears normal
                        sided
Impression

 For gestational diabetes.  Reportedly well controlled on
 insulin and Metformin.  Blood pressure today at her office is
 136/76 mmHg.

 Amniotic fluid is normal and good fetal activity is seen
 .Antenatal testing is reassuring. BPP [DATE].  Cephalic
 presentation.
Recommendations

 -Continue weekly BPP till delivery.
                 Goitsione, Kangandi

## 2021-03-09 IMAGING — US US MFM FETAL BPP W/O NON-STRESS
1 series · 15 of 23 positions shown · non-contrast
Comparison: none

[Series 1: us mfm fetal bpp w/o non-stress · 23 acquisitions, 15 frames shown]
[im 1/23]
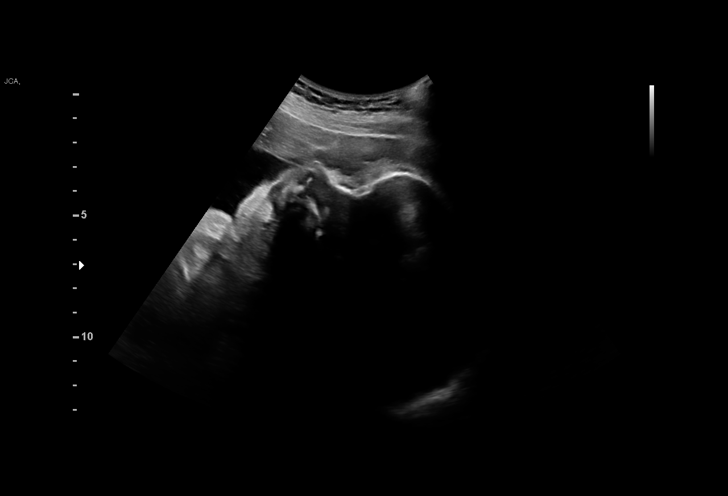
[im 3/23]
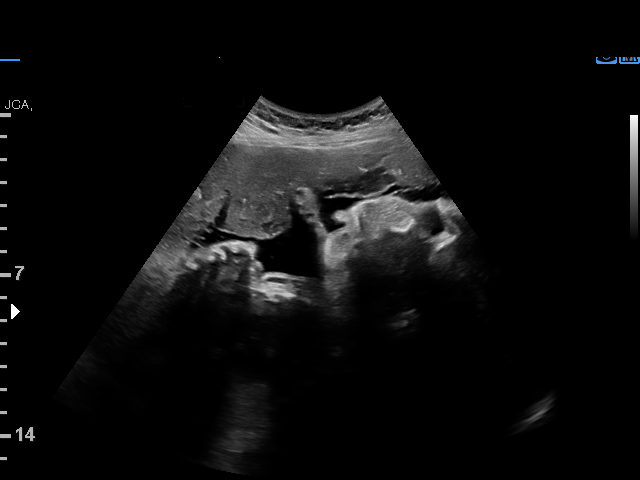
[im 4/23]
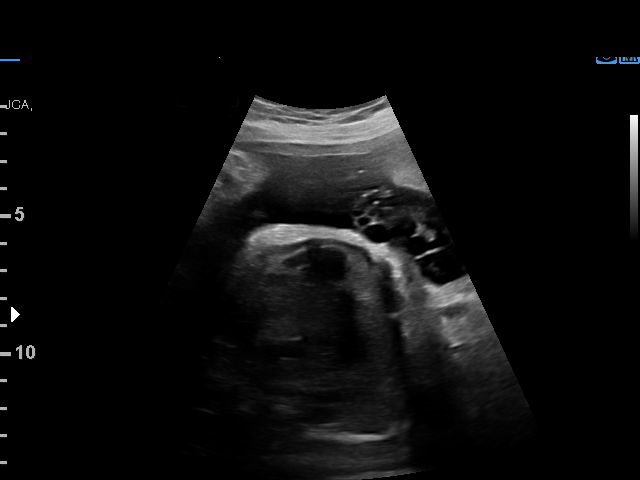
[im 6/23]
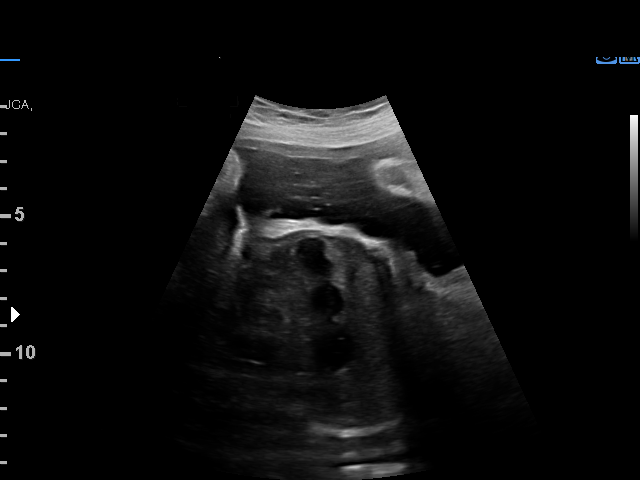
[im 7/23]
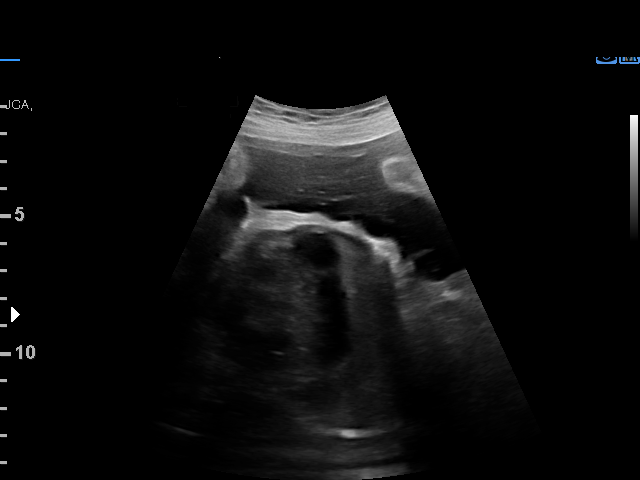
[im 9/23]
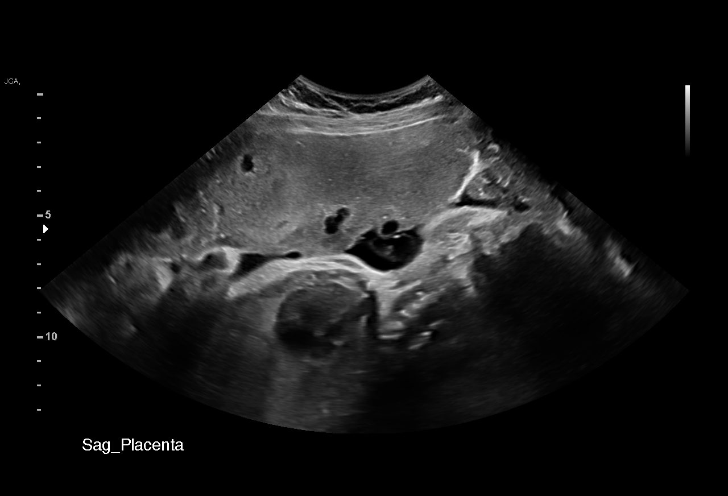
[im 10/23]
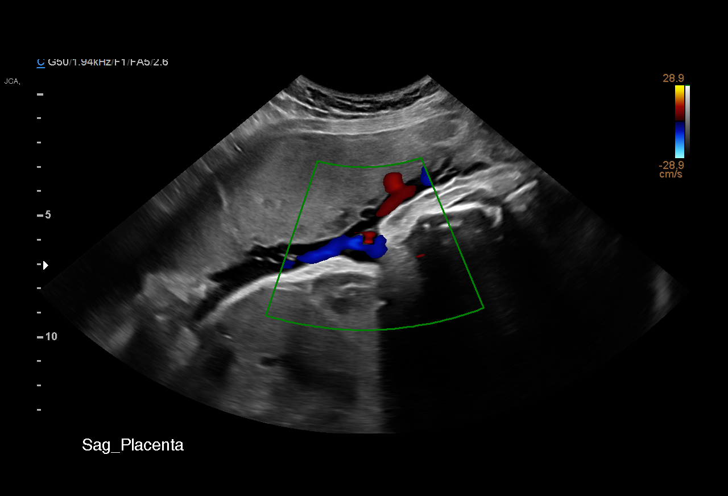
[im 12/23]
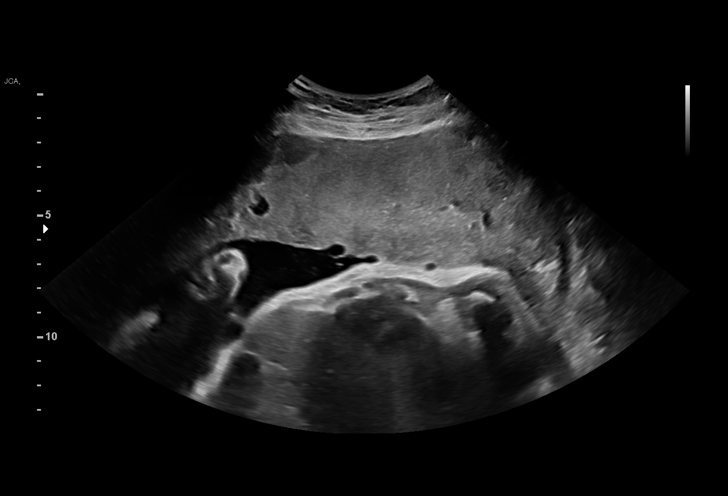
[im 14/23]
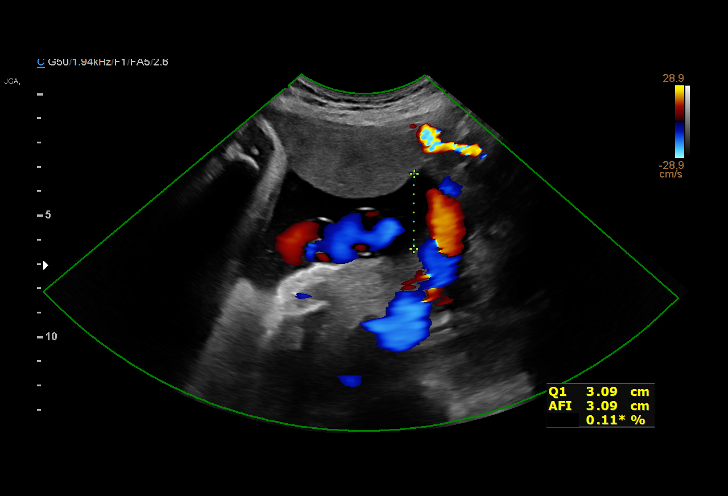
[im 15/23]
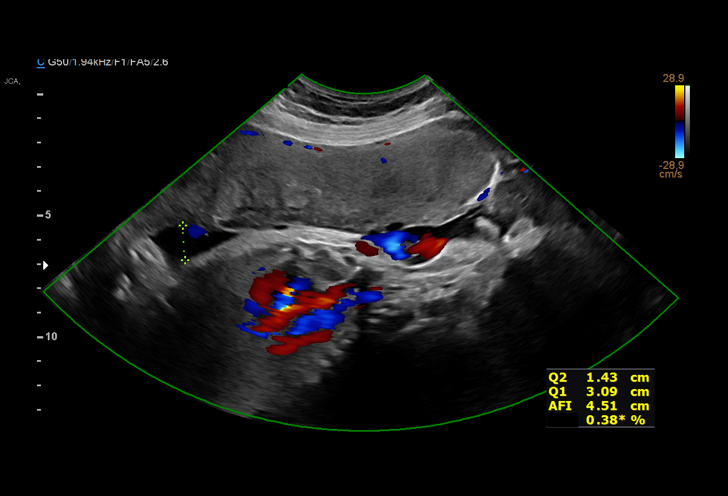
[im 17/23]
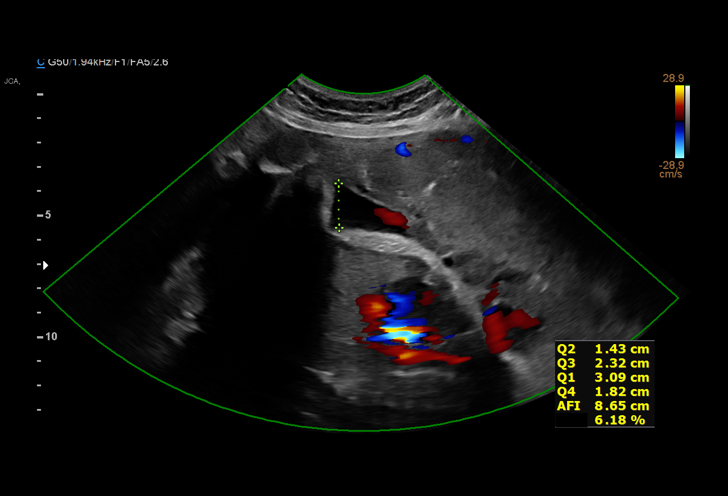
[im 18/23]
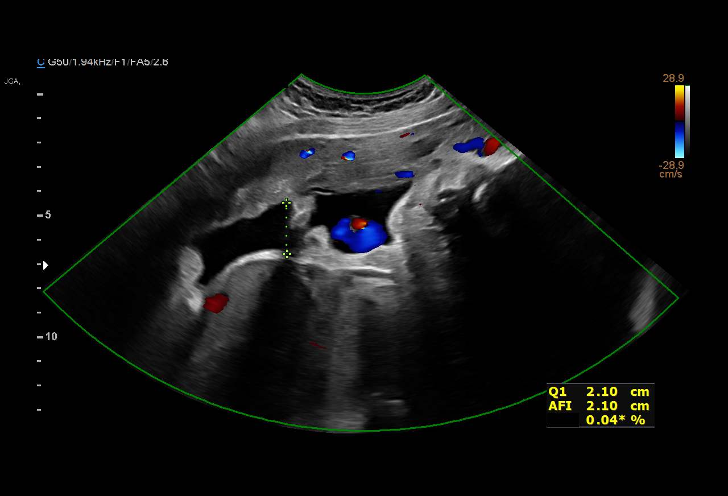
[im 20/23]
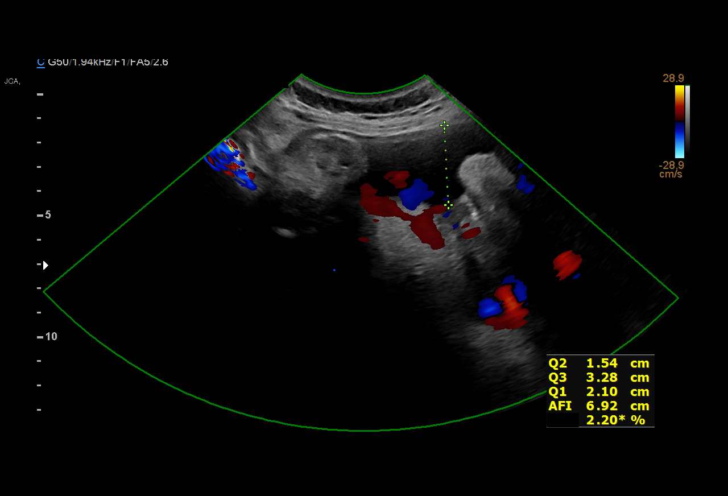
[im 21/23]
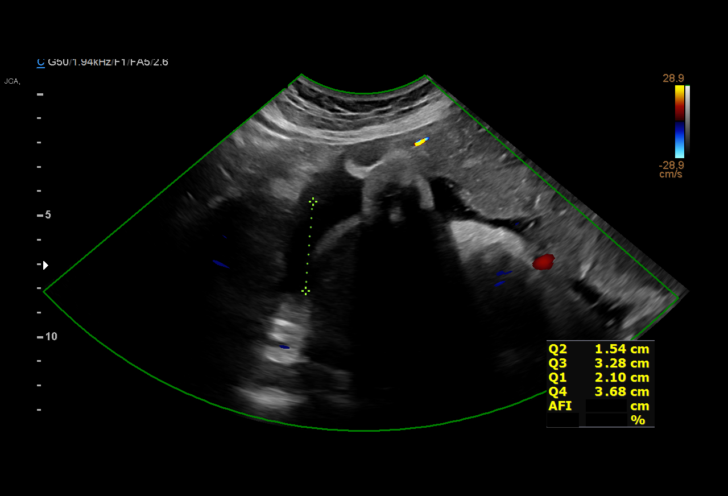
[im 23/23]
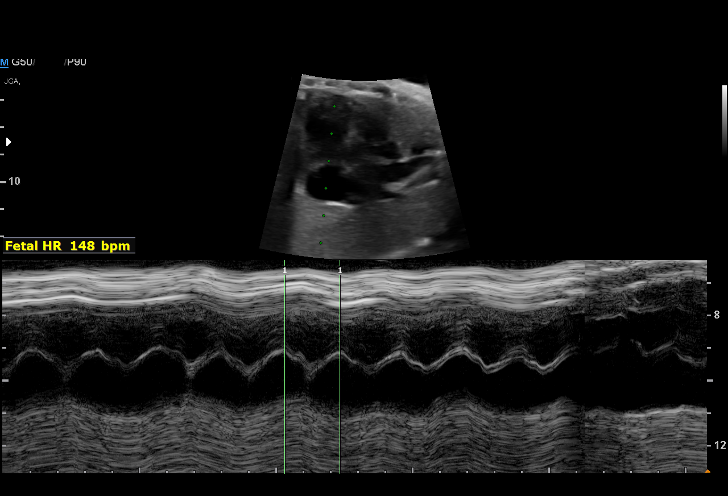

[15 of 23 positions shown; findings below may reference images not displayed]

Indications

 Abnormal biochemical screen (QUAD - AFP
 MoM 2.75)
 Pre-existing diabetes, type 2, in pregnancy,
 third trimester (metformin and Insulin)
 Pregnancy resulting from assisted
 reproductive technology
 Advanced maternal age primigravida 35+,
 third trimester(35 yrs)
 Uterine fibroids
 Large for gestational age fetus affecting
 management of mother
 Fetal ECHO Normal
 33 weeks gestation of pregnancy
Fetal Evaluation

 Num Of Fetuses:         1
 Fetal Heart Rate(bpm):  148
 Cardiac Activity:       Observed
 Presentation:           Cephalic
 Placenta:               Anterior
 P. Cord Insertion:      Previously Visualized

 Amniotic Fluid
 AFI FV:      Subjectively low-normal

 AFI Sum(cm)     %Tile       Largest Pocket(cm)
 9.63            15
 RUQ(cm)       RLQ(cm)       LUQ(cm)        LLQ(cm)

Biophysical Evaluation

 Amniotic F.V:   Pocket => 2 cm             F. Tone:        Observed
 F. Movement:    Observed                   Score:          [DATE]
 F. Breathing:   Observed
OB History

 Gravidity:    1
Gestational Age

 LMP:           33w 1d        Date:  01/31/20                 EDD:   11/06/20
 Best:          33w 1d     Det. By:  LMP  (01/31/20)          EDD:   11/06/20
Impression

 Pregestational diabetes. Patient takes metformin and insulin
 for control.

 Amniotic fluid is normal and good fetal activity is seen
 .Antenatal testing is reassuring. BPP [DATE].

 This study was remotely read .
Recommendations

 -Continue weekly BPP till delivery.
                 Mavil, Alexandre Gabriel

## 2021-03-14 ENCOUNTER — Other Ambulatory Visit: Payer: Self-pay

## 2021-03-23 ENCOUNTER — Ambulatory Visit (INDEPENDENT_AMBULATORY_CARE_PROVIDER_SITE_OTHER): Payer: Medicaid Other | Admitting: Nurse Practitioner

## 2021-03-23 ENCOUNTER — Other Ambulatory Visit: Payer: Self-pay

## 2021-03-23 ENCOUNTER — Encounter: Payer: Self-pay | Admitting: Nurse Practitioner

## 2021-03-23 VITALS — BP 125/83 | HR 87 | Temp 97.9°F | Ht 67.0 in | Wt 153.0 lb

## 2021-03-23 DIAGNOSIS — Z794 Long term (current) use of insulin: Secondary | ICD-10-CM

## 2021-03-23 DIAGNOSIS — E1165 Type 2 diabetes mellitus with hyperglycemia: Secondary | ICD-10-CM

## 2021-03-23 DIAGNOSIS — E119 Type 2 diabetes mellitus without complications: Secondary | ICD-10-CM | POA: Diagnosis not present

## 2021-03-23 LAB — POCT URINALYSIS DIP (CLINITEK)
Bilirubin, UA: NEGATIVE
Blood, UA: NEGATIVE
Glucose, UA: 250 mg/dL — AB
Ketones, POC UA: NEGATIVE mg/dL
Leukocytes, UA: NEGATIVE
Nitrite, UA: NEGATIVE
POC PROTEIN,UA: NEGATIVE
Spec Grav, UA: >=1.03 — AB (ref 1.010–1.025)
Urobilinogen, UA: 0.2 E.U./dL
pH, UA: 5.5 (ref 5.0–8.0)

## 2021-03-23 LAB — GLUCOSE, POCT (MANUAL RESULT ENTRY): POC Glucose: 247 mg/dl — AB (ref 70–99)

## 2021-03-23 LAB — POCT GLYCOSYLATED HEMOGLOBIN (HGB A1C)
HbA1c POC (<> result, manual entry): 9.1 % (ref 4.0–5.6)
HbA1c, POC (controlled diabetic range): 9.1 % — AB (ref 0.0–7.0)
HbA1c, POC (prediabetic range): 9.1 % — AB (ref 5.7–6.4)
Hemoglobin A1C: 9.1 % — AB (ref 4.0–5.6)

## 2021-03-23 MED ORDER — INSULIN GLARGINE 100 UNIT/ML SOLOSTAR PEN
20.0000 [IU] | PEN_INJECTOR | Freq: Two times a day (BID) | SUBCUTANEOUS | 11 refills | Status: DC
  Filled 2021-03-23: qty 12, 30d supply, fill #0
  Filled 2021-05-03: qty 12, 30d supply, fill #1
  Filled 2021-06-12: qty 12, 30d supply, fill #2
  Filled 2021-07-17: qty 12, 30d supply, fill #3

## 2021-03-23 MED ORDER — INSULIN PEN NEEDLE 31G X 5 MM MISC
1.0000 "pen " | Freq: Two times a day (BID) | 11 refills | Status: AC
  Filled 2021-03-23: qty 100, 25d supply, fill #0
  Filled 2021-05-03: qty 100, 25d supply, fill #1
  Filled 2021-06-12 – 2021-07-17 (×3): qty 100, 25d supply, fill #2
  Filled 2021-08-20: qty 100, 25d supply, fill #0

## 2021-03-23 NOTE — Patient Instructions (Addendum)
??????? ?????? ?? ??? ?????? Diabetes [facility] [employer] ??? ??????? ?? ??? ?????? ???? ???? ????? (????). ???? ??????? ??? ?? ?????? ?????? ?????? (????????) ?????? ?? ??? ?????? ???? ????? ??? ?????. ??? ???? ??? ?????? ???? ????? ????? ??? ??? ?? ????????? ????: ??? ????? ????????? ?? ???? ?? ????? ???? ?????????. ??? ??????? ????? ?????? ?????? ????????? ???? ?????. ???? ????????? ???????? ??????? ??? ??????? ?? ?????. ?????? ?? ??? ?? ????? ??????. ??? ??? ??? ?????? ???????? ??? ???????? ?? ????? ?????? ??? ???????. ?????? ??? ??? ????? ???????? ?? ???? (??? ??? ????). ???? ??? ?????? ???????? ???? ?? ????? ??? ??????? ????????? ?? ???? ??? ?????? ?????? ?????? ??? ????? ???????? ?? ????. ???? ??? ?? ???? ??????? ?????? ??????? ?????????? ??????? ????? ??? ???? ??????????. ??? ????? ??? ??? ???? ????????? ???? ???? ???? ??? ????? ??? ?????? ???? ???????. ?????? ??? ???? ???? ???????? ?? ???? ???????? ???? ????? ???????? ??? ??????? ???? ??????? ?????? ??????? ??. ???????? ????? ?????? ??? ?????? ?????? ???? ???????? ?? ???? ?? ???????. ???? ???? ??? ?? ???? ???? ???????? ???? ?? ?????? ???????: ??? ??????? (??? ?????): 80-130 ???/??????? (4.4-7.2 ??? ???/???). ??? ??????? (??? ?????): ??? ?? 180 ???/??????? (10 ??? ???/???). ????? ?????????? A1C? (HbA1c): ??? ?? 7%. ????? ????? ??????? ?????? ????? ?????? ???? ?????? ?????. ????? ????? ?????? ????????. ???? ??? ???. ???? ????????? ??????? ?? ???????: ????? ???? ?????? ????? ????? ?????? ???????? ?? ??? ??????? ???? ??????? ?????? ??????? ??. ???? ????? ?????? ???? ???????? ???: ??? ??????: _____________________ ?????? (??????): _________________ ????? (??????/?????): _______________ ???????: ___________________________________ ??? ??????: _____________________ ?????? (??????): _________________ ????? (??????/?????): _______________ ???????: ___________________________________ ??? ??????: _____________________ ?????? (??????):  _________________ ????? (??????/?????): _______________ ???????: ___________________________________ ????????? ??? ??? ?????? ?????????? ????? ????? ????????? ???? ???????? ???: ??? ?????????: ________________ ?????? (??????): _________________ ????? (??????/?????): _______________???????: ___________________________________ ??? ?????????: ________________ ?????? (??????): _________________ ????? (??????/?????): _______________ ???????: ___________________________________ ??? ?????????: ________________ ?????? (??????): _________________ ????? (??????/?????): _______________ ???????: ___________________________________ ??? ?????????: ________________ ?????? (??????): _________________ ????? (??????/?????): _______________ ???????: ___________________________________ ??? ?????????: ________________ ?????? (??????): _________________ ????? (??????/?????): _______________ ???????: ___________________________________ ??????? ??? ???? ???????? ?? ???? ???? ??? ???? ???? ???????? ?? ???? ???????? ???? ???? ???????? ??? ??????? ???? ??????? ?????? ??????? ??. ???? ????? ???? ??? ??? ???? ???????? ???????? ???: ?????: _______________ ???????: ___________________________________ ?????: _______________ ???????: ___________________________________ ?????: _______________ ???????: ___________________________________ ?????: _______________ ???????: ___________________________________ ?????: _______________ ???????: ___________________________________ ?????: _______________ ???????: ___________________________________  ?????? ???????? ?? ???? ??????? ??????? ???????? ?? ???? (??? ??? ????) ???? ???? ?????? ???? ???? ??? 70 ???/??????? (3.9 ??? ???/???) ?? ???. ????? ???? ??????? ?? ???: ?????? ??? ???: ?????. ????? ????????. ????????? ?? ????? ????? ???????. ??????. ??????. ?? ????? ??: ????? ??? ?????. ??????. ???? ?? ????? ??? ???????. ????? ??? ???? ?? ??????? ?? ??????. ???????? ?? ??????  ??: ?????? (????? ??????). ?????. ???? ?????? ???? ???????? ?? ???? ????? ?????? ???????? ?? ????? ???? ?????? ?? ???? ?? ????? ????? ??? ????? ?????. ???? ??? ?????? ??????? ????? ?????? ?????? ????? ????? 15:15: ????? 15 ?????? ?? ???????????? ????? ???????? ??? ??????? ???? ??????? ?????? ??????? ??. ??? ???????????? ????? ???????: ????? ????????: ????? 3-4 ?????. ??? ?????? ??????: ????? 3-5 ???. ???? ???????: ???? 4 ?????? (120 ??). ?????? ??????? ??????? (????? ??????? ?????? ????????)? 4-6 ?????? (120-180 ??). ??? ????? ?? ?????: ????? ????? ????? (15 ??). ??? ???? ???? ???????? ?? ????? ??? 15 ????? ?? ????? ????????????. ????? 15 ?????? ???? ?? ???????????? ??? ???? ???? ???????? ?? ???? ?? ???? ?? ????? ??  70 ???/??????? (3.9 ??? ???/???). ??? ?? ????? ???? ???????? ?? 70 ???/??????? (3.9 ??????/???) ??? 3 ???????? ????? ???????? ?????. ??? ???? ???? ???????? ??? ?????? ????????? ????? ???? ????? ?? ???? ????? ???? ???? ?????. ???? ???????? ?????? ?? ???? ???????? ?? ???? ????? ??? ???? ???????? ???? ??? 54 ???/??????? (3 ??????/???) ?? ???? ???? ???? ??? ????? ?? ?????? ???? ?? ???? ???????? ?? ???? (??? ???? ?? ????? ?? ????). ??? ???? ?????. ??? ?? ????? ?? ??? ???? ??????? ????? ?? ??. ???? ??? ???????? ?????? ??? ?????. ???? ?????? ??????? ??????? (911 ?? ???????? ??????? ?????????). ?? ??? ??????? ????? ??? ????????. ???? ??? ??? ??????? ?????? ??? ???? ??????? ?????? ?? ??? ??? ????????? ?????? ?? ????? ???? ??????? ?? ??????? ???? ?????? ????? ??????? ????? ?? ??????? ?? ????? ?????? ???? ????? ?????? ??? ??? ?? ????????? ?? ??? ????? ??? ???? ???? ???????? ?? ????? ?? ??? ?????? ???? ?????? ??????? ?? ???? ?? ??? ?????? ?? ???? ????? ???????? ??????? ??? ??? ?????? ?????? ??? ?????? ??? ????? ??????? ????? ?????? ??? ?????? ?? ?????????: ??????? ????????? ?????? (American Diabetes Association):? [email or web address] ????? ?????? ????? ???? ?????? ???????? (Association of  Diabetes Care and Education Specialists): [email or web address] ???? ????? ??????? ?????? ?? ??????? ???????: ???? ????? ???????? ???? ??? 240 ???/??????? (13.3 ??? ???/ ???) ?? ???? ???? ????? ????????. ??????? ?????? ?? ?????? ???????? ??? ????? ?? ????? ??? ????. ??????? ????? ???????? ???????? ?????????? ????? ?? 6 ?????: ??? ????? ??? ????? ?? ?????. ?????? ??????. ??????? ???????. ??????? ????????. ???? ???????? ????? ?? ??????? ???????: ?????? ???? ???????? ?? ??? ??? ??? ?? 54 ????/ ??????? (3 ??????/???). ?????? ????? ?????. ???? ????? ?? ??????? ?????. ??????? ?????? ?? ??????. ???? ???? ??? ??????? ??? ????? ????? ????? ???? ???? ?????. ??? ?? ????? ?? ??? ???? ??????? ????? ?? ??. ???? ??? ???????? ?????? ??? ?????. ???? ?????? ??????? ??????? (911 ?? ???????? ??????? ?????????). ?? ??? ??????? ????? ??? ????????. ???? ??? ?????? ???? ???? ???? ????? ?? ?????? ?????? ??? ????? ?????? (????????) ?????? ?? ??? ??????. ?????? ????????? ?????? ????? ?????? ??? ?????????. ???? ??? ???? ???? ???????? ?? ????? ?????? ??? ?????????. ????? ????? ?????? ????????. ???? ??? ???. ??? ????? ?? ??? ????????? ?? ???? ?????? ????????? ???? ?????? ???? ??????? ??????. ???? ?? ?????? ??? ????? ???? ?? ???? ?? ???? ??????? ??????.? Document Revised: 12/29/2019 Document Reviewed: 12/29/2019 [employer] Patient Education  2022 [place]. Insulin Glargine Injection ?? ??? ??????? ??????? ??????? INSULIN GLARGINE ?? ??? ????? ????????? ???????. ???? ??? ?????? ??? ????? ???? ????? ?? ???. ?? ??????? ???? ??????? ?????? ?? ??? ?????? ??? ????? ??????. ???? ??????? ??? ?????? ?????? ????? ???? ???? ??????? ?????? ?? ??????? ??? ???? ???? ?????. ????? ???????? ???????? ????????:? BASAGLAR, Lantus, Lantus SoloStar, Semglee, Toujeo Max SoloStar, Foot Locker ?? ?? ??????? ???? ??? ?? ???? ??? ???? ??????? ?????? ??? ????? ??? ??????? ?? ????? ??? ????? ?? ??? ??? ?????? ??? ?? ??? ???????  ????: ????? ??? ????? ?? ???? ????? ?????? ????? ?????? ????? ????? ????? ????? ?? ??? ??? ???? ?? ?????? ?? ????????? ?? ?????????? ?? ??? ??? ???? ?? ?????? ?? ??????? ?????? ?? ??????? ?? ??????? ?? ?????? ??????? ???? ?? ????? ????? ??????? ???????? ??? ??? ???? ??????? ??? ??????? ??? ?????? ???? ????? ??? ?????. ?????? ??? ?????? ?? ??? ????? ?? ???. ?????? ??? ?? ????? ??????. ??? ??? ??? ??? ????????? ?????? ?? ??? ??????? ?? ????? ????????? ?????? ??? ?????. ??? ??? ???? ???? ??? ?????????. ???? ?????? ????? ??????? ??? ?????? ?????? ????? ??????? ?? ??????? ??????. ?? ?????? ??????? ?????? ?? ???????. ?? ?????? ???? ??? ????????? ??? ????????. ????? ?? ???? ??? ?????? ?????? ???? ????? ??? ?????. ?? ?????? ??? ?????? ??? ??? ?????? ?? ?????? ?? ?????? ?? ??? ???? ?????? ???? ??????. ??? ??? ?????? ??? ???????? ????? ?? ??? ???? ?????? ??????? ??? ??????? ??????. ?? ????? ?? ??? ????? ?????? ????????? ?? ???? ???? ?????? ?? ??????? ??????. ?? ????? ?? ????? ?????. ??? ?? ??? ???? ???? ??????? ??????? ???? ??????? ?? ?????? ??????? ?????? ?????? ??? ?????. ???? ??? ?????? ?? ??????? ?????????. ???? ??????? ?? ??????? ??? ????? ??????? ??? ??????. ???? ?? ????? ??? ????????? ??????. ???? ??? ??????? ?? ?????? ??????? ?????? ??? ???? ???? ?????????. ???? ??? ???? ??????? ???? ??????? ??? ?????? ???????. ?? ??? ?? ??? ?????? ???? ?? ???? ??????? ????? ?????? ?? ?????  6 ????? ????? ????? ??????? ???????? ??? ??? ???? ????? ?????????? ???????. ?????? ??????? : ??? ?????? ??? ?????? ???? ????? ???? ?? ??? ??????? ???? ????? ?????? ?? ?????? ?? ???? ??????? ?????. ??????: ?????? ??? ?????? ?? ???? ??? ???. ?? ????? ????? ??? ?? ????? ??? ??????. ???? ?? ???? (????) ????? ?? ????? ??? ???? ????. ????? ??? ????? ?? ?????? ??????? ?????? ?????? ??? ??? ?? ??? ??????? ???????. ??? ???? ????? ???? ?????. ?? ?????? ?????? ?? ???? ?????. ?? ?? ??????? ???? ?? ?????? ?? ??? ??????? ????? ???? ????  ????? ?? ???? ??? ????? ?????? ?? ??? ????? ??? ??? ???????: ????????? ???? ????? ??? ?????? ????? ???? ???? ????? ??? ??????? ??????? ?? ?????? ?????? ???????? ???????? ???????? ??? ????? ??? ???? ??????? ?? ????? ????? ?? ??? ?????? ??? ???? ????? ?????? ????? ????? ??????? ????? ??? ???????????? ?? ?????????????? ?????? ??? ?????. ?????????? ???????????? ??????????? ????????? ??????? ??????? ?? ???????? ???????? ??????? ???? ???? ????? ???????? ??????? ??????? MAOI ??? ?????? ??????? ???????? ????????? ???????. ????? ????? ????? ????? ???????? ?? ????? ?? ????? ????? ?? ?????? ????? ???? ???????? ?? ????? ?? ?????????? ??????? ?????? ????????? ??????? ??? ?????????? ??????? ?????????? NSAIDs? ????? ????? ????? ?? ?????????? ??? ?????????? ?????????? ??????????? ??????????? ????????? ???????? ?????????? ?????? ?????????? ??????? ??? ?????????????? ?? ????????????? ?? ?????????? ??? ?????? ?????? ??????? ??????? ?????????? ??? ????????? ?? ?????????? ??????????- ?????????????? ??????? ????? ??????? ??? ??????? ???? ???? ?? ???? ????? ??????? ?? ??? ???? ??????? ?? ????? ??? ??? ????? ????? ????? ????? ??? ??? ????? ??????? ???? ??? ??? ?????? ???? ??? ???????. ???? ??? ???????: ?????? ??????? ????????? ?? ?????? ????? ??? ???? ??????? (???? ??????? ???????? ?? ?????????? ?? ???????????) ????????? ?????????? ??????? ??? ??????? ?? ?? ??? ?? ????????? ????????. ?? ?????? ???? ??????? ?????? ????? ??? ??????? ?? ??????? ?? ??????? ???? ???? ?? ???????? ???????? ???? ????????. ?????? ????? ??? ??? ???? ?? ???? ?????? ?? ?????? ?????? ??? ???????. ?? ?????? ??? ??????? ?? ?????. ?? ???? ??? ???? ????? ??? ??????? ??? ??????? ?? ?????? ????? ?? ?????? ??????? ?????? ??????? ????? ???????. ?? ???? ??????? ?? ?????? ???? ?? ???? ?? ??? ????? ??? ???? ????? ??? ???? ??? ???? ???? ??????. ?? ?????? ?????? ?? ????? ??? ??????. ???? ????????? ????????. ???? ?????? ????? ?????? ????  ??????? ???????????? ????????????? (A1C). ??? ?????? ?? ????. ???? ??? ?????? ?? ????? ??? ???? ???? ???? ??????? ??? 3 ???? ????????. ????? ???? ???????? ?? 3 ??? 6 ????. ???? ??? ???? ????? ??? ???? ????. ???? ????? ??? ???? ??????? ???????? ?????? ??????? ????. ???? ????? ???? ???? ????? ??? ?? ???? ?????? ?????? ?????? ??? ????. ?? ????? ??? ??????? ???? ????? ?????? ?? ????? ????????. ????? ?? ?? ??????? ?????? ???? ?? ?????? ?? ????? ??? ???? ?? ???? ???? ?????? ?????? ????? ??????? ??? ????? ??? ??????? ?? ????? ?????. ??? ?? ?????? ???????? ?????? ????. ???? ????? ?? ?????? ??????? ?????? ??? ????? ??????? ?? ??? ????. ?? ????? ??? ????? ???? ??? ??????. ??? ???? ???????? ?? ????? ?????? ???? ?? ???????? ??? ????? ??? ????? ???? ??? ??????. ?? ???? ?????. ???? ????? ?? ?????? ??????? ?????? ??? ??? ????? ???? ???? ??????. ????? ?????? ?? ?????? ?????? ?????? ??????? ???? ???? ??? ????? ?? ??????. ?? ???? ??? ??? ????? ??? ???? ????. ???? ?? ???? ????? ?????? ?? ????? ???? ????????? ???? ???????. ???? ??? ???? ????? ???? ????????? ?? ?????? ??? ??? ????? ????? ?? ?????? ??????? ?????? ??? ???. ??????? ??? ?????? ?? ????? ????????? ???? ?? ???? ?????? ?? ?????? ??? ???? ????? ?????. ????? ????? ????? ????? ?? ????????? ?????? ?????? ?? ?????. ?????? ?????? ??? ????? ???. ??? ?????? ??????? ?? ???? ???? ???? ????? ?????? ?? ???? ?????. ??? ??? ??? ?????? ????? ??????? ????????? ??? ???????. ??? ??? ?? ????? ??????? ?? ????? ???????? ?? ??? ????????? ??? ????? ???????? ?????? ?? ????? ??? ??????? ???????. ?? ?? ??? ???? ??? ??? ???? ????? ?? ??? ?????? ???? ??? ??? ???? ?? ????? ???? ???? ?? ?????? ??? ????????. ??? ?? ??? ????? ????? ???? ?? ????? ?? ?????? ??????? ?????? ????? ????? ??????? ??? ????? ?????? ????? ????. ????? ????? ?? ????? ????? ????. ???? ????? ??????? ????? ???? ????? ??? ?????? ????? ???????? ???? ?????. ?? ?? ?????? ???????? ???? ???? ?? ??????? ??? ???? ??? ??????? ??????  ???????? ???? ??? ?? ???? ????? ?? ?????? ??????? ?????? ??? ?? ???? ??? ????: ???????? ??? ????? ?????? ?????? ??????????? (?????) ???? ????? ?? ?????? ?? ??????. ????? ?????? ?????? ?????? ?????? ??? ???? ??? ??????? ???? ????? ???? ?????? ????? ??? ????? ????????? ???????? ???? ?????? ????? ?? ????? ????????? ?????? ?????? ?????? ?????? ?????? ??? ???? ??? ?????? ??????? ????????? ??????? ????? ????????? ????? ?? ????? ??? ???????? ??????? ?????? ?? ?????? ????? ?????? ?????? ?????? ??????? ?????? ????????? ????? ????? ?????? ????? ????? ?????? ???????? ???? ?? ????? ????? ???? (???? ????? ?? ?????? ??????? ?????? ??? ?????? ?? ???? ????? ?????): ????? ?? ??? ??????? ??????? ??? ????? ????? ???? ??????? ???? ??? ???? ??? ?? ???? ?? ??? ?? ??? ?? ???? ????? ??? ??????? ?? ?? ??? ?? ?????? ???????? ????????. ???? ?????? ????????? ?????? ?? ?????? ????????. ????? ??????? ?? ?????? ???????? ?????? ??????? ???????? ????????? (  FDA) ??? ????? ?.[id number]??? ??? ??? ???? ???????? ??????? ???? ?????? ?? ?????? ???????. ???????? ??? ????????: ?????? ??????: ???? ?? ????? ??? ????? ????? 2 ?8 ???? ????? (36 ?46 ???? ????????) ?? ?? ???? ????? ?????? ??? ?? 30 ???? ????? (86 ???? ????????). ?? ???? ????????? ?? ??????? ??? ??? ??????. ???? ?????? ?? ??????? ?????? ????????. ??? ?????? ?? ???????? ??? 28 ????? ??? ?? ??????? ?? ???? ????? ??????. ???? ?? ?? ???? ??? ????? ???? ?????? ?? ?????? ?? ??????? ??? ????? ?????? ????????. ??????? ??? ????????: ????? ???????? ???? ??: ???? ?? ????? ??? ????? ????? 2 ?8 ???? ????? (36 ?46 ???? ????????) ?? ?? ???? ????? ?????? ??? ?? 30 ???? ????? (86 ???? ????????). ?? ???? ????????? ?? ??????? ??? ??? ??????. ???? ?????? ?? ??????? ?????? ????????. ??? ?????? ?? ????? ??? 28 ????? ??? ?? ?????? ?? ???? ????? ??????. ???? ?? ?? ???? ??? ????? ???? ?????? ?? ?????? ?? ??????? ??? ????? ?????? ????????. ????? ?????? ????????: ???? ?? ????? ??? ????? ????? 2 ?8 ????  ????? (36 ?46 ???? ????????) ?? ?? ???? ????? ?????? ??? ?? 30 ???? ????? (86 ???? ????????). ?? ???? ????????? ?? ??????? ??? ??? ??????. ???? ?????? ?? ??????? ?????? ????????. ??? ?????? ?? ????? ??? 28 ????? ??? ?? ?????? ?? ???? ????? ??????. ???? ?? ?? ???? ??? ????? ???? ?????? ?? ?????? ?? ??????? ??? ????? ?????? ????????. ????? ??????: ???? ?? ????? ??? ????? ????? 2 ?8 ???? ????? (36 ?46 ???? ????????) ?? ?? ???? ????? ?????? ??? ?? 30 ???? ????? (86 ???? ????????). ?? ???? ????????? ?? ??????? ??? ??? ??????. ???? ?????? ?? ??????? ?????? ????????. ??? ?????? ?? ????? ??? 28 ????? ??? ?? ?????? ?? ???? ????? ??????. ???? ?? ?? ???? ??? ????? ???? ?????? ?? ?????? ?? ??????? ??? ????? ?????? ????????. ????? ????? ???????? ?? ????? ????? ???? ????????: ???? ?? ??????? ??? 2 ?8 ????? ????? (36 ?46 ????????). ?? ???? ????????? ?? ??????? ??? ??? ??????. ???? ?????? ?? ??????? ?????? ????????. ???? ?? ?? ???? ??? ????? ???? ?????? ?? ?????? ?? ??????? ??? ????? ?????? ????????. ???????? ???? ????????: ?????? ??????: ???? ?? ????? ?? ?? ???? ????? ?????? ??? ?? 30 ???? ????? (86 ????????). ?? ??????. ???? ?????? ?? ??????? ??????. ???? ?? ???????? ???????? ??? 28 ?????. ?????? ??????: ???? ?? ????? ?? ?? ???? ????? ?????? ??? ?? 30 ???? ????? (86 ????????). ?? ??????. ???? ?????? ?? ??????? ??????. ???? ?? ???????? ???????? ??? 28 ?????. ??????? ???? ????????: ????? ???????? ???? ??: ???? ?? ???? ????? ?????? ??? ?? 30 ???? ????? (86 ????????). ?? ??? ?? ??????? ?? ????. ???? ?????? ?? ??????? ??????. ???? ?? ????? ??? 28 ?????? ??? ??? ??? ?? ???? ????? ??? ???????. ????? ?????? ????????: ???? ?? ???? ????? ?????? ??? ?? 30 ???? ????? (86 ????????). ?? ??? ?? ??????? ?? ????. ???? ?????? ?? ??????? ??????. ???? ?? ????? ??? 28 ?????? ??? ??? ??? ?? ???? ????? ??? ???????. ????? ??????: ???? ?? ???? ????? ?????? ??? ?? 30 ???? ????? (86 ????????). ?? ??? ?? ??????? ?? ????. ???? ?????? ?? ???????  ??????. ???? ?? ????? ??? 28 ?????? ??? ??? ??? ?? ???? ????? ??? ???????. ????? ????? ???????? ?? ????? ????? ???? ????????: ???? ?? ???? ????? ?????? ??? ?? 30 ???? ????? (86 ????????). ?? ??? ?? ??????? ?? ????. ???? ?????? ?? ??????? ??????. ???? ?? ????? ??? 56 ?????? ??? ??? ??? ?? ???? ????? ??? ???????. ??????: ??? ??????? ????? ?? ????: ?? ?? ???? ??? ???????? ?? ????????? ???????. ??? ???? ???? ?? ????? ?? ??? ??????? ????? ??? ?????? ?? ??????? ?? ???? ??????? ??????.  2022 [employer]/Gold Standard (2019-11-04 00:00:00) Diabetes Mellitus and Nutrition, Adult When you have diabetes, or diabetes mellitus, it is very important to have healthy eating habits because your blood sugar (glucose) levels are greatly affected by what you eat and drink. Eating healthy foods in the right amounts, at about the same times every day, can help you: Control your blood glucose. Lower your risk of heart disease. Improve your blood pressure. Reach or maintain a healthy weight. What can affect my meal plan? Every person with diabetes is different, and each person has different needs for a meal plan. Your health care provider may recommend that you work with a dietitian to make a meal plan that is best for you. Your meal plan may vary depending on factors such as: The calories you need. The medicines you take. Your weight. Your blood glucose, blood pressure, and cholesterol levels. Your activity level. Other health conditions you have, such as heart or kidney disease. How do carbohydrates affect me? Carbohydrates, also called carbs, affect your blood glucose level more than any other type of food. Eating carbs naturally raises the amount of glucose in your blood. Carb counting is a method for keeping track of how many carbs you eat. Counting carbs is important to keep your blood glucose at a healthy level, especially if you use insulin or take certain oral diabetes medicines. It is important to know how many  carbs you can safely have in each meal. This is different for every person. Your dietitian can help you calculate how many carbs you should have at each meal and for each snack. How does alcohol affect me? Alcohol can cause a sudden decrease in blood glucose (hypoglycemia), especially if you use insulin or take certain oral diabetes medicines. Hypoglycemia can be a life-threatening condition. Symptoms of hypoglycemia, such as sleepiness, dizziness, and confusion, are similar to symptoms of having too much alcohol. Do not drink alcohol if: Your health care provider tells you not to drink. You are pregnant, may be pregnant, or are planning to become pregnant. If you drink alcohol: Do not drink on an empty stomach. Limit how much you use to: 0-1 drink a day for women. 0-2 drinks a day for men. Be aware of how much alcohol is in your drink. In the [place] one drink equals one 12 oz bottle of beer (355 mL), one 5 oz glass of wine (148 mL), or one 1 oz glass of hard liquor (44 mL). Keep yourself hydrated with water, diet soda, or unsweetened iced tea. Keep in mind that regular soda, juice, and other mixers may contain a lot of sugar and must be counted as carbs. What are tips for following this plan? Reading food labels Start by checking the serving size on the "Nutrition Facts" label of packaged foods and drinks. The amount of calories, carbs, fats, and other nutrients listed on the label is based on one serving of the item. Many items contain more than one serving per package. Check the total grams (g) of carbs in one serving. You can calculate the number of servings of carbs in one serving by dividing the total carbs by 15. For example, if a food has 30 g of total carbs per serving, it would be equal to 2 servings of carbs. Check the number of grams (g) of saturated fats and trans fats in one serving. Choose foods that have a low amount or none of these fats. Check the number of milligrams (mg) of  salt (sodium) in  one serving. Most people should limit total sodium intake to less than 2,300 mg per day. Always check the nutrition information of foods labeled as "low-fat" or "nonfat." These foods may be higher in added sugar or refined carbs and should be avoided. Talk to your dietitian to identify your daily goals for nutrients listed on the label. Shopping Avoid buying canned, pre-made, or processed foods. These foods tend to be high in fat, sodium, and added sugar. Shop around the outside edge of the grocery store. This is where you will most often find fresh fruits and vegetables, bulk grains, fresh meats, and fresh dairy. Cooking Use low-heat cooking methods, such as baking, instead of high-heat cooking methods like deep frying. Cook using healthy oils, such as olive, canola, or sunflower oil. Avoid cooking with butter, cream, or high-fat meats. Meal planning Eat meals and snacks regularly, preferably at the same times every day. Avoid going long periods of time without eating. Eat foods that are high in fiber, such as fresh fruits, vegetables, beans, and whole grains. Talk with your dietitian about how many servings of carbs you can eat at each meal. Eat 4-6 oz (112-168 g) of lean protein each day, such as lean meat, chicken, fish, eggs, or tofu. One ounce (oz) of lean protein is equal to: 1 oz (28 g) of meat, chicken, or fish. 1 egg.  cup (62 g) of tofu. Eat some foods each day that contain healthy fats, such as avocado, nuts, seeds, and fish. What foods should I eat? Fruits Berries. Apples. Oranges. Peaches. Apricots. Plums. Grapes. Mango. Papaya. Pomegranate. Kiwi. Cherries. Vegetables Lettuce. Spinach. Leafy greens, including kale, chard, collard greens, and mustard greens. Beets. Cauliflower. Cabbage. Broccoli. Carrots. Green beans. Tomatoes. Peppers. Onions. Cucumbers. Brussels sprouts. Grains Whole grains, such as whole-wheat or whole-grain bread, crackers, tortillas,  cereal, and pasta. Unsweetened oatmeal. Quinoa. Brown or wild rice. Meats and other proteins Seafood. Poultry without skin. Lean cuts of poultry and beef. Tofu. Nuts. Seeds. Dairy Low-fat or fat-free dairy products such as milk, yogurt, and cheese. The items listed above may not be a complete list of foods and beverages you can eat. Contact a dietitian for more information. What foods should I avoid? Fruits Fruits canned with syrup. Vegetables Canned vegetables. Frozen vegetables with butter or cream sauce. Grains Refined white flour and flour products such as bread, pasta, snack foods, and cereals. Avoid all processed foods. Meats and other proteins Fatty cuts of meat. Poultry with skin. Breaded or fried meats. Processed meat. Avoid saturated fats. Dairy Full-fat yogurt, cheese, or milk. Beverages Sweetened drinks, such as soda or iced tea. The items listed above may not be a complete list of foods and beverages you should avoid. Contact a dietitian for more information. Questions to ask a health care provider Do I need to meet with a diabetes educator? Do I need to meet with a dietitian? What number can I call if I have questions? When are the best times to check my blood glucose? Where to find more information: American Diabetes Association: diabetes.org Academy of Nutrition and Dietetics: www.eatright.[employer] of Diabetes and Digestive and Kidney Diseases: [employer] Association of Diabetes Care and Education Specialists: www.diabeteseducator.org Summary It is important to have healthy eating habits because your blood sugar (glucose) levels are greatly affected by what you eat and drink. A healthy meal plan will help you control your blood glucose and maintain a healthy lifestyle. Your health care provider may recommend that you work with a dietitian to  make a meal plan that is best for you. Keep in mind that carbohydrates (carbs) and alcohol have immediate  effects on your blood glucose levels. It is important to count carbs and to use alcohol carefully. This information is not intended to replace advice given to you by your health care provider. Make sure you discuss any questions you have with your health care provider. Document Revised: 06/08/2019 Document Reviewed: 06/08/2019 Elsevier Patient Education  2021 [employer].

## 2021-03-23 NOTE — Progress Notes (Signed)
[place], [place]  [address] Phone:  [phone]   Fax:  [phone]   Established Patient Office Visit  Subjective:  Patient ID: Tina Brown, female    DOB: 1986-03-25  Age: 35 y.o. MRN: [id number]  CC:  Chief Complaint  Patient presents with   Diabetes    Pt is here today for her follow up visit. Pt states that her glucose levels have been ranging in the 200's. Pt states that she has not been taking her insulin but is taking the metformin.    HPI Raneem Ferrero presents for follow up. She  has a past medical history of Diabetes mellitus without complication ([facility]), Fibroid, Foot callus (08/2019), Hypertension, and In vitro fertilization.   She has DM currently on Mefformin 1000 mg twice daily. Her CBG has been elevated over 200 this week. She reports range of 95 to 400. She is not eating a diet consistent with her diagnosis. She is breast feeding.  She was on insulin during her pregnancy and maintaining her blood sugar. Denies headache, dizziness, visual changes, shortness of breath, dyspnea on exertion, chest pain, nausea, vomiting or any edema.   Past Medical History:  Diagnosis Date   Diabetes mellitus without complication ([place])    Fibroid    Foot callus 08/2019   Hypertension    In vitro fertilization     Past Surgical History:  Procedure Laterality Date   CESAREAN SECTION N/A 10/18/2020   Procedure: CESAREAN SECTION;  Surgeon: Gwynne Edinger, MD;  Location: [facility];  Service: Obstetrics;  Laterality: N/A;   IVF      Family History  Problem Relation Age of Onset   Diabetes Mother    Hypertension Mother    Diabetes Father    Hypertension Father    Diabetes Maternal Grandmother    Hypertension Maternal Grandmother    Diabetes Maternal Grandfather    Hypertension Maternal Grandfather    Diabetes Paternal Grandmother    Hypertension Paternal Grandmother    Diabetes Paternal Grandfather    Hypertension Paternal  Grandfather     Social History   Socioeconomic History   Marital status: Married    Spouse name: Not on file   Number of children: Not on file   Years of education: Not on file   Highest education level: Not on file  Occupational History   Not on file  Tobacco Use   Smoking status: Never   Smokeless tobacco: Never  Vaping Use   Vaping Use: Never used  Substance and Sexual Activity   Alcohol use: No   Drug use: No   Sexual activity: Yes    Partners: Male    Birth control/protection: None  Other Topics Concern   Not on file  Social History Narrative   Not on file   Social Determinants of Health   Financial Resource Strain: Not on file  Food Insecurity: No Food Insecurity   Worried About Running Out of Food in the Last Year: Never true   [place] in the Last Year: Never true  Transportation Needs: No Transportation Needs   Lack of Transportation (Medical): No   Lack of Transportation (Non-Medical): No  Physical Activity: Not on file  Stress: Not on file  Social Connections: Not on file  Intimate Partner Violence: Unknown   Fear of Current or Ex-Partner: No   Emotionally Abused: No   Physically Abused: No   Sexually Abused: Not on file  Outpatient Medications Prior to Visit  Medication Sig Dispense Refill   Blood Glucose Monitoring Suppl (ACCU-CHEK GUIDE ME) w/Device KIT PLEASE CHECK BLOOD GLUCOSE 4 TIMES DAILY.     metFORMIN (GLUCOPHAGE) 1000 MG tablet Take 1 tablet (1,000 mg total) by mouth 2 (two) times daily. With meals 180 tablet 3   Prenatal Vit-Fe Fumarate-FA (PREPLUS) 27-1 MG TABS Take 1 tablet by mouth daily. 30 tablet 6   acetaminophen (TYLENOL) 325 MG tablet Take 2 tablets (650 mg total) by mouth every 4 (four) hours as needed for mild pain (temperature > 101.5.). (Patient not taking: Reported on 03/23/2021) 60 tablet 0   ibuprofen (ADVIL) 600 MG tablet Take 1 tablet (600 mg total) by mouth every 6 (six) hours. (Patient not taking: Reported on  03/23/2021) 30 tablet 0   No facility-administered medications prior to visit.    No Known Allergies  ROS Review of Systems    Objective:    Physical Exam HENT:     Head: Normocephalic and atraumatic.     Nose: Nose normal.     Mouth/Throat:     Mouth: Mucous membranes are moist.  Cardiovascular:     Rate and Rhythm: Normal rate and regular rhythm.     Pulses: Normal pulses.     Heart sounds: Normal heart sounds.  Pulmonary:     Effort: Pulmonary effort is normal.     Breath sounds: Normal breath sounds.  Abdominal:     General: Abdomen is flat.     Palpations: Abdomen is soft.  Musculoskeletal:     Cervical back: Normal range of motion.     Right lower leg: No edema.     Left lower leg: No edema.  Skin:    Capillary Refill: Capillary refill takes less than 2 seconds.  Neurological:     General: No focal deficit present.     Mental Status: She is alert and oriented to person, place, and time.  Psychiatric:        Mood and Affect: Mood normal.        Behavior: Behavior normal.        Thought Content: Thought content normal.        Judgment: Judgment normal.    BP 125/83   Pulse 87   Temp 97.9 F (36.6 C)   Ht 5' 7"  (1.702 m)   Wt 153 lb (69.4 kg)   LMP  (LMP Unknown)   SpO2 100%   Breastfeeding Yes   BMI 23.96 kg/m  Wt Readings from Last 3 Encounters:  03/23/21 153 lb (69.4 kg)  12/04/20 154 lb (69.9 kg)  11/08/20 150 lb 9.6 oz (68.3 kg)     Health Maintenance Due  Topic Date Due   COVID-19 Vaccine (1) Never done   OPHTHALMOLOGY EXAM  Never done   INFLUENZA VACCINE  02/12/2021    There are no preventive care reminders to display for this patient.  Lab Results  Component Value Date   TSH 0.68 05/31/2016   Lab Results  Component Value Date   WBC 11.4 (H) 10/19/2020   HGB 9.4 (L) 10/19/2020   HCT 28.1 (L) 10/19/2020   MCV 88.1 10/19/2020   PLT 150 10/19/2020   Lab Results  Component Value Date   NA 138 12/21/2020   K 4.1 12/21/2020    CO2 23 10/18/2020   GLUCOSE 154 (H) 12/21/2020   BUN 13 12/21/2020   CREATININE 0.61 12/21/2020   BILITOT <0.2 12/21/2020   ALKPHOS 87 12/21/2020  AST 18 12/21/2020   ALT 37 10/18/2020   PROT 7.5 12/21/2020   ALBUMIN 4.5 12/21/2020   CALCIUM 9.5 12/21/2020   ANIONGAP 4 (L) 10/18/2020   EGFR 118 12/21/2020   No results found for: CHOL No results found for: HDL No results found for: LDLCALC No results found for: TRIG No results found for: CHOLHDL Lab Results  Component Value Date   HGBA1C 9.1 (A) 03/23/2021   HGBA1C 9.1 03/23/2021   HGBA1C 9.1 (A) 03/23/2021   HGBA1C 9.1 (A) 03/23/2021      Assessment & Plan:   Problem List Items Addressed This Visit   None Visit Diagnoses     Diabetes mellitus without complication ([place])    -  Primary   Relevant Medications   insulin glargine (LANTUS) 100 UNIT/ML Solostar Pen   Other Relevant Orders   POCT URINALYSIS DIP (CLINITEK) (Completed)   HgB A1c (Completed)   Glucose (CBG) (Completed)   Type 2 diabetes mellitus with hyperglycemia, with long-term current use of insulin ([facility])     Encourage compliance with current treatment regimen  Dose adjustment Lantus 20 units twice daily Encourage regular CBG monitoring Encourage contacting office if excessive hyperglycemia and or hypoglycemia Lifestyle modification with healthy diet (fewer calories, more high fiber foods, whole grains and non-starchy vegetables, lower fat meat and fish, low-fat diary include healthy oils) regular exercise (physical activity) and weight loss Nutritional consult recommended     Relevant Medications   insulin glargine (LANTUS) 100 UNIT/ML Solostar Pen       No orders of the defined types were placed in this encounter.   Follow-up: No follow-ups on file.    Vevelyn Francois, NP

## 2021-03-24 IMAGING — US US MFM FETAL BPP W/O NON-STRESS
1 series · 15 of 28 positions shown · non-contrast
Comparison: none

[Series 1: us mfm fetal bpp w/o non-stress · 31 acquisitions, 15 frames shown]
[im 1/31]
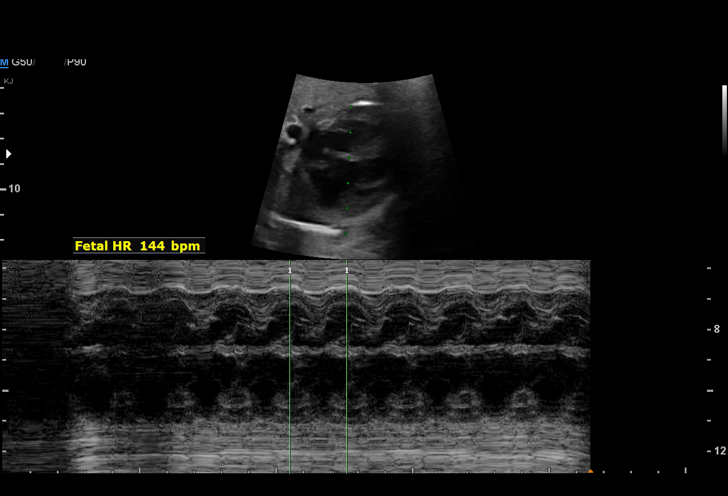
[im 3/31]
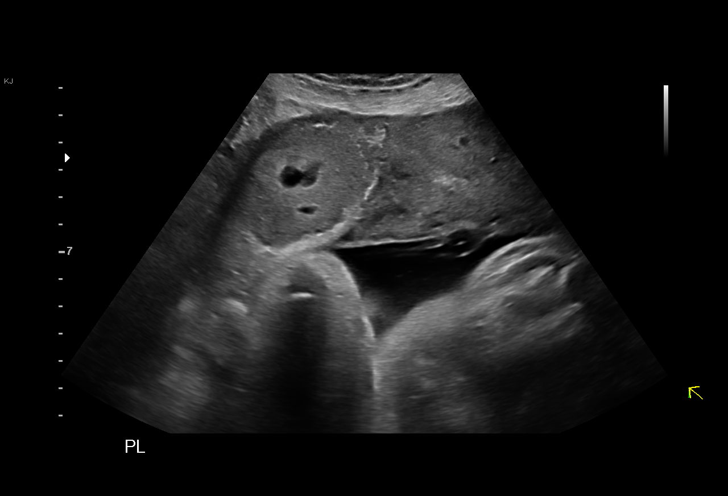
[im 5/31]
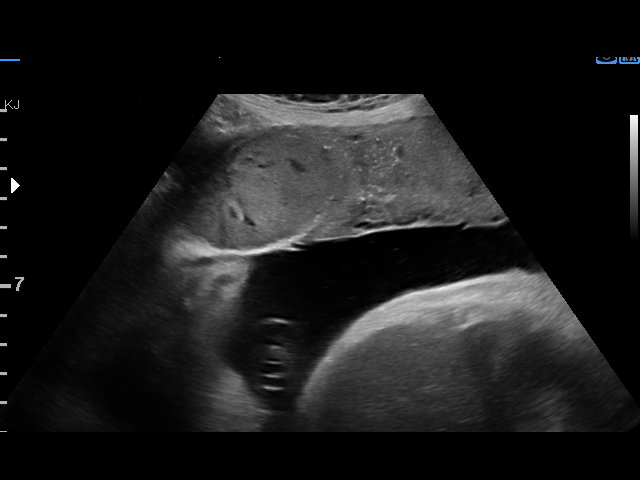
[im 7/31]
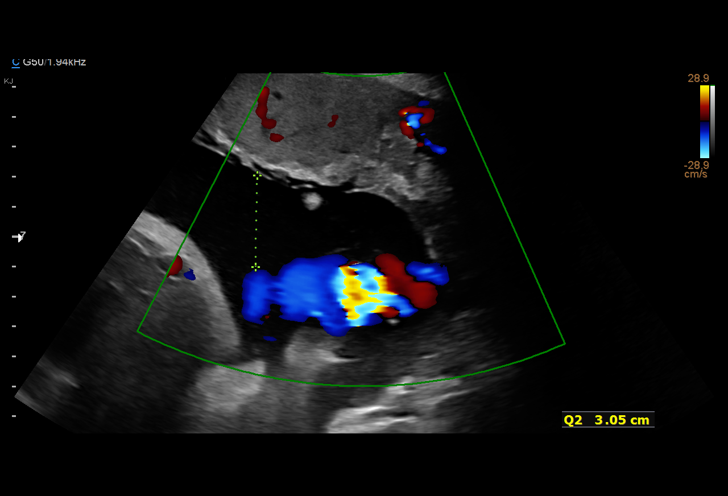
[im 9/31]
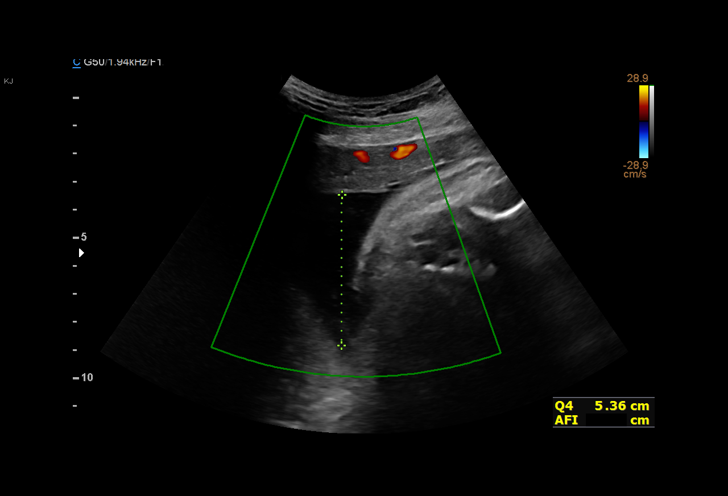
[im 12/31]
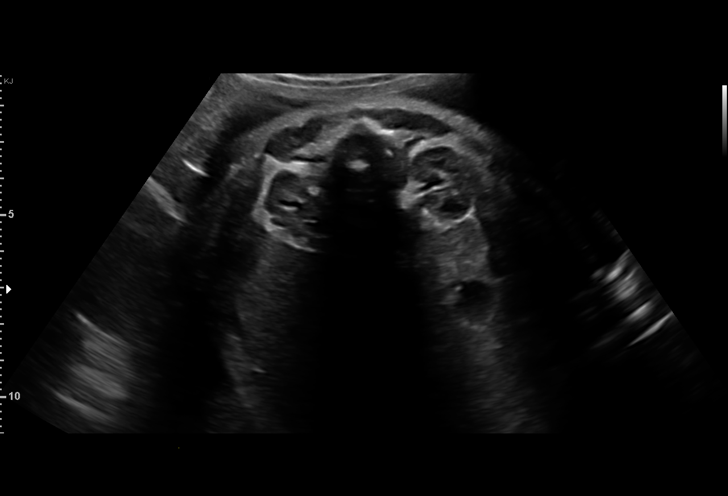
[im 14/31]
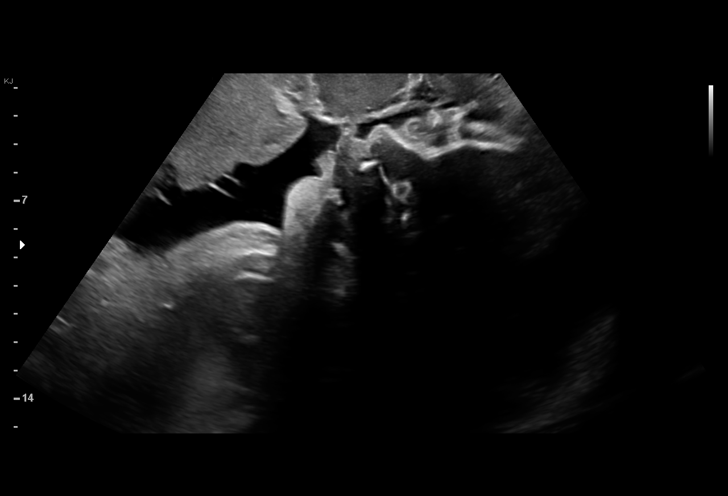
[im 16/31]
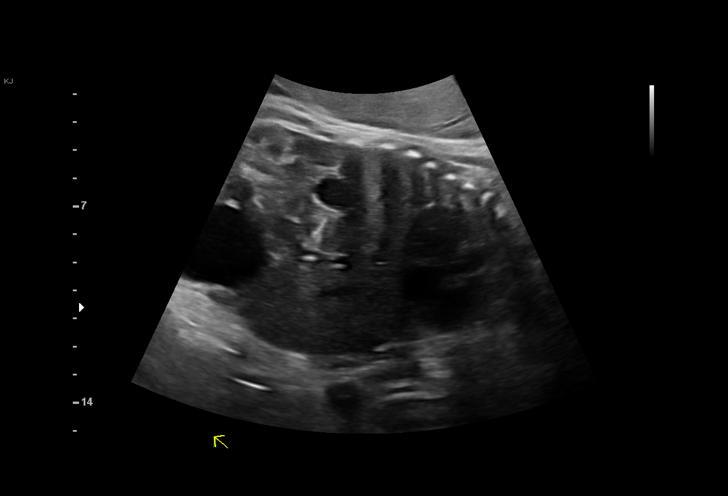
[im 17/31]
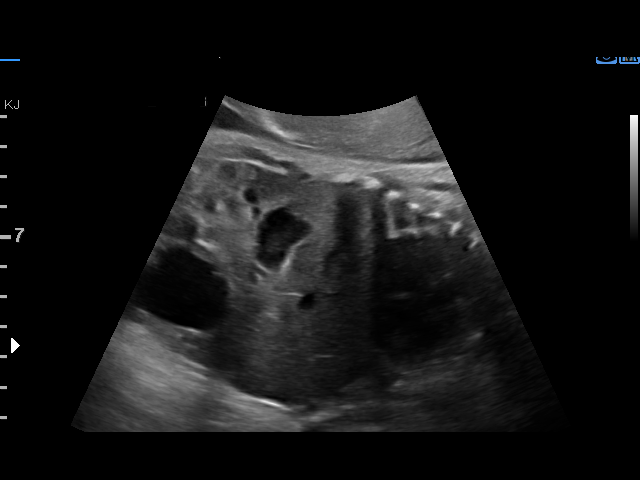
[im 19/31]
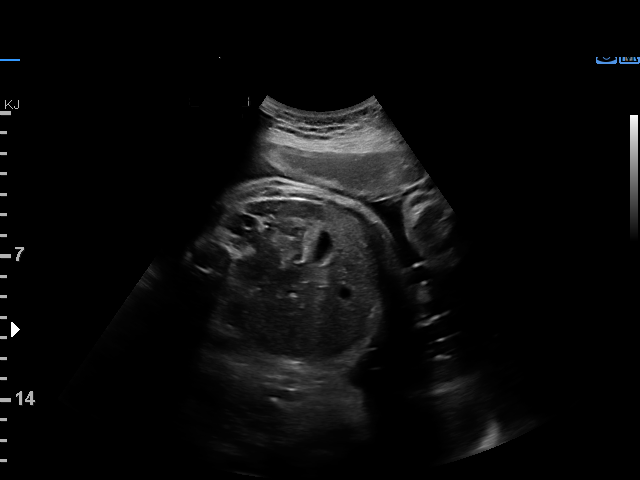
[im 22/31]
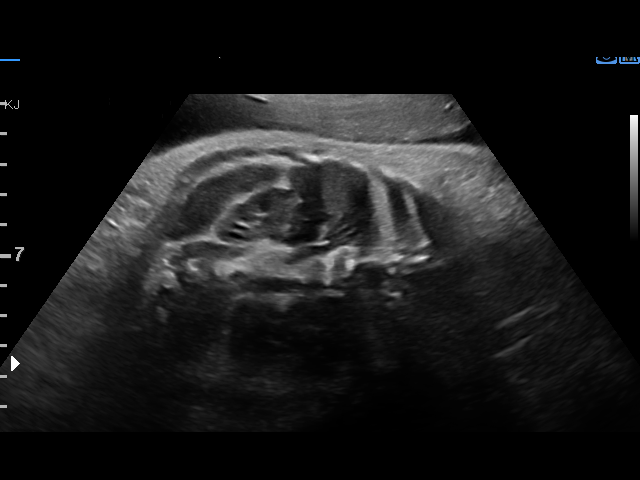
[im 24/31]
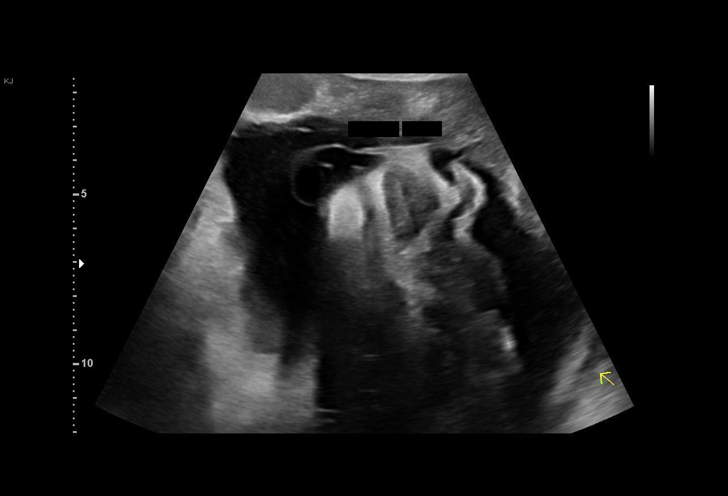
[im 26/31]
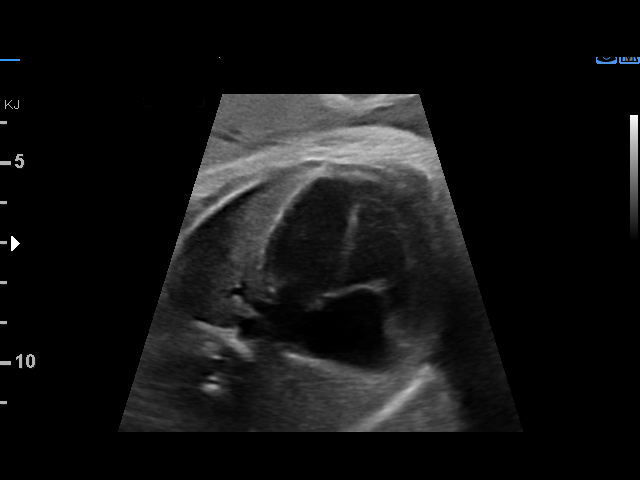
[im 28/31]
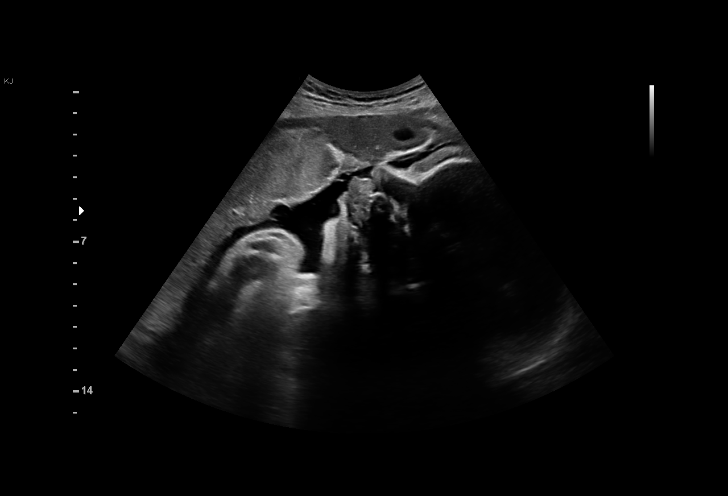
[im 31/31]
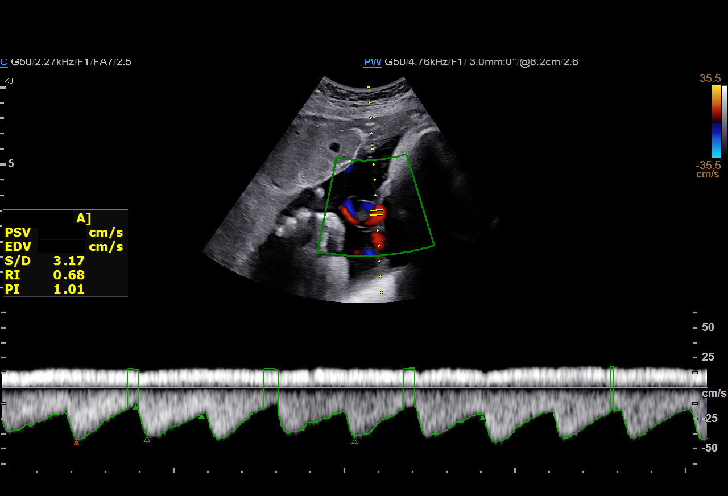

[15 of 28 positions shown; findings below may reference images not displayed]

Indications

 Abnormal biochemical screen (QUAD - AFP
 MoM 2.75)
 Pre-existing diabetes, type 2, in pregnancy,
 third trimester (metformin and Insulin)
 Pregnancy resulting from assisted
 reproductive technology
 Advanced maternal age primigravida 35+,
 third trimester(35 yrs)
 Uterine fibroids
 Large for gestational age fetus affecting
 management of mother
 Fetal ECHO Normal
 35 weeks gestation of pregnancy
Fetal Evaluation

 Num Of Fetuses:         1
 Fetal Heart Rate(bpm):  144
 Cardiac Activity:       Observed
 Presentation:           Cephalic
 Placenta:               Anterior
 P. Cord Insertion:      Previously Visualized

 Amniotic Fluid
 AFI FV:      Within normal limits

 AFI Sum(cm)     %Tile       Largest Pocket(cm)
 12.62           40
 RUQ(cm)       RLQ(cm)       LUQ(cm)        LLQ(cm)
 4.21          5.36          3.05           0
Biophysical Evaluation

 Amniotic F.V:   Within normal limits       F. Tone:        Observed
 F. Movement:    Observed                   Score:          [DATE]
 F. Breathing:   Observed
Biometry

 LV:        7.5  mm
OB History

 Gravidity:    1
Gestational Age

 LMP:           35w 2d        Date:  01/31/20                 EDD:   11/06/20
 Best:          35w 2d     Det. By:  LMP  (01/31/20)          EDD:   11/06/20
Anatomy

 Ventricles:            Appears normal         Stomach:                Appears normal, left
                                                                       sided
 Heart:                 Appears normal         Kidneys:                Appear normal
                        (4CH, axis, and
                        situs)
 Diaphragm:             Appears normal         Bladder:                Appears normal

 Other:  All other anatomy previously seen as normal
[place]

 Site                     L(cm)      W(cm)      D(cm)       Location
 Anterior left lateral    4.4        7

 Blood Flow                  RI       PI       Comments

Comments

 This patient was seen for a biophysical profile due to
 advanced maternal age, an elevated MSAFP of 2.75 MoM,
 and pregestational diabetes that is currently treated with
 insulin and Metformin.  She denies any problems since her
 last exam.
 A biophysical profile performed today was [DATE].
 There was normal amniotic fluid noted on today's ultrasound
 exam.
 She will return in 1 week for another biophysical profile.

## 2021-04-20 ENCOUNTER — Ambulatory Visit (INDEPENDENT_AMBULATORY_CARE_PROVIDER_SITE_OTHER): Payer: Medicaid Other | Admitting: Nurse Practitioner

## 2021-04-20 ENCOUNTER — Other Ambulatory Visit: Payer: Self-pay

## 2021-04-20 ENCOUNTER — Encounter: Payer: Self-pay | Admitting: Nurse Practitioner

## 2021-04-20 VITALS — BP 128/65 | HR 67 | Temp 97.9°F | Ht 67.0 in | Wt 154.0 lb

## 2021-04-20 DIAGNOSIS — E1165 Type 2 diabetes mellitus with hyperglycemia: Secondary | ICD-10-CM

## 2021-04-20 DIAGNOSIS — Z794 Long term (current) use of insulin: Secondary | ICD-10-CM | POA: Diagnosis not present

## 2021-04-20 NOTE — Progress Notes (Signed)
[place], [place]  [address] Phone:  [phone]   Fax:  [phone]   Established Patient Office Visit  Subjective:  Patient ID: Tina Brown, female    DOB: 09-Oct-1985  Age: 35 y.o. MRN: [id number]  CC:  Chief Complaint  Patient presents with   Follow-up    4 week follow up;     HPI Raniah Kimple presents for follow up. She  has a past medical history of Diabetes mellitus without complication ([place]), Fibroid, Foot callus (08/2019), Hypertension, and In vitro fertilization.   She is in today follow up for her DM. She has seen 92 to 210. She is eating about the same. She denies any side effects with restarting the lantus. Denies headache, dizziness, visual changes, shortness of breath, dyspnea on exertion, chest pain, nausea, vomiting or any edema.  She continues to breastfeed.   Past Medical History:  Diagnosis Date   Diabetes mellitus without complication ([place])    Fibroid    Foot callus 08/2019   Hypertension    In vitro fertilization     Past Surgical History:  Procedure Laterality Date   CESAREAN SECTION N/A 10/18/2020   Procedure: CESAREAN SECTION;  Surgeon: Gwynne Edinger, MD;  Location: [facility];  Service: Obstetrics;  Laterality: N/A;   IVF      Family History  Problem Relation Age of Onset   Diabetes Mother    Hypertension Mother    Diabetes Father    Hypertension Father    Diabetes Maternal Grandmother    Hypertension Maternal Grandmother    Diabetes Maternal Grandfather    Hypertension Maternal Grandfather    Diabetes Paternal Grandmother    Hypertension Paternal Grandmother    Diabetes Paternal Grandfather    Hypertension Paternal Grandfather     Social History   Socioeconomic History   Marital status: Married    Spouse name: Not on file   Number of children: Not on file   Years of education: Not on file   Highest education level: Not on file  Occupational History   Not on file  Tobacco Use    Smoking status: Never   Smokeless tobacco: Never  Vaping Use   Vaping Use: Never used  Substance and Sexual Activity   Alcohol use: No   Drug use: No   Sexual activity: Yes    Partners: Male    Birth control/protection: None  Other Topics Concern   Not on file  Social History Narrative   Not on file   Social Determinants of Health   Financial Resource Strain: Not on file  Food Insecurity: No Food Insecurity   Worried About Running Out of Food in the Last Year: Never true   [place] in the Last Year: Never true  Transportation Needs: No Transportation Needs   Lack of Transportation (Medical): No   Lack of Transportation (Non-Medical): No  Physical Activity: Not on file  Stress: Not on file  Social Connections: Not on file  Intimate Partner Violence: Unknown   Fear of Current or Ex-Partner: No   Emotionally Abused: No   Physically Abused: No   Sexually Abused: Not on file    Outpatient Medications Prior to Visit  Medication Sig Dispense Refill   Blood Glucose Monitoring Suppl (ACCU-CHEK GUIDE ME) w/Device KIT PLEASE CHECK BLOOD GLUCOSE 4 TIMES DAILY.     insulin glargine (LANTUS) 100 UNIT/ML Solostar Pen Inject 20 Units into the skin 2 (  two) times daily. 12 mL 11   Insulin Pen Needle 31G X 5 MM MISC Use as directed 2 times daily. 100 each 11   metFORMIN (GLUCOPHAGE) 1000 MG tablet Take 1 tablet (1,000 mg total) by mouth 2 (two) times daily. With meals 180 tablet 3   Prenatal Vit-Fe Fumarate-FA (PREPLUS) 27-1 MG TABS Take 1 tablet by mouth daily. 30 tablet 6   acetaminophen (TYLENOL) 325 MG tablet Take 2 tablets (650 mg total) by mouth every 4 (four) hours as needed for mild pain (temperature > 101.5.). (Patient not taking: Reported on 03/23/2021) 60 tablet 0   ibuprofen (ADVIL) 600 MG tablet Take 1 tablet (600 mg total) by mouth every 6 (six) hours. (Patient not taking: Reported on 03/23/2021) 30 tablet 0   No facility-administered medications prior to visit.    No  Known Allergies  ROS Review of Systems    Objective:    Physical Exam HENT:     Head: Normocephalic and atraumatic.     Nose: Nose normal.     Mouth/Throat:     Mouth: Mucous membranes are moist.  Cardiovascular:     Rate and Rhythm: Normal rate and regular rhythm.     Pulses: Normal pulses.     Heart sounds: Normal heart sounds.  Pulmonary:     Effort: Pulmonary effort is normal.     Breath sounds: Normal breath sounds.  Abdominal:     General: Abdomen is flat.     Palpations: Abdomen is soft.  Musculoskeletal:     Cervical back: Normal range of motion.     Right lower leg: No edema.     Left lower leg: No edema.  Skin:    Capillary Refill: Capillary refill takes less than 2 seconds.  Neurological:     General: No focal deficit present.     Mental Status: She is alert and oriented to person, place, and time.  Psychiatric:        Mood and Affect: Mood normal.        Behavior: Behavior normal.        Thought Content: Thought content normal.        Judgment: Judgment normal.    BP 128/65   Pulse 67   Temp 97.9 F (36.6 C)   Ht 5' 7"  (1.702 m)   Wt 154 lb 0.6 oz (69.9 kg)   LMP  (LMP Unknown)   SpO2 100%   Breastfeeding Yes   BMI 24.13 kg/m  Wt Readings from Last 3 Encounters:  04/20/21 154 lb 0.6 oz (69.9 kg)  03/23/21 153 lb (69.4 kg)  12/04/20 154 lb (69.9 kg)     Health Maintenance Due  Topic Date Due   COVID-19 Vaccine (1) Never done   OPHTHALMOLOGY EXAM  Never done    There are no preventive care reminders to display for this patient.  Lab Results  Component Value Date   TSH 0.68 05/31/2016   Lab Results  Component Value Date   WBC 11.4 (H) 10/19/2020   HGB 9.4 (L) 10/19/2020   HCT 28.1 (L) 10/19/2020   MCV 88.1 10/19/2020   PLT 150 10/19/2020   Lab Results  Component Value Date   NA 138 12/21/2020   K 4.1 12/21/2020   CO2 23 10/18/2020   GLUCOSE 154 (H) 12/21/2020   BUN 13 12/21/2020   CREATININE 0.61 12/21/2020   BILITOT <0.2  12/21/2020   ALKPHOS 87 12/21/2020   AST 18 12/21/2020   ALT 37 10/18/2020  PROT 7.5 12/21/2020   ALBUMIN 4.5 12/21/2020   CALCIUM 9.5 12/21/2020   ANIONGAP 4 (L) 10/18/2020   EGFR 118 12/21/2020   No results found for: CHOL No results found for: HDL No results found for: LDLCALC No results found for: TRIG No results found for: CHOLHDL Lab Results  Component Value Date   HGBA1C 9.1 (A) 03/23/2021   HGBA1C 9.1 03/23/2021   HGBA1C 9.1 (A) 03/23/2021   HGBA1C 9.1 (A) 03/23/2021      Assessment & Plan:   Problem List Items Addressed This Visit   None Visit Diagnoses     Type 2 diabetes mellitus with hyperglycemia, with long-term current use of insulin ([place])    -  Primary Encourage compliance with current treatment regimen  Dose adjustment decreased Lantus 15 units BID Encourage regular CBG monitoring Encourage contacting office if excessive hyperglycemia and or hypoglycemia Lifestyle modification with healthy diet (fewer calories, more high fiber foods, whole grains and non-starchy vegetables, lower fat meat and fish, low-fat diary include healthy oils) regular exercise (physical activity)  Opthalmology exam discussed  Nutritional consult recommended Regular dental visits encouraged Home BP monitoring also encouraged goal <130/80        No orders of the defined types were placed in this encounter.   Follow-up: Return in about 2 months (around 06/20/2021) for follow up DM [id number].    Vevelyn Francois, NP

## 2021-04-20 NOTE — Patient Instructions (Addendum)
??????? ?????? ?? ??? ?????? Diabetes Mellitus Basics ??? ??????? ?? ??? ?????? ???? ???? ????? (????). ???? ??????? ??? ?? ?????? ?????? ?????? (????????) ?????? ?? ??? ?????? ???? ????? ??? ?????. ??? ???? ??? ?????? ???? ????? ????? ??? ??? ?? ????????? ????: ??? ????? ????????? ?? ???? ?? ????? ???? ?????????. ??? ??????? ????? ?????? ?????? ????????? ???? ?????. ???? ????????? ???????? ??????? ??? ??????? ?? ?????. ?????? ?? ??? ?? ????? ??????. ??? ??? ??? ?????? ???????? ??? ???????? ?? ????? ?????? ??? ???????. ?????? ??? ??? ????? ???????? ?? ???? (??? ??? ????). ???? ??? ?????? ???????? ???? ?? ????? ??? ??????? ????????? ?? ???? ??? ?????? ?????? ?????? ??? ????? ???????? ?? ????. ???? ??? ?? ???? ??????? ?????? ??????? ?????????? ??????? ????? ??? ???? ??????????. ??? ????? ??? ??? ???? ????????? ???? ???? ???? ??? ????? ??? ?????? ???? ???????. ?????? ??? ???? ???? ???????? ?? ???? ???????? ???? ????? ???????? ??? ??????? ???? ??????? ?????? ??????? ??. ???????? ????? ?????? ??? ?????? ?????? ???? ???????? ?? ???? ?? ???????. ???? ???? ??? ?? ???? ???? ???????? ???? ?? ?????? ???????: ??? ??????? (??? ?????): 80-130 ???/??????? (4.4-7.2 ??? ???/???). ??? ??????? (??? ?????): ??? ?? 180 ???/??????? (10 ??? ???/???). ????? ?????????? A1C? (HbA1c): ??? ?? 7%. ????? ????? ??????? ?????? ????? ?????? ???? ?????? ?????. ????? ????? ?????? ????????. ???? ??? ???. ???? ????????? ??????? ?? ???????: ????? ???? ?????? ????? ????? ?????? ???????? ?? ??? ??????? ???? ??????? ?????? ??????? ??. ???? ????? ?????? ???? ???????? ???: ??? ??????: _____________________ ?????? (??????): _________________ ????? (??????/?????): _______________ ???????: ___________________________________ ??? ??????: _____________________ ?????? (??????): _________________ ????? (??????/?????): _______________ ???????: ___________________________________ ??? ??????: _____________________ ?????? (??????):  _________________ ????? (??????/?????): _______________ ???????: ___________________________________ ????????? ??? ??? ?????? ?????????? ????? ????? ????????? ???? ???????? ???: ??? ?????????: ________________ ?????? (??????): _________________ ????? (??????/?????): _______________???????: ___________________________________ ??? ?????????: ________________ ?????? (??????): _________________ ????? (??????/?????): _______________ ???????: ___________________________________ ??? ?????????: ________________ ?????? (??????): _________________ ????? (??????/?????): _______________ ???????: ___________________________________ ??? ?????????: ________________ ?????? (??????): _________________ ????? (??????/?????): _______________ ???????: ___________________________________ ??? ?????????: ________________ ?????? (??????): _________________ ????? (??????/?????): _______________ ???????: ___________________________________ ??????? ??? ???? ???????? ?? ???? ???? ??? ???? ???? ???????? ?? ???? ???????? ???? ???? ???????? ??? ??????? ???? ??????? ?????? ??????? ??. ???? ????? ???? ??? ??? ???? ???????? ???????? ???: ?????: _______________ ???????: ___________________________________ ?????: _______________ ???????: ___________________________________ ?????: _______________ ???????: ___________________________________ ?????: _______________ ???????: ___________________________________ ?????: _______________ ???????: ___________________________________ ?????: _______________ ???????: ___________________________________  ?????? ???????? ?? ???? ??????? ??????? ???????? ?? ???? (??? ??? ????) ???? ???? ?????? ???? ???? ??? 70 ???/??????? (3.9 ??? ???/???) ?? ???. ????? ???? ??????? ?? ???: ?????? ??? ???: ?????. ????? ????????. ????????? ?? ????? ????? ???????. ??????. ??????. ?? ????? ??: ????? ??? ?????. ??????. ???? ?? ????? ??? ???????. ????? ??? ???? ?? ??????? ?? ??????. ???????? ?? ??????  ??: ?????? (????? ??????). ?????. ???? ?????? ???? ???????? ?? ???? ????? ?????? ???????? ?? ????? ???? ?????? ?? ???? ?? ????? ????? ??? ????? ?????. ???? ??? ?????? ??????? ????? ?????? ?????? ????? ????? 15:15: ????? 15 ?????? ?? ???????????? ????? ???????? ??? ??????? ???? ??????? ?????? ??????? ??. ??? ???????????? ????? ???????: ????? ????????: ????? 3-4 ?????. ??? ?????? ??????: ????? 3-5 ???. ???? ???????: ???? 4 ?????? (120 ??). ?????? ??????? ??????? (????? ??????? ?????? ????????)? 4-6 ?????? (120-180 ??). ??? ????? ?? ?????: ????? ????? ????? (15 ??). ??? ???? ???? ???????? ?? ????? ??? 15 ????? ?? ????? ????????????. ????? 15 ?????? ???? ?? ???????????? ??? ???? ???? ???????? ?? ???? ?? ???? ?? ????? ??  70 ???/??????? (3.9 ??? ???/???). ??? ?? ????? ???? ???????? ?? 70 ???/??????? (3.9 ??????/???) ??? 3 ???????? ????? ???????? ?????. ??? ???? ???? ???????? ??? ?????? ????????? ????? ???? ????? ?? ???? ????? ???? ???? ?????. ???? ???????? ?????? ?? ???? ???????? ?? ???? ????? ??? ???? ???????? ???? ??? 54 ???/??????? (3 ??????/???) ?? ???? ???? ???? ??? ????? ?? ?????? ???? ?? ???? ???????? ?? ???? (??? ???? ?? ????? ?? ????). ??? ???? ?????. ??? ?? ????? ?? ??? ???? ??????? ????? ?? ??. ???? ??? ???????? ?????? ??? ?????. ???? ?????? ??????? ??????? (911 ?? ???????? ??????? ?????????). ?? ??? ??????? ????? ??? ????????. ???? ??? ??? ??????? ?????? ??? ???? ??????? ?????? ?? ??? ??? ????????? ?????? ?? ????? ???? ??????? ?? ??????? ???? ?????? ????? ??????? ????? ?? ??????? ?? ????? ?????? ???? ????? ?????? ??? ??? ?? ????????? ?? ??? ????? ??? ???? ???? ???????? ?? ????? ?? ??? ?????? ???? ?????? ??????? ?? ???? ?? ??? ?????? ?? ???? ????? ???????? ??????? ??? ??? ?????? ?????? ??? ?????? ??? ????? ??????? ????? ?????? ??? ?????? ?? ?????????: ??????? ????????? ?????? (American Diabetes Association):? [email or web address] ????? ?????? ????? ???? ?????? ???????? (Association of  Diabetes Care and Education Specialists): [email or web address] ???? ????? ??????? ?????? ?? ??????? ???????: ???? ????? ???????? ???? ??? 240 ???/??????? (13.3 ??? ???/ ???) ?? ???? ???? ????? ????????. ??????? ?????? ?? ?????? ???????? ??? ????? ?? ????? ??? ????. ??????? ????? ???????? ???????? ?????????? ????? ?? 6 ?????: ??? ????? ??? ????? ?? ?????. ?????? ??????. ??????? ???????. ??????? ????????. ???? ???????? ????? ?? ??????? ???????: ?????? ???? ???????? ?? ??? ??? ??? ?? 54 ????/ ??????? (3 ??????/???). ?????? ????? ?????. ???? ????? ?? ??????? ?????. ??????? ?????? ?? ??????. ???? ???? ??? ??????? ??? ????? ????? ????? ???? ???? ?????. ??? ?? ????? ?? ??? ???? ??????? ????? ?? ??. ???? ??? ???????? ?????? ??? ?????. ???? ?????? ??????? ??????? (911 ?? ???????? ??????? ?????????). ?? ??? ??????? ????? ??? ????????. ???? ??? ?????? ???? ???? ???? ????? ?? ?????? ?????? ??? ????? ?????? (????????) ?????? ?? ??? ??????. ?????? ????????? ?????? ????? ?????? ??? ?????????. ???? ??? ???? ???? ???????? ?? ????? ?????? ??? ?????????. ????? ????? ?????? ????????. ???? ??? ???. ??? ????? ?? ??? ????????? ?? ???? ?????? ????????? ???? ?????? ???? ??????? ??????. ???? ?? ?????? ??? ????? ???? ?? ???? ?? ???? ??????? ??????.? Document Revised: 12/29/2019 Document Reviewed: 12/29/2019 Elsevier Patient Education  2022 [employer].  ??? ?????? ????????? ???????? Diabetes Mellitus and Nutrition, Adult ????? ????? ?? ?????? ?? ??? ??????? ???? ?? ??????? ???? ?? ???? ????? ????? ???? ???? ??? ??????? ??? ???? (????????) ????? ???? ???? ??? ????? ??????. ?? ????? ??????? ?????? ?????? ?????? ?? ???????? ????? ??????? ?? ???? ?????? ???: ?????? ?? ??????? ?????? ????. ????? ????? ??????? ?????? ?????. ????? ??? ????. ?????? ??? ????? ????? ??????? ????. ?? ???? ???? ?? ???? ??? ??? ??????? ?????? ??? ????? ?? ??? ????? ?????? ?? ????? ???? ??? ???????? ?????? ??????? ???? ???????. ??? ??  ???? ????? ??????? ?????? ?? ?????? ?? ?????? ???? ?????? ???? ??? ????? ?????? ????? ?? ????. ??? ????? ??? ?????? ????? ?????? ???: ??? ??????? ???????? ???? ??????. ??????? ???? ????????. ????. ???? ?????? ???? ???? ???? ????????????. ????? ?????? ???? ??????. ??????? ?????? ?????? ???? ????? ????? ??? ??? ????? ?? ??? ??????. ??? ???? ???????????? ????? ???? ???????????? ??? ????? ???????? ????? ???? ?? ?? ??? ??? ?? ??????? ????????. ????? ????? ???????????? ???? ????? ?? ???? ???????? ?? ????. ???? ???????????? ????? ????? ?? ??????? ?? ??????????. ????? ???? ???????????? ??? ???? ?????? ??? ?????? ???? ??? ??????? ?????? ??????? ??? ??? ?????? ????????? ?? ?????? ????? ????? ?? ???? ???? ????? ??? ??????. ?? ????? ?? ???? ???? ???????????? ???? ???? ?? ???????? ????? ?? ?? ????. ????? ??? ??????? ??????? ??? ???. ????? ?? ?????? ??????? ??????? ?? ???? ???? ???????????? ???? ????? ???? ??????? ?? ?? ???? ????? ?? ???? ?????. ??? ???? ??????? ????????? ????? ???? ?? ????? ????????? ?? ??????? ???????? ?? ?????? ???? (??? ??? ????)? ???? ??? ??? ?????? ????????? ?? ?????? ????? ???? ????? ?? ???? ????. ???? ??? ???? ???? ????? ?? ???? ??????. ?? ????? ??? ??? ???? ???????? ??????? ??????? ?????? ???? ????? ??????? ?? ????? ?????????. ?? ?????? ????????? ??????? ?? ??????? ???????: ??? ????? ???? ??????? ?????? ??????? ?????? ????. ??? ???? ??????? ?? ?????? ?????? ?? ??????? ????? ?? ?????? ??????. ??? ??? ?????? ????????? ????????: ?? ?????? ????????? ???????? ??? ???? ?????. ??? ???? ?????? ????????? ??? ????? ??????: ????? ???? ??? ???? ??????. ??????? ??? ???? ??????. ????? ??? ???? ?????? ?? ??????. ?? ???????? ???????? ????? ??????? ?????? ???? ???  12 ????? (355 ??) ?? ?????? ?? ??? ??? 5 ?????? (148 ??) ?? ?????? ?? ??? ??? ????? ???? (44 ??) ?? ????????? ???????? ??????. ???? ?????? ?? ???? ?????? ??? ????? ?? ?????? ???? ?? ????? ??? ????? ?????? ?? ???????? ??? ???????. ?? ??????  ?? ?????? ???????? ????????? ?????????? ???????? ?????? ?? ????? ??? ???? ????? ?? ????? ???? ?? ????? ??? ????????????. ?? ??????? ??????? ?????? ??? ?????? ????? ?????? ????????? ???????? ???? ??????? ?? ????? ??????? ??? ???? "????????? ????????" ??? ??????? ?????????? ???????. ?????? ???????? ???????? ????????????? ??????? ???????? ???????? ?????? ??????? ??? ?????? ?? ???? ???? ?? ????? ????? ????. ?? ???? ?????? ?? ???????? ????? ?????? ??????? ??? ???? ?? ????? ?????. ???? ?? ?????? ???????????? (?????????) ?? ?? ????? ?????. ????? ???? ??? ?????? ???????????? ?? ????? ??????? ?????? ????? ?????? ???????????? ??? 15. ???? ??? ?????? ???????????? ?? ?????? 30 ?????? ????? ?? ????? ???????? ??? ??? ????? ????? ????? ?? ????????????. ???? ?? ??? ?????? ?????? ??????? ??????? ??? ??????? ?? ????? ???????. ???? ??????? ???? ????? ??? ???? ????? ?? ??? ?????? ?? ??????? ????. ???? ?? ??? ????????? ????? (????????) ?? ????? ??????? ??????. ????? ??? ???? ??????? ????? ???? ???????? ????? ??? ?? 2,300 ??????? ?? ?????. ???? ?????? ??? ?????? ??????? ?????? ???????? ??? ??????? ?????? ????? "?????? ??????" "low-fat" ?? "?? ????? ??? ????" ?? "????? ?? ??????""nonfat". ???? ??????? ?? ????? ??? ????? ???? ?? ????? ?????? ?? ???????????? ??????? ???? ??????. ???? ??? ??????? ????? ???????? ?????? ??????? ??????? ?? ?????? ???????? ??????? ??? ??????. ?????? ???? ????? ??????? ??????? ?? ?????? ?????? ?? ??????? ????????. ???? ??????? ????? ?????? ??? ????? ????? ?? ????????? ?????? ?????? ???????. ????? ?? ???? ????? ???? ??????? ????????. ???? ??? ?????? ?? ??? ??????? ??????? ????????? ??????? ??????? ??????? ??????? ??????? ??????? ???????. ????? ?????? ?????? ??? ??? ????? ??????? ??? ???????? ????? ?? ????? ??? ????? ????? ??????? ??? ????? ??????. ?????? ?????? ?????? ?? ????? ??? ???? ??????? ?? ???????? ?? ????? ?????. ???? ????? ??????? ?? ??????? ?? ?????? ????? ??????. ????? ??????? ?????  ??????? ???????? ???????? ??????? ???????? ????? ?? ????? ???? ?? ???. ???? ?????? ???? ????? ??? ????? ???? ???? ????? ?????. ????? ??????? ?????? ???????? ??? ??????? ??????? ????????? ?????????? ??????? ???????. ????? ??????? ????? ???????? ??????? ?? ???? ??? ??? ???????????? ???? ????? ?? ???????? ?? ?? ????. ????? 4-6 ?????? (112-168 ????) ?? ?????????? ????? ?????? ?? ???? ??? ??????? ????? ?????? ?? ?????? ??????? ????????? ?? ?????? ?? ??????. ??? ????? (?????) ????? ?? ?????????? ????? ?????? ??????: ????? ????? (28 ????) ?? ?????? ?? ??????? ?? ???????. ???? ?????.  ??? (62 ??????) ?? ??????. ????? ?????? ??? ??????? ???? ????? ??? ???? ????? ??? ????????? ????????? ??????? ????????. ?? ??????? ???? ??? ?? ????????? ??????? ?????. ??????. ????????. ?????. ??????. ???????. ?????. ???????. ???????. ??????. ??????. ?????. ????????? ????. ???????. ???????? ???????? ??? ?? ??? ?????? ??????? ?????? ???????? ?????? ?????? ???????? ???????. ???????. ????????. ???????. ????????. ?????. ????? ??????. ???????. ??????. ?????. ??????. ??????? ???????? ????? ??????. ?????? ?????? ??????? ??? ????? ?????? ?? ??????? ?????? ??????? ?????????? ?????????? ??????? ?????????. ??????? ??? ???????. ???????. ????? ????? ?? ?????. ?????? ??????????? ?????? ????????? ???????. ??????? ???? ?????. ??? ??????? ?????? ??????. ??????. ????????. ??????. ?????? ??????? ?????? ???????? ?????? ?? ?????? ?????? ??? ??????? ?????? ??????? ??????. ???? ?? ???? ??????? ??????? ????? ??????? ??????? ?? ????????? ?????????? ???? ????? ???????. ??? ????? ??????? ??????? ?????? ?????? ?? ?????????. ?? ??????? ?????? ??????? ??????? ??????? ??????? ?? ????. ????????? ????????? ???????. ???????? ??????? ?? ??????? ?? ???? ???????. ?????? ?????? ?????? ?????? ?????? ??????? ?????? ??? ?????? ?????????? ???????? ??????? ???????. ???? ???? ??????? ????????. ?????? ??????????? ?????? ??? ????? ?????? ??????. ??????? ??  ??????. ????? ?????? ?????? ?? ??????. ?????? ???????. ???? ?????? ???????. ?????? ??????? ????? ?? ??? ?? ???? ???? ??????. ????????? ????????? ???????? ??? ????? ?????? ?? ??????. ???? ?? ???? ??????? ??????? ????? ??????? ??????? ?? ????????? ?????????? ???? ????? ???? ??????. ??? ????? ??????? ??????? ?????? ?????? ?? ?????????. ??????? ???? ????? ????? ??? ??????? ??????? ?????? ??????? ?? ?? ????? ??? ?????? ??????? ??? ???? ??????? ?? ????? ??? ??????? ??????? ?????? ?? ??? ?????? ???? ?????? ??????? ?? ?????? ?? ??? ????? ???? ?? ???? ??? ???? ?????? ????? ????? ?????? ??? ?????? ?? ?????????: ??????? ????????? ?????? (  American Diabetes Association): diabetes.org ???????? ??????? ???????? ???????? (Academy of Nutrition and Dietetics): [email or web address] ?????? ?????? ?????? ?????? ?????? ?????? ?????? [employer] of Diabetes and Digestive and Kidney Diseases): [email or web address] ????? ?????? ????? ???? ?????? ???????? (Association of Diabetes Care and Education Specialists): [email or web address] ???? ?? ??????? ???? ?? ???? ????? ????? ???? ???? ??? ??????? ??? ???? (????????) ????? ???? ???? ??? ????? ??????. ??????? ????? ??????? ?????? ??? ?????? ?? ?????? ???? ??????? ??? ??? ???? ???. ??? ?? ???? ????? ??????? ?????? ?? ?????? ?? ?????? ???? ?????? ???? ??? ????? ?????? ????? ?? ????. ??? ?????? ?? ???????????? ??????? ???? ???? ????? ??? ???????? ?????? ????. ???? ?? ????? ???? ???? ???????????? ????? ????? ??????. ??? ????? ?? ??? ????????? ?? ???? ?????? ????????? ???? ?????? ???? ??????? ??????. ???? ?? ?????? ??? ????? ???? ?? ???? ?? ???? ??????? ??????.? Document Revised: 08/04/2019 Document Reviewed: 08/04/2019 Elsevier Patient Education  2021 [employer].

## 2021-04-21 ENCOUNTER — Encounter: Payer: Self-pay | Admitting: Nurse Practitioner

## 2021-05-04 ENCOUNTER — Other Ambulatory Visit: Payer: Self-pay

## 2021-06-12 ENCOUNTER — Other Ambulatory Visit: Payer: Self-pay

## 2021-06-12 ENCOUNTER — Other Ambulatory Visit: Payer: Self-pay | Admitting: Obstetrics and Gynecology

## 2021-06-12 DIAGNOSIS — O24113 Pre-existing diabetes mellitus, type 2, in pregnancy, third trimester: Secondary | ICD-10-CM

## 2021-06-12 NOTE — Telephone Encounter (Signed)
Patient needs to contact PCP

## 2021-06-13 ENCOUNTER — Other Ambulatory Visit: Payer: Self-pay

## 2021-06-15 ENCOUNTER — Other Ambulatory Visit: Payer: Self-pay

## 2021-06-21 ENCOUNTER — Other Ambulatory Visit: Payer: Self-pay

## 2021-06-22 ENCOUNTER — Ambulatory Visit: Payer: Medicaid Other | Admitting: Nurse Practitioner

## 2021-06-28 ENCOUNTER — Other Ambulatory Visit: Payer: Self-pay

## 2021-06-28 ENCOUNTER — Ambulatory Visit (INDEPENDENT_AMBULATORY_CARE_PROVIDER_SITE_OTHER): Payer: Medicaid Other | Admitting: Nurse Practitioner

## 2021-06-28 ENCOUNTER — Encounter: Payer: Self-pay | Admitting: Nurse Practitioner

## 2021-06-28 VITALS — BP 114/68 | HR 80 | Temp 98.2°F | Ht 67.0 in | Wt 153.0 lb

## 2021-06-28 DIAGNOSIS — Z13220 Encounter for screening for lipoid disorders: Secondary | ICD-10-CM

## 2021-06-28 DIAGNOSIS — Z1329 Encounter for screening for other suspected endocrine disorder: Secondary | ICD-10-CM | POA: Diagnosis not present

## 2021-06-28 DIAGNOSIS — E1165 Type 2 diabetes mellitus with hyperglycemia: Secondary | ICD-10-CM | POA: Diagnosis not present

## 2021-06-28 DIAGNOSIS — D649 Anemia, unspecified: Secondary | ICD-10-CM | POA: Diagnosis not present

## 2021-06-28 DIAGNOSIS — Z794 Long term (current) use of insulin: Secondary | ICD-10-CM | POA: Diagnosis not present

## 2021-06-28 DIAGNOSIS — E119 Type 2 diabetes mellitus without complications: Secondary | ICD-10-CM

## 2021-06-28 LAB — POCT URINALYSIS DIP (CLINITEK)
Bilirubin, UA: NEGATIVE
Blood, UA: NEGATIVE
Glucose, UA: 500 mg/dL — AB
Ketones, POC UA: NEGATIVE mg/dL
Leukocytes, UA: NEGATIVE
Nitrite, UA: NEGATIVE
POC PROTEIN,UA: 30 — AB
Spec Grav, UA: >=1.03 — AB (ref 1.010–1.025)
Urobilinogen, UA: 0.2 E.U./dL
pH, UA: 6 (ref 5.0–8.0)

## 2021-06-28 LAB — POCT GLYCOSYLATED HEMOGLOBIN (HGB A1C)
HbA1c POC (<> result, manual entry): 8.7 % (ref 4.0–5.6)
HbA1c, POC (controlled diabetic range): 8.7 % — AB (ref 0.0–7.0)
HbA1c, POC (prediabetic range): 8.7 % — AB (ref 5.7–6.4)
Hemoglobin A1C: 8.7 % — AB (ref 4.0–5.6)

## 2021-06-28 MED ORDER — FREESTYLE LIBRE 2 SENSOR MISC
1.0000 "application " | 5 refills | Status: AC
  Filled 2021-06-28: qty 2, 28d supply, fill #0

## 2021-06-28 MED ORDER — FREESTYLE LIBRE 2 READER DEVI
1.0000 | 2 refills | Status: AC
  Filled 2021-06-28: qty 1, 14d supply, fill #0

## 2021-06-28 NOTE — Progress Notes (Signed)
[place], [place]  [address] Phone:  [phone]   Fax:  [phone]   Established Patient Office Visit  Subjective:  Patient ID: Tina Brown, female    DOB: 1986/03/14  Age: 35 y.o. MRN: [id number]  CC:  Chief Complaint  Patient presents with   Follow-up    PT is here for FU. Pt stated vision is blurry sometimes    HPI Tina Brown presents for follow up.  She is in today follow up for her DM. Her previous A1c was 9.1% today  She is prescribed Lantus 20 units BID. Her CBG range is 8.7% enies headache, dizziness, visual changes, shortness of breath, dyspnea on exertion, chest pain, nausea, vomiting or any edema. She continues to breastfeed.  Requesting continuous glucose monitor.  Past Medical History:  Diagnosis Date   Diabetes mellitus without complication ([place])    Fibroid    Foot callus 08/2019   Hypertension    In vitro fertilization     Past Surgical History:  Procedure Laterality Date   CESAREAN SECTION N/A 10/18/2020   Procedure: CESAREAN SECTION;  Surgeon: Gwynne Edinger, MD;  Location: [facility];  Service: Obstetrics;  Laterality: N/A;   IVF      Family History  Problem Relation Age of Onset   Diabetes Mother    Hypertension Mother    Diabetes Father    Hypertension Father    Diabetes Maternal Grandmother    Hypertension Maternal Grandmother    Diabetes Maternal Grandfather    Hypertension Maternal Grandfather    Diabetes Paternal Grandmother    Hypertension Paternal Grandmother    Diabetes Paternal Grandfather    Hypertension Paternal Grandfather     Social History   Socioeconomic History   Marital status: Married    Spouse name: Not on file   Number of children: Not on file   Years of education: Not on file   Highest education level: Not on file  Occupational History   Not on file  Tobacco Use   Smoking status: Never   Smokeless tobacco: Never  Vaping Use   Vaping Use: Never used   Substance and Sexual Activity   Alcohol use: No   Drug use: No   Sexual activity: Yes    Partners: Male    Birth control/protection: None  Other Topics Concern   Not on file  Social History Narrative   Not on file   Social Determinants of Health   Financial Resource Strain: Not on file  Food Insecurity: No Food Insecurity   Worried About Running Out of Food in the Last Year: Never true   [place] in the Last Year: Never true  Transportation Needs: No Transportation Needs   Lack of Transportation (Medical): No   Lack of Transportation (Non-Medical): No  Physical Activity: Not on file  Stress: Not on file  Social Connections: Not on file  Intimate Partner Violence: Not on file    Outpatient Medications Prior to Visit  Medication Sig Dispense Refill   Blood Glucose Monitoring Suppl (ACCU-CHEK GUIDE ME) w/Device KIT PLEASE CHECK BLOOD GLUCOSE 4 TIMES DAILY.     insulin glargine (LANTUS) 100 UNIT/ML Solostar Pen Inject 20 Units into the skin 2 (two) times daily. 12 mL 11   Insulin Pen Needle 31G X 5 MM MISC Use as directed 2 times daily. 100 each 11   Prenatal Vit-Fe Fumarate-FA (PREPLUS) 27-1 MG TABS Take 1 tablet by mouth  daily. 30 tablet 6   metFORMIN (GLUCOPHAGE) 1000 MG tablet Take 1 tablet (1,000 mg total) by mouth 2 (two) times daily. With meals (Patient not taking: Reported on 06/28/2021) 180 tablet 3   No facility-administered medications prior to visit.    No Known Allergies  ROS Review of Systems    Objective:    Physical Exam HENT:     Head: Normocephalic and atraumatic.  Cardiovascular:     Rate and Rhythm: Normal rate and regular rhythm.     Pulses: Normal pulses.     Heart sounds: Normal heart sounds.  Pulmonary:     Effort: Pulmonary effort is normal.     Breath sounds: Normal breath sounds.  Abdominal:     Palpations: Abdomen is soft.  Musculoskeletal:        General: Normal range of motion.     Cervical back: Normal range of motion.      Right lower leg: No edema.     Left lower leg: No edema.  Skin:    General: Skin is warm and dry.     Capillary Refill: Capillary refill takes less than 2 seconds.  Neurological:     General: No focal deficit present.     Mental Status: She is alert and oriented to person, place, and time.  Psychiatric:        Mood and Affect: Mood normal.        Behavior: Behavior normal.        Thought Content: Thought content normal.        Judgment: Judgment normal.    BP 114/68    Pulse 80    Temp 98.2 F (36.8 C)    Ht _0  (1.702 m)    Wt 153 lb (69.4 kg)    LMP 06/10/2021 (Exact Date)    SpO2 100%    BMI 23.96 kg/m  Wt Readings from Last 3 Encounters:  06/28/21 153 lb (69.4 kg)  04/20/21 154 lb 0.6 oz (69.9 kg)  03/23/21 153 lb (69.4 kg)     There are no preventive care reminders to display for this patient.   There are no preventive care reminders to display for this patient.  Lab Results  Component Value Date   TSH 0.68 05/31/2016   Lab Results  Component Value Date   WBC 11.4 (H) 10/19/2020   HGB 9.4 (L) 10/19/2020   HCT 28.1 (L) 10/19/2020   MCV 88.1 10/19/2020   PLT 150 10/19/2020   Lab Results  Component Value Date   NA 138 12/21/2020   K 4.1 12/21/2020   CO2 23 10/18/2020   GLUCOSE 154 (H) 12/21/2020   BUN 13 12/21/2020   CREATININE 0.61 12/21/2020   BILITOT <0.2 12/21/2020   ALKPHOS 87 12/21/2020   AST 18 12/21/2020   ALT 37 10/18/2020   PROT 7.5 12/21/2020   ALBUMIN 4.5 12/21/2020   CALCIUM 9.5 12/21/2020   ANIONGAP 4 (L) 10/18/2020   EGFR 118 12/21/2020   No results found for: CHOL No results found for: HDL No results found for: LDLCALC No results found for: TRIG No results found for: [facility] Lab Results  Component Value Date   HGBA1C 8.7 (A) 06/28/2021   HGBA1C 8.7 06/28/2021   HGBA1C 8.7 (A) 06/28/2021   HGBA1C 8.7 (A) 06/28/2021      Assessment & Plan:   Problem List Items Addressed This Visit   None Visit Diagnoses     Type 2  diabetes mellitus with hyperglycemia, with  long-term current use of insulin ([facility])    -  Primary Stable Encourage compliance with current treatment regimen Encourage regular CBG monitoring Encourage contacting office if excessive hyperglycemia and or hypoglycemia Lifestyle modification with healthy diet (fewer calories, more high fiber foods, whole grains and non-starchy vegetables, lower fat meat and fish, low-fat diary include healthy oils) regular exercise (physical activity)  Opthalmology exam completed 2022 Home BP monitoring also encouraged goal <130/80    Relevant Orders   HgB A1c (Completed)   POCT URINALYSIS DIP (CLINITEK) (Completed)   Comp. Metabolic Panel (12)   Anemia, unspecified type     Reevaluation    Relevant Orders   CBC with Differential/Platelet   Screening for cholesterol level       Relevant Orders   Lipid panel   Screening for thyroid disorder       Relevant Orders   TSH   Hemoglobin A1C between 7% and 9% indicating borderline diabetic control ([place])           Meds ordered this encounter  Medications   Continuous Blood Gluc Sensor (FREESTYLE LIBRE 2 SENSOR) MISC    Sig: 1 application by Does not apply route every 14 (fourteen) days.    Dispense:  2 each    Refill:  5    Order Specific Question:   Supervising Provider    Answer:   Tresa Garter [[id number]]   Continuous Blood Gluc Receiver (FREESTYLE LIBRE 2 READER) DEVI    Sig: 1 applicator by Does not apply route every 14 (fourteen) days.    Dispense:  1 each    Refill:  2    Order Specific Question:   Supervising Provider    Answer:   Tresa Garter [id number]     Follow-up: Return in about 3 months (around 09/26/2021) for follow up [id number].    Vevelyn Francois, NP

## 2021-06-28 NOTE — Patient Instructions (Addendum)
Diabetes Mellitus and Nutrition, Adult When you have diabetes, or diabetes mellitus, it is very important to have healthy eating habits because your blood sugar (glucose) levels are greatly affected by what you eat and drink. Eating healthy foods in the right amounts, at about the same times every day, can help you: Manage your blood glucose. Lower your risk of heart disease. Improve your blood pressure. Reach or maintain a healthy weight. What can affect my meal plan? Every person with diabetes is different, and each person has different needs for a meal plan. Your health care provider may recommend that you work with a dietitian to make a meal plan that is best for you. Your meal plan may vary depending on factors such as: The calories you need. The medicines you take. Your weight. Your blood glucose, blood pressure, and cholesterol levels. Your activity level. Other health conditions you have, such as heart or kidney disease. How do carbohydrates affect me? Carbohydrates, also called carbs, affect your blood glucose level more than any other type of food. Eating carbs raises the amount of glucose in your blood. It is important to know how many carbs you can safely have in each meal. This is different for every person. Your dietitian can help you calculate how many carbs you should have at each meal and for each snack. How does alcohol affect me? Alcohol can cause a decrease in blood glucose (hypoglycemia), especially if you use insulin or take certain diabetes medicines by mouth. Hypoglycemia can be a life-threatening condition. Symptoms of hypoglycemia, such as sleepiness, dizziness, and confusion, are similar to symptoms of having too much alcohol. Do not drink alcohol if: Your health care provider tells you not to drink. You are pregnant, may be pregnant, or are planning to become pregnant. If you drink alcohol: Limit how much you have to: 0-1 drink a day for women. 0-2 drinks a day  for men. Know how much alcohol is in your drink. In the [place] one drink equals one 12 oz bottle of beer (355 mL), one 5 oz glass of wine (148 mL), or one 1 oz glass of hard liquor (44 mL). Keep yourself hydrated with water, diet soda, or unsweetened iced tea. Keep in mind that regular soda, juice, and other mixers may contain a lot of sugar and must be counted as carbs. What are tips for following this plan? Reading food labels Start by checking the serving size on the Nutrition Facts label of packaged foods and drinks. The number of calories and the amount of carbs, fats, and other nutrients listed on the label are based on one serving of the item. Many items contain more than one serving per package. Check the total grams (g) of carbs in one serving. Check the number of grams of saturated fats and trans fats in one serving. Choose foods that have a low amount or none of these fats. Check the number of milligrams (mg) of salt (sodium) in one serving. Most people should limit total sodium intake to less than 2,300 mg per day. Always check the nutrition information of foods labeled as "low-fat" or "nonfat." These foods may be higher in added sugar or refined carbs and should be avoided. Talk to your dietitian to identify your daily goals for nutrients listed on the label. Shopping Avoid buying canned, pre-made, or processed foods. These foods tend to be high in fat, sodium, and added sugar. Shop around the outside edge of the grocery store. This is where you will  most often find fresh fruits and vegetables, bulk grains, fresh meats, and fresh dairy products. Cooking Use low-heat cooking methods, such as baking, instead of high-heat cooking methods, such as deep frying. Cook using healthy oils, such as olive, canola, or sunflower oil. Avoid cooking with butter, cream, or high-fat meats. Meal planning Eat meals and snacks regularly, preferably at the same times every day. Avoid going long periods of  time without eating. Eat foods that are high in fiber, such as fresh fruits, vegetables, beans, and whole grains. Eat 4-6 oz (112-168 g) of lean protein each day, such as lean meat, chicken, fish, eggs, or tofu. One ounce (oz) (28 g) of lean protein is equal to: 1 oz (28 g) of meat, chicken, or fish. 1 egg.  cup (62 g) of tofu. Eat some foods each day that contain healthy fats, such as avocado, nuts, seeds, and fish. What foods should I eat? Fruits Berries. Apples. Oranges. Peaches. Apricots. Plums. Grapes. Mangoes. Papayas. Pomegranates. Kiwi. Cherries. Vegetables Leafy greens, including lettuce, spinach, kale, chard, collard greens, mustard greens, and cabbage. Beets. Cauliflower. Broccoli. Carrots. Green beans. Tomatoes. Peppers. Onions. Cucumbers. Brussels sprouts. Grains Whole grains, such as whole-wheat or whole-grain bread, crackers, tortillas, cereal, and pasta. Unsweetened oatmeal. Quinoa. Brown or wild rice. Meats and other proteins Seafood. Poultry without skin. Lean cuts of poultry and beef. Tofu. Nuts. Seeds. Dairy Low-fat or fat-free dairy products such as milk, yogurt, and cheese. The items listed above may not be a complete list of foods and beverages you can eat and drink. Contact a dietitian for more information. What foods should I avoid? Fruits Fruits canned with syrup. Vegetables Canned vegetables. Frozen vegetables with butter or cream sauce. Grains Refined white flour and flour products such as bread, pasta, snack foods, and cereals. Avoid all processed foods. Meats and other proteins Fatty cuts of meat. Poultry with skin. Breaded or fried meats. Processed meat. Avoid saturated fats. Dairy Full-fat yogurt, cheese, or milk. Beverages Sweetened drinks, such as soda or iced tea. The items listed above may not be a complete list of foods and beverages you should avoid. Contact a dietitian for more information. Questions to ask a health care provider Do I need to  meet with a certified diabetes care and education specialist? Do I need to meet with a dietitian? What number can I call if I have questions? When are the best times to check my blood glucose? Where to find more information: American Diabetes Association: diabetes.org Academy of Nutrition and Dietetics: eatright.[employer] of Diabetes and Digestive and Kidney Diseases: Amen[employer].is Association of Diabetes Care & Education Specialists: diabeteseducator.org Summary It is important to have healthy eating habits because your blood sugar (glucose) levels are greatly affected by what you eat and drink. It is important to use alcohol carefully. A healthy meal plan will help you manage your blood glucose and lower your risk of heart disease. Your health care provider may recommend that you work with a dietitian to make a meal plan that is best for you. This information is not intended to replace advice given to you by your health care provider. Make sure you discuss any questions you have with your health care provider. Document Revised: 02/02/2020 Document Reviewed: 02/02/2020 Elsevier Patient Education  [place] 64-69 Years Old, Female Preventive care refers to lifestyle choices and visits with your health care provider that can promote health and wellness. Preventive care visits are also called wellness exams. What can I  expect for my preventive care visit? Counseling During your preventive care visit, your health care provider may ask about your: Medical history, including: Past medical problems. Family medical history. Pregnancy history. Current health, including: Menstrual cycle. Method of birth control. Emotional well-being. Home life and relationship well-being. Sexual activity and sexual health. Lifestyle, including: Alcohol, nicotine or tobacco, and drug use. Access to firearms. Diet, exercise, and sleep habits. Work and work  Statistician. Sunscreen use. Safety issues such as seatbelt and bike helmet use. Physical exam Your health care provider may check your: Height and weight. These may be used to calculate your BMI (body mass index). BMI is a measurement that tells if you are at a healthy weight. Waist circumference. This measures the distance around your waistline. This measurement also tells if you are at a healthy weight and may help predict your risk of certain diseases, such as type 2 diabetes and high blood pressure. Heart rate and blood pressure. Body temperature. Skin for abnormal spots. What immunizations do I need? Vaccines are usually given at various ages, according to a schedule. Your health care provider will recommend vaccines for you based on your age, medical history, and lifestyle or other factors, such as travel or where you work. What tests do I need? Screening Your health care provider may recommend screening tests for certain conditions. This may include: Pelvic exam and Pap test. Lipid and cholesterol levels. Diabetes screening. This is done by checking your blood sugar (glucose) after you have not eaten for a while (fasting). Hepatitis B test. Hepatitis C test. HIV (human immunodeficiency virus) test. STI (sexually transmitted infection) testing, if you are at risk. BRCA-related cancer screening. This may be done if you have a family history of breast, ovarian, tubal, or peritoneal cancers. Talk with your health care provider about your test results, treatment options, and if necessary, the need for more tests. Follow these instructions at home: Eating and drinking  Eat a healthy diet that includes fresh fruits and vegetables, whole grains, lean protein, and low-fat dairy products. Take vitamin and mineral supplements as recommended by your health care provider. Do not drink alcohol if: Your health care provider tells you not to drink. You are pregnant, may be pregnant, or are  planning to become pregnant. If you drink alcohol: Limit how much you have to 0-1 drink a day. Know how much alcohol is in your drink. In the [place].S., one drink equals one 12 oz bottle of beer (355 mL), one 5 oz glass of wine (148 mL), or one 1 oz glass of hard liquor (44 mL). Lifestyle Brush your teeth every morning and night with fluoride toothpaste. Floss one time each day. Exercise for at least 30 minutes 5 or more days each week. Do not use any products that contain nicotine or tobacco. These products include cigarettes, chewing tobacco, and vaping devices, such as e-cigarettes. If you need help quitting, ask your health care provider. Do not use drugs. If you are sexually active, practice safe sex. Use a condom or other form of protection to prevent STIs. If you do not wish to become pregnant, use a form of birth control. If you plan to become pregnant, see your health care provider for a prepregnancy visit. Find healthy ways to manage stress, such as: Meditation, yoga, or listening to music. Journaling. Talking to a trusted person. Spending time with friends and family. Minimize exposure to UV radiation to reduce your risk of skin cancer. Safety Always wear your seat belt while  driving or riding in a vehicle. Do not drive: If you have been drinking alcohol. Do not ride with someone who has been drinking. If you have been using any mind-altering substances or drugs. While texting. When you are tired or distracted. Wear a helmet and other protective equipment during sports activities. If you have firearms in your house, make sure you follow all gun safety procedures. Seek help if you have been physically or sexually abused. What's next? Go to your health care provider once a year for an annual wellness visit. Ask your health care provider how often you should have your eyes and teeth checked. Stay up to date on all vaccines. This information is not intended to replace advice given  to you by your health care provider. Make sure you discuss any questions you have with your health care provider. Document Revised: 12/27/2020 Document Reviewed: 12/27/2020 Elsevier Patient Education  [place].

## 2021-06-29 LAB — TSH: TSH: 0.862 u[IU]/mL (ref 0.450–4.500)

## 2021-06-29 LAB — CBC WITH DIFFERENTIAL/PLATELET
Basophils Absolute: 0.1 10*3/uL (ref 0.0–0.2)
Basos: 2 %
EOS (ABSOLUTE): 0.3 10*3/uL (ref 0.0–0.4)
Eos: 7 %
Hematocrit: 42.2 % (ref 34.0–46.6)
Hemoglobin: 13.5 g/dL (ref 11.1–15.9)
Immature Grans (Abs): 0 10*3/uL (ref 0.0–0.1)
Immature Granulocytes: 0 %
Lymphocytes Absolute: 1.8 10*3/uL (ref 0.7–3.1)
Lymphs: 45 %
MCH: 27.6 pg (ref 26.6–33.0)
MCHC: 32 g/dL (ref 31.5–35.7)
MCV: 86 fL (ref 79–97)
Monocytes Absolute: 0.4 10*3/uL (ref 0.1–0.9)
Monocytes: 10 %
Neutrophils Absolute: 1.5 10*3/uL (ref 1.4–7.0)
Neutrophils: 36 %
Platelets: 262 10*3/uL (ref 150–450)
RBC: 4.9 x10E6/uL (ref 3.77–5.28)
RDW: 13.2 % (ref 11.7–15.4)
WBC: 4.1 10*3/uL (ref 3.4–10.8)

## 2021-06-29 LAB — COMP. METABOLIC PANEL (12)
AST: 15 IU/L (ref 0–40)
Albumin/Globulin Ratio: 1.6 (ref 1.2–2.2)
Albumin: 4.5 g/dL (ref 3.8–4.8)
Alkaline Phosphatase: 81 IU/L (ref 44–121)
BUN/Creatinine Ratio: 23 (ref 9–23)
BUN: 16 mg/dL (ref 6–20)
Bilirubin Total: 0.3 mg/dL (ref 0.0–1.2)
Calcium: 9.9 mg/dL (ref 8.7–10.2)
Chloride: 106 mmol/L (ref 96–106)
Creatinine, Ser: 0.69 mg/dL (ref 0.57–1.00)
Globulin, Total: 2.8 g/dL (ref 1.5–4.5)
Glucose: 256 mg/dL — ABNORMAL HIGH (ref 70–99)
Potassium: 4.5 mmol/L (ref 3.5–5.2)
Sodium: 142 mmol/L (ref 134–144)
Total Protein: 7.3 g/dL (ref 6.0–8.5)
eGFR: 115 mL/min/{1.73_m2} (ref >59)

## 2021-06-29 LAB — LIPID PANEL
Chol/HDL Ratio: 2.8 ratio (ref 0.0–4.4)
Cholesterol, Total: 201 mg/dL — ABNORMAL HIGH (ref 100–199)
HDL: 71 mg/dL (ref >39)
LDL Chol Calc (NIH): 119 mg/dL — ABNORMAL HIGH (ref 0–99)
Triglycerides: 60 mg/dL (ref 0–149)
VLDL Cholesterol Cal: 11 mg/dL (ref 5–40)

## 2021-07-03 ENCOUNTER — Other Ambulatory Visit: Payer: Self-pay

## 2021-07-11 ENCOUNTER — Other Ambulatory Visit: Payer: Self-pay

## 2021-07-17 ENCOUNTER — Other Ambulatory Visit: Payer: Self-pay

## 2021-07-18 ENCOUNTER — Other Ambulatory Visit: Payer: Self-pay

## 2021-08-07 ENCOUNTER — Other Ambulatory Visit: Payer: Self-pay

## 2021-08-14 ENCOUNTER — Telehealth: Payer: Self-pay

## 2021-08-14 NOTE — Telephone Encounter (Signed)
Pt stated that her insurance didn't cover her glucose med   She would like for her to get a 3 months supply of her med's cause she will be traveling to her country

## 2021-08-15 ENCOUNTER — Other Ambulatory Visit: Payer: Self-pay

## 2021-08-15 ENCOUNTER — Other Ambulatory Visit: Payer: Self-pay | Admitting: Nurse Practitioner

## 2021-08-15 DIAGNOSIS — E1165 Type 2 diabetes mellitus with hyperglycemia: Secondary | ICD-10-CM

## 2021-08-15 MED ORDER — INSULIN GLARGINE 100 UNIT/ML SOLOSTAR PEN
20.0000 [IU] | PEN_INJECTOR | Freq: Two times a day (BID) | SUBCUTANEOUS | 2 refills | Status: DC
  Filled 2021-08-15: qty 12, 30d supply, fill #0
  Filled 2021-08-20: qty 36, 90d supply, fill #0

## 2021-08-15 NOTE — Telephone Encounter (Signed)
The refill has been sent. Thanks

## 2021-08-20 ENCOUNTER — Other Ambulatory Visit: Payer: Self-pay

## 2021-08-20 ENCOUNTER — Other Ambulatory Visit: Payer: Self-pay | Admitting: Obstetrics and Gynecology

## 2021-08-20 DIAGNOSIS — O24113 Pre-existing diabetes mellitus, type 2, in pregnancy, third trimester: Secondary | ICD-10-CM

## 2021-08-21 ENCOUNTER — Other Ambulatory Visit: Payer: Self-pay

## 2021-08-22 ENCOUNTER — Other Ambulatory Visit: Payer: Self-pay | Admitting: Family Medicine

## 2021-08-30 ENCOUNTER — Telehealth: Payer: Self-pay

## 2021-08-30 NOTE — Telephone Encounter (Signed)
Test strip refill  [facility]

## 2021-09-01 ENCOUNTER — Other Ambulatory Visit: Payer: Self-pay | Admitting: Nurse Practitioner

## 2021-09-01 DIAGNOSIS — O24113 Pre-existing diabetes mellitus, type 2, in pregnancy, third trimester: Secondary | ICD-10-CM

## 2021-09-01 MED ORDER — ACCU-CHEK GUIDE VI STRP
ORAL_STRIP | 6 refills | Status: DC
  Filled 2021-09-01: qty 100, 25d supply, fill #0
  Filled 2022-05-14: qty 100, 25d supply, fill #1
  Filled 2022-07-24: qty 100, 25d supply, fill #2

## 2021-09-01 NOTE — Telephone Encounter (Signed)
The refill has been sent. Thanks

## 2021-09-03 ENCOUNTER — Other Ambulatory Visit: Payer: Self-pay | Admitting: Nurse Practitioner

## 2021-09-03 ENCOUNTER — Other Ambulatory Visit: Payer: Self-pay

## 2021-09-03 DIAGNOSIS — O24113 Pre-existing diabetes mellitus, type 2, in pregnancy, third trimester: Secondary | ICD-10-CM

## 2021-09-04 ENCOUNTER — Other Ambulatory Visit: Payer: Self-pay

## 2021-09-24 ENCOUNTER — Other Ambulatory Visit: Payer: Self-pay

## 2021-09-24 ENCOUNTER — Other Ambulatory Visit: Payer: Self-pay | Admitting: Family Medicine

## 2021-09-26 ENCOUNTER — Ambulatory Visit: Payer: Medicaid Other | Admitting: Nurse Practitioner

## 2022-03-14 ENCOUNTER — Other Ambulatory Visit: Payer: Self-pay | Admitting: Nurse Practitioner

## 2022-03-14 ENCOUNTER — Other Ambulatory Visit: Payer: Self-pay

## 2022-03-14 ENCOUNTER — Telehealth: Payer: Self-pay

## 2022-03-14 DIAGNOSIS — E1165 Type 2 diabetes mellitus with hyperglycemia: Secondary | ICD-10-CM

## 2022-03-14 MED ORDER — INSULIN GLARGINE 100 UNIT/ML SOLOSTAR PEN
20.0000 [IU] | PEN_INJECTOR | Freq: Two times a day (BID) | SUBCUTANEOUS | 2 refills | Status: DC
  Filled 2022-03-14: qty 12, 30d supply, fill #0

## 2022-03-14 NOTE — Telephone Encounter (Signed)
Insulin     [facility]

## 2022-03-19 ENCOUNTER — Other Ambulatory Visit: Payer: Self-pay

## 2022-03-29 ENCOUNTER — Encounter: Payer: Self-pay | Admitting: Nurse Practitioner

## 2022-04-18 ENCOUNTER — Ambulatory Visit (INDEPENDENT_AMBULATORY_CARE_PROVIDER_SITE_OTHER): Payer: Medicaid Other | Admitting: Nurse Practitioner

## 2022-04-18 ENCOUNTER — Encounter: Payer: Self-pay | Admitting: Nurse Practitioner

## 2022-04-18 ENCOUNTER — Telehealth: Payer: Self-pay

## 2022-04-18 ENCOUNTER — Other Ambulatory Visit: Payer: Self-pay

## 2022-04-18 VITALS — BP 107/67 | HR 79 | Temp 98.1°F | Ht 67.0 in | Wt 132.5 lb

## 2022-04-18 DIAGNOSIS — Z794 Long term (current) use of insulin: Secondary | ICD-10-CM

## 2022-04-18 DIAGNOSIS — E1165 Type 2 diabetes mellitus with hyperglycemia: Secondary | ICD-10-CM

## 2022-04-18 DIAGNOSIS — R634 Abnormal weight loss: Secondary | ICD-10-CM

## 2022-04-18 LAB — POCT GLYCOSYLATED HEMOGLOBIN (HGB A1C)
HbA1c POC (<> result, manual entry): 8.8 % (ref 4.0–5.6)
HbA1c, POC (controlled diabetic range): 8.8 % — AB (ref 0.0–7.0)
HbA1c, POC (prediabetic range): 8.8 % — AB (ref 5.7–6.4)
Hemoglobin A1C: 8.8 % — AB (ref 4.0–5.6)

## 2022-04-18 MED ORDER — INSULIN GLARGINE 100 UNIT/ML SOLOSTAR PEN
20.0000 [IU] | PEN_INJECTOR | Freq: Two times a day (BID) | SUBCUTANEOUS | 2 refills | Status: DC
  Filled 2022-04-18: qty 12, 30d supply, fill #0
  Filled 2022-05-14: qty 12, 30d supply, fill #1

## 2022-04-18 NOTE — Patient Instructions (Signed)
1. Type 2 diabetes mellitus with hyperglycemia, with long-term current use of insulin (HCC)  - POCT glycosylated hemoglobin (Hb A1C) - CBC - Comprehensive metabolic panel - insulin glargine (LANTUS) 100 UNIT/ML Solostar Pen; Inject 20 Units into the skin 2 (two) times daily.  Dispense: 12 mL; Refill: 2 - TSH - AMB Referral to Pharmacy Medication Management  2. Weight loss  - TSH   Follow up:  Follow up in 3 months

## 2022-04-18 NOTE — Assessment & Plan Note (Signed)
-   POCT glycosylated hemoglobin (Hb A1C) - CBC - Comprehensive metabolic panel - insulin glargine (LANTUS) 100 UNIT/ML Solostar Pen; Inject 20 Units into the skin 2 (two) times daily.  Dispense: 12 mL; Refill: 2 - TSH - AMB Referral to Pharmacy Medication Management  2. Weight loss  - TSH   Follow up:  Follow up in 3 months

## 2022-04-18 NOTE — Chronic Care Management (AMB) (Signed)
   Care Guide Note  04/18/2022 Name: [name] MRN: [id number] DOB: 28-Aug-1985  Referred by: Fenton Foy, NP Reason for referral : Care Coordination (Outreach to schedule referral with Pharm d )   Tina Brown is a 36 y.o. year old female who is a primary care patient of Fenton Foy, NP. Lundyn Jankowski was referred to the pharmacist for assistance related to DM.    An unsuccessful telephone outreach was attempted today to contact the patient who was referred to the pharmacy team for assistance with medication management. Additional attempts will be made to contact the patient.   Noreene Larsson, [place], [place] [address] Direct Dial: [phone] [email or web address]

## 2022-04-18 NOTE — Progress Notes (Signed)
_0  ID: Tina Brown, female    DOB: 07/13/1986, 36 y.o.   MRN: [id number]  Chief Complaint  Patient presents with   Diabetes    Pt is here for 3 month's DM follow up visit. Pt states she feels the diabetes is affecting her teeth patient is concern about weight loss    Referring provider: No ref. provider found  HPI  36 year old female with history of diabetes.  Patient presents today for diabetic follow-up.  She is currently on Lantus twice daily.  She states that she is compliant with medications.  Her A1c is slightly elevated from last check.  We will refill her medications and consult pharmacist for diabetic medication management.  Is also concerned that she has lost 20 pounds recently.  She states that her appetite has not changed and she is not exercising more than usual.  We will check blood work today. Denies f/c/s, n/v/d, hemoptysis, PND, leg swelling Denies chest pain or edema        No Known Allergies  Immunization History  Administered Date(s) Administered   Influenza,inj,Quad PF,6+ Mos 04/19/2016, 04/13/2017, 05/12/2018, 06/27/2020   Tdap 04/19/2016, 08/17/2020    Past Medical History:  Diagnosis Date   Diabetes mellitus without complication ([facility])    Fibroid    Foot callus 08/2019   Hypertension    In vitro fertilization     Tobacco History: Social History   Tobacco Use  Smoking Status Never  Smokeless Tobacco Never   Counseling given: Not Answered   Outpatient Encounter Medications as of 04/18/2022  Medication Sig   Blood Glucose Monitoring Suppl (ACCU-CHEK GUIDE ME) w/Device KIT PLEASE CHECK BLOOD GLUCOSE 4 TIMES DAILY.   glucose blood (ACCU-CHEK GUIDE) test strip check blood glucose 4 times daily as directed   [DISCONTINUED] insulin glargine (LANTUS) 100 UNIT/ML Solostar Pen Inject 20 Units into the skin 2 (two) times daily.   insulin glargine (LANTUS) 100 UNIT/ML Solostar Pen Inject 20 Units into the skin 2 (two) times daily.   [DISCONTINUED]  glipiZIDE (GLUCOTROL) 10 MG tablet Take 1 tablet (10 mg total) by mouth 2 (two) times daily before a meal.   [DISCONTINUED] Prenatal Vit-Fe Fumarate-FA (PREPLUS) 27-1 MG TABS Take 1 tablet by mouth daily. (Patient not taking: Reported on 04/18/2022)   No facility-administered encounter medications on file as of 04/18/2022.     Review of Systems  Review of Systems  Constitutional: Negative.   HENT: Negative.    Cardiovascular: Negative.   Gastrointestinal: Negative.   Allergic/Immunologic: Negative.   Neurological: Negative.   Psychiatric/Behavioral: Negative.         Physical Exam  BP 107/67 (BP Location: Brown Arm, Patient Position: Sitting, Cuff Size: Normal)   Pulse 79   Temp 98.1 F (36.7 C)   Ht _1  (1.702 m)   Wt 132 lb 8 oz (60.1 kg)   SpO2 100%   BMI 20.75 kg/m   Wt Readings from Last 5 Encounters:  04/18/22 132 lb 8 oz (60.1 kg)  06/28/21 153 lb (69.4 kg)  04/20/21 154 lb 0.6 oz (69.9 kg)  03/23/21 153 lb (69.4 kg)  12/04/20 154 lb (69.9 kg)     Physical Exam Vitals and nursing note reviewed.  Constitutional:      General: She is not in acute distress.    Appearance: She is well-developed.  Cardiovascular:     Rate and Rhythm: Normal rate and regular rhythm.  Pulmonary:     Effort: Pulmonary effort is normal.  Breath sounds: Normal breath sounds.  Neurological:     Mental Status: She is alert and oriented to person, place, and time.      Lab Results:  CBC    Component Value Date/Time   WBC 4.1 06/28/2021 1027   WBC 11.4 (H) 10/19/2020 0551   RBC 4.90 06/28/2021 1027   RBC 3.19 (L) 10/19/2020 0551   HGB 13.5 06/28/2021 1027   HCT 42.2 06/28/2021 1027   PLT 262 06/28/2021 1027   MCV 86 06/28/2021 1027   MCH 27.6 06/28/2021 1027   MCH 29.5 10/19/2020 0551   MCHC 32.0 06/28/2021 1027   MCHC 33.5 10/19/2020 0551   RDW 13.2 06/28/2021 1027   LYMPHSABS 1.8 06/28/2021 1027   MONOABS 287 05/31/2016 1111   EOSABS 0.3 06/28/2021 1027    BASOSABS 0.1 06/28/2021 1027    BMET    Component Value Date/Time   NA 142 06/28/2021 1027   K 4.5 06/28/2021 1027   CL 106 06/28/2021 1027   CO2 23 10/18/2020 1528   GLUCOSE 256 (H) 06/28/2021 1027   GLUCOSE 111 (H) 10/19/2020 0551   BUN 16 06/28/2021 1027   CREATININE 0.69 06/28/2021 1027   CREATININE 0.65 05/31/2016 1111   CALCIUM 9.9 06/28/2021 1027   GFRNONAA >60 10/18/2020 1528   GFRNONAA >89 05/31/2016 1111   GFRAA 109 05/26/2019 1607   GFRAA >89 05/31/2016 1111      Assessment & Plan:   Type 2 diabetes mellitus with hyperglycemia, with long-term current use of insulin (HCC) - POCT glycosylated hemoglobin (Hb A1C) - CBC - Comprehensive metabolic panel - insulin glargine (LANTUS) 100 UNIT/ML Solostar Pen; Inject 20 Units into the skin 2 (two) times daily.  Dispense: 12 mL; Refill: 2 - TSH - AMB Referral to Pharmacy Medication Management  2. Weight loss  - TSH   Follow up:  Follow up in 3 months     Fenton Foy, NP 04/18/2022

## 2022-04-19 ENCOUNTER — Other Ambulatory Visit: Payer: Self-pay | Admitting: Nurse Practitioner

## 2022-04-19 ENCOUNTER — Other Ambulatory Visit: Payer: Self-pay

## 2022-04-19 DIAGNOSIS — E1165 Type 2 diabetes mellitus with hyperglycemia: Secondary | ICD-10-CM

## 2022-04-19 LAB — CBC
Hematocrit: 37.4 % (ref 34.0–46.6)
Hemoglobin: 12.5 g/dL (ref 11.1–15.9)
MCH: 28.3 pg (ref 26.6–33.0)
MCHC: 33.4 g/dL (ref 31.5–35.7)
MCV: 85 fL (ref 79–97)
Platelets: 279 10*3/uL (ref 150–450)
RBC: 4.41 x10E6/uL (ref 3.77–5.28)
RDW: 12.7 % (ref 11.7–15.4)
WBC: 4 10*3/uL (ref 3.4–10.8)

## 2022-04-19 LAB — COMPREHENSIVE METABOLIC PANEL
ALT: 15 IU/L (ref 0–32)
AST: 13 IU/L (ref 0–40)
Albumin/Globulin Ratio: 1.5 (ref 1.2–2.2)
Albumin: 4.2 g/dL (ref 3.9–4.9)
Alkaline Phosphatase: 67 IU/L (ref 44–121)
BUN/Creatinine Ratio: 16 (ref 9–23)
BUN: 10 mg/dL (ref 6–20)
Bilirubin Total: <0.2 mg/dL (ref 0.0–1.2)
CO2: 18 mmol/L — ABNORMAL LOW (ref 20–29)
Calcium: 9.1 mg/dL (ref 8.7–10.2)
Chloride: 106 mmol/L (ref 96–106)
Creatinine, Ser: 0.62 mg/dL (ref 0.57–1.00)
Globulin, Total: 2.8 g/dL (ref 1.5–4.5)
Glucose: 186 mg/dL — ABNORMAL HIGH (ref 70–99)
Potassium: 4.1 mmol/L (ref 3.5–5.2)
Sodium: 139 mmol/L (ref 134–144)
Total Protein: 7 g/dL (ref 6.0–8.5)
eGFR: 117 mL/min/{1.73_m2} (ref >59)

## 2022-04-19 LAB — TSH: TSH: 0.602 u[IU]/mL (ref 0.450–4.500)

## 2022-04-25 NOTE — Chronic Care Management (AMB) (Signed)
   Care Guide Note  04/25/2022 Name: Sansa Alkema MRN: [id number] DOB: 16-Jan-1986  Referred by: Fenton Foy, NP Reason for referral : Care Coordination (Outreach to schedule referral with Pharm d )   Crystin Lechtenberg is a 36 y.o. year old female who is a primary care patient of Fenton Foy, NP. Silvana Trawick was referred to the pharmacist for assistance related to DM.    Successful contact was made with the patient to discuss pharmacy services including being ready for the pharmacist to call at least 5 minutes before the scheduled appointment time, to have medication bottles and any blood sugar or blood pressure readings ready for review. The patient agreed to meet with the pharmacist via with the pharmacist via telephone visit on 05/08/2022.    Noreene Larsson, [place], [place] [address] Direct Dial: [phone] [email or web address]

## 2022-04-30 ENCOUNTER — Other Ambulatory Visit: Payer: Self-pay

## 2022-05-08 ENCOUNTER — Other Ambulatory Visit: Payer: Medicaid Other | Admitting: Pharmacist

## 2022-05-08 NOTE — Progress Notes (Unsigned)
05/08/2022 Name: Tina Brown MRN: [id number] DOB: 20-Jan-1986  Chief Complaint  Patient presents with   Medication Management   Diabetes    Tina Brown is a 36 y.o. year old female who presented for a telephone visit.   They were referred to the pharmacist by their PCP for assistance in managing diabetes. Contacted patient using interpreter [phone].   Patient is participating in a Managed Medicaid Plan:  Yes  Subjective:  Care Team: Primary Care Provider: Fenton Foy, NP ; Next Scheduled Visit: 07/22/22  Medication Access/Adherence  Current Pharmacy:  [facility], [place] [address] Phone: [phone] Fax: [phone]   Patient reports affordability concerns with their medications: No  Patient reports access/transportation concerns to their pharmacy: No  Patient reports adherence concerns with their medications:  No     Diabetes:  Current medications: Lantus 20 units daily twice daily Medications tried in the past: metformin - previously discontinued though patient unsure why  Current glucose readings: 180 fastings; 200s post prandial  Current meal patterns:  - Breakfast: egyptian beans, breads - Lunch: fish, meat, eggplant, salad  - Supper: milk, similar to lunch - Snacks:  - Drinks: does not like water, drinks tea but does not add sweeteners  Patient is currently breastfeeding. Reviewed current data in Pregnancy and Lactation. Metformin is present in breast milk but at very low doses, listed as compatible in breast feeding.   Health Maintenance  Health Maintenance Due  Topic Date Due   COVID-19 Vaccine (1) Never done   FOOT EXAM  05/13/2019   Diabetic kidney evaluation - Urine ACR  12/04/2021   INFLUENZA VACCINE  02/12/2022     Objective: Lab Results  Component Value Date   HGBA1C 8.8 (A) 04/18/2022   HGBA1C 8.8 04/18/2022   HGBA1C 8.8 (A) 04/18/2022   HGBA1C 8.8  (A) 04/18/2022    Lab Results  Component Value Date   CREATININE 0.62 04/18/2022   BUN 10 04/18/2022   NA 139 04/18/2022   K 4.1 04/18/2022   CL 106 04/18/2022   CO2 18 (L) 04/18/2022    Lab Results  Component Value Date   CHOL 201 (H) 06/28/2021   HDL 71 06/28/2021   LDLCALC 119 (H) 06/28/2021   TRIG 60 06/28/2021   CHOLHDL 2.8 06/28/2021    Medications Reviewed Today     Reviewed by Osker Mason, RPH-CPP (Pharmacist) on 05/08/22 at 1401  Med List Status: <None>   Medication Order Taking? Sig Documenting Provider Last Dose Status Informant  Blood Glucose Monitoring Suppl (ACCU-CHEK GUIDE ME) w/Device KIT [id number]  PLEASE CHECK BLOOD GLUCOSE 4 TIMES DAILY. [provider]  Active   Discontinued 06/27/20 1420   glucose blood (ACCU-CHEK GUIDE) test strip [id number]  check blood glucose 4 times daily as directed Vevelyn Francois, NP  Active   insulin glargine (LANTUS) 100 UNIT/ML Solostar Pen [id number] Yes Inject 20 Units into the skin 2 (two) times daily. Fenton Foy, NP Taking Active               Assessment/Plan:   Diabetes: - Currently uncontrolled - Reviewed goal A1c, goal fasting, and goal 2 hour post prandial glucose - Reviewed dietary modifications including: reducing carbohydrate intake  - Recommend to restart metformin with XR 500 mg twice daily. Reduce Lantus to 16 units twice daily. Discussed with PCP  - Recommend to check glucose twice daily, fasting and 2 hour post prandial  Follow Up Plan: 4 weeks  Catie Hedwig Morton, PharmD, [facility] [phone]

## 2022-05-08 NOTE — Patient Instructions (Signed)
 ??? ?? ?????? ?????? ??? ?????!  ???? ?????? ?????????   XR 500 ??? ????? ??????. ??? ?????? ??? 16 ???? ????? ??????.  ??? ???? ????? ?? ???? ????? ?????: 1) ?????? ??? ??? ?? ?????? ??? ??????? ? 2) ??? ?????? ?? ???? ???? ????.  ??????? ???? A1c ??? ?? 7%? ???? ?????? ?????? ????????? ??? ?? 130 ??????? ????? ??? ?????? ?? ?????? ??? ?? 180.  ?????!  Analysa,   It was great talking with you today!  Start metformin XR 500 mg twice daily. Decrease Lantus to 16 units twice daily.   Check your blood sugars twice daily:  1) Fasting, first thing in the morning before breakfast and  2) 2 hours after your largest meal.   For a goal A1c of less than 7%, goal fasting readings are less than 130 and goal 2 hour after meal readings are less than 180.   Take care!  Catie Hedwig Morton, PharmD, [facility] [phone]

## 2022-05-09 ENCOUNTER — Other Ambulatory Visit: Payer: Self-pay

## 2022-05-09 MED ORDER — METFORMIN HCL ER 500 MG PO TB24
500.0000 mg | ORAL_TABLET | Freq: Two times a day (BID) | ORAL | 1 refills | Status: DC
  Filled 2022-05-09: qty 180, 90d supply, fill #0

## 2022-05-10 ENCOUNTER — Other Ambulatory Visit: Payer: Self-pay

## 2022-05-14 ENCOUNTER — Other Ambulatory Visit: Payer: Self-pay

## 2022-05-20 ENCOUNTER — Other Ambulatory Visit: Payer: Self-pay

## 2022-06-03 ENCOUNTER — Other Ambulatory Visit: Payer: Medicaid Other | Admitting: Pharmacist

## 2022-06-03 ENCOUNTER — Other Ambulatory Visit: Payer: Self-pay

## 2022-06-03 DIAGNOSIS — Z794 Long term (current) use of insulin: Secondary | ICD-10-CM

## 2022-06-03 MED ORDER — METFORMIN HCL ER 500 MG PO TB24
1000.0000 mg | ORAL_TABLET | Freq: Two times a day (BID) | ORAL | 1 refills | Status: DC
  Filled 2022-06-03 – 2022-07-24 (×2): qty 180, 45d supply, fill #0

## 2022-06-03 MED ORDER — INSULIN GLARGINE 100 UNIT/ML SOLOSTAR PEN
12.0000 [IU] | PEN_INJECTOR | Freq: Two times a day (BID) | SUBCUTANEOUS | 1 refills | Status: DC
  Filled 2022-06-03 – 2022-07-24 (×2): qty 15, 63d supply, fill #0
  Filled 2022-08-29 – 2022-09-11 (×3): qty 15, 63d supply, fill #1

## 2022-06-03 MED ORDER — PEN NEEDLES 32G X 4 MM MISC
3 refills | Status: DC
  Filled 2022-06-03 – 2022-07-24 (×2): qty 100, 30d supply, fill #0
  Filled 2022-08-29: qty 100, 30d supply, fill #1
  Filled 2022-09-11: qty 100, 30d supply, fill #2

## 2022-06-03 NOTE — Patient Instructions (Addendum)
?????  ??????? ?? ????? ??????!  ?? ?????? ??????????? ??? ????? (  1000 ???) ????? ??????. ??? ?????? ??? 12 ???? ????? ??????.  ??? ???? ????? ?? ???? ????? ?????: 1) ?????? ??? ??? ?? ?????? ??? ??????? ? 2) ??? ?????? ?? ???? ???? ????.  ??????? ???? A1c ??? ?? 7%? ???? ?????? ?????? ????????? ??? ?? 130 ??????? ????? ??? ?????? ?? ?????? ??? ?? 180.   ???? ?? ?? ?? ?????!  ?????!  Daneshia,   Keep up the great work!  Increase metformin to 2 tablets (1000 mg) twice daily. Decrease Lantus to 12 units twice daily.   Check your blood sugars twice daily:  1) Fasting, first thing in the morning before breakfast and  2) 2 hours after your largest meal.   For a goal A1c of less than 7%, goal fasting readings are less than 130 and goal 2 hour after meal readings are less than 180.    Call me with any questions!   Thanks!  Catie Hedwig Morton, PharmD, [facility] [phone]

## 2022-06-03 NOTE — Progress Notes (Signed)
06/03/2022 Name: Tina Brown MRN: [id number] DOB: Mar 15, 1986  Chief Complaint  Patient presents with   Medication Management   Diabetes    Tina Brown is a 36 y.o. year old female who presented for a telephone visit.   They were referred to the pharmacist by their PCP for assistance in managing diabetes.   Patient is participating in a Managed Medicaid Plan:  Yes  Subjective:  Care Team: Primary Care Provider: Fenton Foy, NP ; Next Scheduled Visit: 07/22/22  Medication Access/Adherence  Current Pharmacy:  [facility], [place] [address] Phone: [phone] Fax: [phone]   Patient reports affordability concerns with their medications: No  Patient reports access/transportation concerns to their pharmacy: No  Patient reports adherence concerns with their medications:  No     Diabetes:  Current medications: metformin XR 500 mg twice daily, Lantus 16 units twice daily.   Current glucose readings: fasting: 90-130; 2 hours after meals: 180s  Patient denies hypoglycemic s/sx including dizziness, shakiness, sweating.   Studies in breastfeeding have noted that metformin levels in milk are low, and no adverse effects have been found in breastfed infants of women on metformin.   Health Maintenance  Health Maintenance Due  Topic Date Due   COVID-19 Vaccine (1) Never done   FOOT EXAM  05/13/2019   Diabetic kidney evaluation - Urine ACR  12/04/2021   OPHTHALMOLOGY EXAM  05/29/2022     Objective: Lab Results  Component Value Date   HGBA1C 8.8 (A) 04/18/2022   HGBA1C 8.8 04/18/2022   HGBA1C 8.8 (A) 04/18/2022   HGBA1C 8.8 (A) 04/18/2022    Lab Results  Component Value Date   CREATININE 0.62 04/18/2022   BUN 10 04/18/2022   NA 139 04/18/2022   K 4.1 04/18/2022   CL 106 04/18/2022   CO2 18 (L) 04/18/2022    Lab Results  Component Value Date   CHOL 201 (H) 06/28/2021    HDL 71 06/28/2021   LDLCALC 119 (H) 06/28/2021   TRIG 60 06/28/2021   CHOLHDL 2.8 06/28/2021    Medications Reviewed Today     Reviewed by Osker Mason, RPH-CPP (Pharmacist) on 06/03/22 at 1509  Med List Status: <None>   Medication Order Taking? Sig Documenting Provider Last Dose Status Informant  Blood Glucose Monitoring Suppl (ACCU-CHEK GUIDE ME) w/Device KIT [id number]  PLEASE CHECK BLOOD GLUCOSE 4 TIMES DAILY. [provider]  Active     Discontinued 06/27/20 1420   glucose blood (ACCU-CHEK GUIDE) test strip [id number]  check blood glucose 4 times daily as directed Vevelyn Francois, NP  Active   insulin glargine (LANTUS) 100 UNIT/ML Solostar Pen [id number] Yes Inject 20 Units into the skin 2 (two) times daily. Fenton Foy, NP Taking Active   metFORMIN (GLUCOPHAGE-XR) 500 MG 24 hr tablet [id number] Yes Take 1 tablet (500 mg total) by mouth 2 (two) times daily with a meal. Fenton Foy, NP Taking Active               Assessment/Plan:   Diabetes: - Currently uncontrolled but improving - Reviewed long term cardiovascular and renal outcomes of uncontrolled blood sugar - Reviewed goal A1c, goal fasting, and goal 2 hour post prandial glucose - Discussed option of increasing metformin to 1000 mg twice daily or continuing current regimen and following A1c. Patient elects to increase metformin. Recommend to increase metformin to 1000 mg twice daily, and reduce Basaglar to 12  units twice daily.  - Recommend to check glucose twice daily.   Follow Up Plan: PCP visit in 6 weeks, follow up with PharmD in 6 weeks  Catie Hedwig Morton, PharmD, [facility] [phone]

## 2022-06-07 ENCOUNTER — Other Ambulatory Visit: Payer: Self-pay

## 2022-07-22 ENCOUNTER — Ambulatory Visit: Payer: Self-pay | Admitting: Nurse Practitioner

## 2022-07-25 ENCOUNTER — Other Ambulatory Visit: Payer: Self-pay

## 2022-07-26 ENCOUNTER — Other Ambulatory Visit: Payer: Self-pay

## 2022-08-15 ENCOUNTER — Telehealth: Payer: Self-pay | Admitting: Pharmacist

## 2022-08-15 NOTE — Progress Notes (Signed)
Tina contacted Tina Brown to reschedule my appointment. Routing to scheduler.

## 2022-08-26 ENCOUNTER — Other Ambulatory Visit: Payer: Medicaid Other | Admitting: Pharmacist

## 2022-08-26 ENCOUNTER — Other Ambulatory Visit: Payer: Self-pay

## 2022-08-26 MED ORDER — METFORMIN HCL ER 500 MG PO TB24
500.0000 mg | ORAL_TABLET | Freq: Two times a day (BID) | ORAL | 1 refills | Status: DC
  Filled 2022-08-26 – 2022-08-30 (×3): qty 180, 90d supply, fill #0
  Filled 2022-10-02: qty 180, 90d supply, fill #1

## 2022-08-26 NOTE — Patient Instructions (Addendum)
????? ?????:   1) ?????? ??? ??? ?? ?????? ??? ??????? ? 2) ??? ?????? ?? ???? ???? ????.  ??????? ???? A1c ??? ?? 7%? ???? ?????? ?????? ????????? ??? ?? 130 ??????? ????? ??? ?????? ?? ?????? ??? ?? 180.  ?????!  ???? ?????? ????? ?????   Check your blood sugars twice daily:  1) Fasting, first thing in the morning before breakfast and  2) 2 hours after your largest meal.   For a goal A1c of less than 7%, goal fasting readings are less than 130 and goal 2 hour after meal readings are less than 180.    Take care!  [place], PharmD [phone]

## 2022-08-26 NOTE — Progress Notes (Signed)
08/26/2022 Name: Tina Brown MRN: [id number] DOB: February 08, 1986  Chief Complaint  Patient presents with   Medication Management   Diabetes    Tina Brown is a 37 y.o. year old female who presented for a telephone visit.   They were referred to the pharmacist by their PCP for assistance in managing diabetes.   Patient is participating in a Managed Medicaid Plan:  Yes  Subjective:  Care Team: Primary Care Provider: Fenton Foy, NP ; Next Scheduled Visit: 08/29/22  Medication Access/Adherence  Current Pharmacy:  [facility], [place] [address] Phone: [phone] Fax: [phone]   Patient reports affordability concerns with their medications: No  Patient reports access/transportation concerns to their pharmacy: No  Patient reports adherence concerns with their medications:  No     Diabetes:  Current medications: metformin XR 500 mg twice daily, Lantus 12 units twice daily   Current glucose readings: reports fasting spikes up to 200, but notes she eats around midnight. Reports she doesn't eat a snack every evening. Fasting: 119-120s; reports readings <180 after meal  Patient denies hypoglycemic s/sx including dizziness, shakiness, sweating.    Objective:  Lab Results  Component Value Date   HGBA1C 8.8 (A) 04/18/2022   HGBA1C 8.8 04/18/2022   HGBA1C 8.8 (A) 04/18/2022   HGBA1C 8.8 (A) 04/18/2022    Lab Results  Component Value Date   CREATININE 0.62 04/18/2022   BUN 10 04/18/2022   NA 139 04/18/2022   K 4.1 04/18/2022   CL 106 04/18/2022   CO2 18 (L) 04/18/2022    Lab Results  Component Value Date   CHOL 201 (H) 06/28/2021   HDL 71 06/28/2021   LDLCALC 119 (H) 06/28/2021   TRIG 60 06/28/2021   CHOLHDL 2.8 06/28/2021    Medications Reviewed Today     Reviewed by Tina Brown, RPH-CPP (Pharmacist) on 08/26/22 at 41  Med List Status: <None>   Medication  Order Taking? Sig Documenting Provider Last Dose Status Informant  Blood Glucose Monitoring Suppl (ACCU-CHEK GUIDE ME) w/Device KIT [id number]  PLEASE CHECK BLOOD GLUCOSE 4 TIMES DAILY. [provider]  Active   glipiZIDE (GLUCOTROL) 10 MG tablet [id number]  Take 1 tablet (10 mg total) by mouth 2 (two) times daily before a meal. Tina Francois, NP  Active   glucose blood (ACCU-CHEK GUIDE) test strip [id number]  check blood glucose 4 times daily as directed Tina Francois, NP  Active   insulin glargine (LANTUS) 100 UNIT/ML Solostar Pen [id number] Yes Inject 12 Units into the skin 2 (two) times daily. Tina Foy, NP Taking Active   Insulin Pen Needle (PEN NEEDLES) 32G X 4 MM MISC [id number]  Use to inject insulin daily Tina Foy, NP  Active   metFORMIN (GLUCOPHAGE-XR) 500 MG 24 hr tablet [id number] Yes Take 2 tablets (1,000 mg total) by mouth 2 (two) times daily with a meal. Tina Foy, NP Taking Active               Assessment/Plan:   Diabetes: - Currently uncontrolled but improved - Reviewed long term cardiovascular and renal outcomes of uncontrolled blood sugar - Reviewed goal A1c, goal fasting, and goal 2 hour post prandial glucose - Discussed either leaving medications as is or maximize metformin to allow for reduction in insulin dosing. Patient elects to continue current regimen at this time. - Recommend to continue current regimen at this time.  - Recommend  to check glucose twice daily, fasting and 2 hour post prandial.   Also reports that she is attempting to wean her child but still has a significant breast milk supply. Asked what she could do about this. Baby is a year and a half. Encouraged to discuss with PCP.  Follow Up Plan: PCP visit in 3 weeks  Tina Brown, PharmD, [place], [facility] [phone]

## 2022-08-29 ENCOUNTER — Ambulatory Visit: Payer: Medicaid Other | Admitting: Nurse Practitioner

## 2022-08-29 ENCOUNTER — Encounter: Payer: Self-pay | Admitting: Nurse Practitioner

## 2022-08-29 ENCOUNTER — Other Ambulatory Visit: Payer: Self-pay

## 2022-08-29 VITALS — BP 109/66 | HR 71 | Temp 97.1°F | Wt 147.4 lb

## 2022-08-29 DIAGNOSIS — Z13220 Encounter for screening for lipoid disorders: Secondary | ICD-10-CM | POA: Diagnosis not present

## 2022-08-29 DIAGNOSIS — E1165 Type 2 diabetes mellitus with hyperglycemia: Secondary | ICD-10-CM | POA: Diagnosis not present

## 2022-08-29 DIAGNOSIS — Z794 Long term (current) use of insulin: Secondary | ICD-10-CM | POA: Diagnosis not present

## 2022-08-29 LAB — POCT GLYCOSYLATED HEMOGLOBIN (HGB A1C): Hemoglobin A1C: 7 % — AB (ref 4.0–5.6)

## 2022-08-29 MED ORDER — IBUPROFEN 600 MG PO TABS
600.0000 mg | ORAL_TABLET | Freq: Three times a day (TID) | ORAL | 0 refills | Status: DC | PRN
  Filled 2022-08-29: qty 30, 10d supply, fill #0

## 2022-08-29 NOTE — Patient Instructions (Addendum)
1. Type 2 diabetes mellitus with hyperglycemia, with long-term current use of insulin (HCC)  - POCT glycosylated hemoglobin (Hb A1C) - Microalbumin/Creatinine Ratio, Urine - Ambulatory referral to Podiatry - Ambulatory referral to Ophthalmology - Lipid Panel - CBC - Comprehensive metabolic panel  2. Lipid screening  - Lipid Panel  Follow up:  Follow up in 3 months

## 2022-08-29 NOTE — Progress Notes (Signed)
Subjective:    Patient ID: Tina Brown, female    DOB: Oct 29, 1985, 37 y.o.   MRN: [id number]  Tina Brown is a 38 y.o. female who presents for follow-up of Type 2 diabetes mellitus.  Patient is checking home blood sugars.   Home blood sugar records: BGs range between 120 and 211 How often is blood sugars being checked: 3 times Current symptoms/problems include none and have been stable.  The following portions of the patient's history were reviewed and updated as appropriate: allergies, current medications, past medical history, past social history and problem list.  Review of Systems  Constitutional: Negative.   HENT: Negative.    Eyes: Negative.   Respiratory: Negative.    Cardiovascular: Negative.   Gastrointestinal: Negative.   Genitourinary: Negative.   Musculoskeletal: Negative.   Skin: Negative.   Neurological: Negative.   Endo/Heme/Allergies: Negative.   Psychiatric/Behavioral: Negative.           Objective:    Physical Exam Constitutional:      General: She is not in acute distress. Cardiovascular:     Rate and Rhythm: Normal rate and regular rhythm.  Pulmonary:     Effort: Pulmonary effort is normal.     Breath sounds: Normal breath sounds.  Skin:    General: Skin is warm and dry.  Neurological:     Mental Status: She is alert and oriented to person, place, and time.  Psychiatric:        Mood and Affect: Affect normal.      Blood pressure 109/66, pulse 71, temperature (!) 97.1 F (36.2 C), weight 147 lb 6.4 oz (66.9 kg), SpO2 100 %, currently breastfeeding.  Lab Review    Latest Ref Rng & Units 08/29/2022   11:54 AM 08/29/2022   11:29 AM 04/18/2022   10:39 AM 04/18/2022   10:36 AM 06/28/2021   10:27 AM  Diabetic Labs  HbA1c 4.0 - 5.6 %  7.0  8.8    8.8    8.8    8.8     Chol 100 - 199 mg/dL 203     201   HDL >39 mg/dL 61     71   Calc LDL 0 - 99 mg/dL 127     119   Triglycerides 0 - 149 mg/dL 84     60   Creatinine 0.57 - 1.00 mg/dL 0.71     0.62  0.69       08/29/2022   11:18 AM 04/18/2022   10:10 AM 06/28/2021    9:12 AM 04/20/2021    3:20 PM 03/23/2021   10:17 AM  BP/Weight  Systolic BP 0000000 [id number] 0000000 0000000  Diastolic BP 66 67 68 65 83  Wt. (Lbs) 147.4 132.5 153 154.04 153  BMI 23.09 kg/m2 20.75 kg/m2 23.96 kg/m2 24.13 kg/m2 23.96 kg/m2       No data to display          Destinae  reports that she has never smoked. She has never used smokeless tobacco. She reports that she does not drink alcohol and does not use drugs.     Assessment & Plan:    Type 2 diabetes mellitus with hyperglycemia, with long-term current use of insulin (HCC) - Plan: POCT glycosylated hemoglobin (Hb A1C), Microalbumin/Creatinine Ratio, Urine, Ambulatory referral to Podiatry, Ambulatory referral to Ophthalmology, Lipid Panel, CBC, Comprehensive metabolic panel  Lipid screening - Plan: Lipid Panel  Rx changes: none Education: Reviewed 'ABCs' of diabetes management (respective goals in parentheses):  A1C (<7), blood pressure (<130/80), and cholesterol (LDL <100). Compliance at present is estimated to be excellent. Efforts to improve compliance (if necessary) will be directed at increased exercise. Follow up: 3 months  Lazaro Arms  08/30/22

## 2022-08-30 ENCOUNTER — Other Ambulatory Visit: Payer: Self-pay

## 2022-08-30 ENCOUNTER — Encounter: Payer: Self-pay | Admitting: Nurse Practitioner

## 2022-08-30 LAB — COMPREHENSIVE METABOLIC PANEL
ALT: 12 IU/L (ref 0–32)
AST: 15 IU/L (ref 0–40)
Albumin/Globulin Ratio: 1.9 (ref 1.2–2.2)
Albumin: 4.7 g/dL (ref 3.9–4.9)
Alkaline Phosphatase: 46 IU/L (ref 44–121)
BUN/Creatinine Ratio: 21 (ref 9–23)
BUN: 15 mg/dL (ref 6–20)
Bilirubin Total: 0.3 mg/dL (ref 0.0–1.2)
CO2: 20 mmol/L (ref 20–29)
Calcium: 9.5 mg/dL (ref 8.7–10.2)
Chloride: 103 mmol/L (ref 96–106)
Creatinine, Ser: 0.71 mg/dL (ref 0.57–1.00)
Globulin, Total: 2.5 g/dL (ref 1.5–4.5)
Glucose: 156 mg/dL — ABNORMAL HIGH (ref 70–99)
Potassium: 4.1 mmol/L (ref 3.5–5.2)
Sodium: 138 mmol/L (ref 134–144)
Total Protein: 7.2 g/dL (ref 6.0–8.5)
eGFR: 111 mL/min/{1.73_m2} (ref >59)

## 2022-08-30 LAB — CBC
Hematocrit: 38.3 % (ref 34.0–46.6)
Hemoglobin: 12.8 g/dL (ref 11.1–15.9)
MCH: 28.6 pg (ref 26.6–33.0)
MCHC: 33.4 g/dL (ref 31.5–35.7)
MCV: 86 fL (ref 79–97)
Platelets: 289 10*3/uL (ref 150–450)
RBC: 4.47 x10E6/uL (ref 3.77–5.28)
RDW: 12 % (ref 11.7–15.4)
WBC: 4.2 10*3/uL (ref 3.4–10.8)

## 2022-08-30 LAB — LIPID PANEL
Chol/HDL Ratio: 3.3 ratio (ref 0.0–4.4)
Cholesterol, Total: 203 mg/dL — ABNORMAL HIGH (ref 100–199)
HDL: 61 mg/dL (ref >39)
LDL Chol Calc (NIH): 127 mg/dL — ABNORMAL HIGH (ref 0–99)
Triglycerides: 84 mg/dL (ref 0–149)
VLDL Cholesterol Cal: 15 mg/dL (ref 5–40)

## 2022-08-30 NOTE — Assessment & Plan Note (Signed)
-   POCT glycosylated hemoglobin (Hb A1C) - Microalbumin/Creatinine Ratio, Urine - Ambulatory referral to Podiatry - Ambulatory referral to Ophthalmology - Lipid Panel - CBC - Comprehensive metabolic panel  2. Lipid screening  - Lipid Panel  Follow up:  Follow up in 3 months

## 2022-09-05 ENCOUNTER — Ambulatory Visit: Payer: Medicaid Other | Admitting: Podiatry

## 2022-09-05 DIAGNOSIS — E1165 Type 2 diabetes mellitus with hyperglycemia: Secondary | ICD-10-CM

## 2022-09-05 DIAGNOSIS — E119 Type 2 diabetes mellitus without complications: Secondary | ICD-10-CM

## 2022-09-05 NOTE — Progress Notes (Signed)
  Subjective:  Patient ID: [name], female    DOB: [birth year],  MRN: [id number]  Chief Complaint  Patient presents with   Diabetes    DFC/Exam BS-did not check today A1C-7.0    37 y.o. female presents for diabetic foot exam.  She does have type 2 diabetes with hyperglycemia and does take insulin.  Most recent A1c was 7.  Denies any significant numbness or pain in either foot.  Coming in primarily to ensure that there are no concerns to get a routine foot exam  Past Medical History:  Diagnosis Date   Diabetes mellitus without complication ([place])    Fibroid    Foot callus 08/2019   Hypertension    In vitro fertilization     No Known Allergies  ROS: Negative except as per HPI above  Objective:  General: AAO x3, NAD  Dermatological: With inspection and palpation of the right and left lower extremities there are no open sores, no preulcerative lesions, no rash or signs of infection present. Nails are of normal length thickness and coloration.   Vascular:  Dorsalis Pedis artery and Posterior Tibial artery pedal pulses are 2/4 bilateral.  Capillary fill time < 3 sec to all digits.   Neruologic: Grossly intact via light touch bilateral. Protective threshold intact to all sites bilateral.   Musculoskeletal: No gross boney pedal deformities bilateral. No pain, crepitus, or limitation noted with foot and ankle range of motion bilateral. Muscular strength 5/5 in all groups tested bilateral.  Gait: Unassisted, Nonantalgic.   No images are attached to the encounter.  Radiographs:  Deferred as no current issues Assessment:   1. Encounter for diabetic foot exam ([place])   2. Type 2 diabetes mellitus with hyperglycemia, with long-term current use of insulin ([place])      Plan:  Patient was evaluated and treated and all questions answered.  # Type 2 diabetes with long-term insulin use without evidence of complication of neuropathy or peripheral arterial disease in either foot Patient  educated on diabetes. Discussed proper diabetic foot care and discussed risks and complications of disease. Educated patient in depth on reasons to return to the office immediately should he/she discover anything concerning or new on the feet. All questions answered. Discussed proper shoes as well.  -Continue monitoring for any problems and call if there are any concerns.   Return in about 1 year (around 09/06/2023) for Diabetic foot exam.          Everitt Amber, [place] [facility]

## 2022-09-11 ENCOUNTER — Other Ambulatory Visit: Payer: Self-pay

## 2022-09-12 ENCOUNTER — Other Ambulatory Visit: Payer: Self-pay

## 2022-10-02 ENCOUNTER — Other Ambulatory Visit: Payer: Self-pay

## 2022-10-10 ENCOUNTER — Other Ambulatory Visit: Payer: Self-pay

## 2022-10-24 ENCOUNTER — Ambulatory Visit: Payer: Medicaid Other | Admitting: Obstetrics and Gynecology

## 2022-10-24 ENCOUNTER — Encounter: Payer: Self-pay | Admitting: Obstetrics and Gynecology

## 2022-10-24 ENCOUNTER — Other Ambulatory Visit: Payer: Self-pay

## 2022-10-24 VITALS — BP 115/76 | HR 76 | Wt 145.3 lb

## 2022-10-24 DIAGNOSIS — D251 Intramural leiomyoma of uterus: Secondary | ICD-10-CM

## 2022-10-24 DIAGNOSIS — N979 Female infertility, unspecified: Secondary | ICD-10-CM | POA: Diagnosis not present

## 2022-10-24 NOTE — Progress Notes (Signed)
    GYNECOLOGY VISIT  Patient name: Tina Brown MRN [id number]  Date of birth: 1986-05-15 Chief Complaint:   Gynecologic Exam  History:  Tina Brown is a 37 y.o. G1P1001 being seen today for pre-conception counseling.stopped breastfeeding feb 9 - not frequent at that time  Use of home kits for ovulation: none used  Timed intercourse: yes  For first pregnancy: IVF, in [place] Wants to try to get pregnant  Having regular monthly periods  #check hormones  #check her fibroids #interested in having another baby, attempting for the last 6 months     Past Medical History:  Diagnosis Date   Diabetes mellitus without complication    Fibroid    Foot callus 08/2019   Hypertension    In vitro fertilization     Past Surgical History:  Procedure Laterality Date   CESAREAN SECTION N/A 10/18/2020   Procedure: CESAREAN SECTION;  Surgeon: Kathrynn Running, MD;  Location: [facility] LD ORS;  Service: Obstetrics;  Laterality: N/A;   IVF      The following portions of the patient's history were reviewed and updated as appropriate: allergies, current medications, past family history, past medical history, past social history, past surgical history and problem list.   Health Maintenance:   Last pap     Component Value Date/Time   DIAGPAP  07/06/2020 1545    - Negative for intraepithelial lesion or malignancy (NILM)   HPVHIGH Negative 07/06/2020 1545   ADEQPAP Satisfactory for evaluation.  The absence of an 07/06/2020 1545   ADEQPAP  07/06/2020 1545    endocervical/transformation zone component is not uncommon in pregnant   ADEQPAP patients. 07/06/2020 1545   Last mammogram: n/a   Review of Systems:  Pertinent items are noted in HPI. Comprehensive review of systems was otherwise negative.   Objective:  Physical Exam BP 115/76   Pulse 76   Wt 145 lb 4.8 oz (65.9 kg)   LMP 10/03/2022   Breastfeeding No   BMI 22.76 kg/m    Physical Exam Vitals and nursing note reviewed.   Constitutional:      Appearance: Normal appearance.  HENT:     Head: Normocephalic and atraumatic.  Pulmonary:     Effort: Pulmonary effort is normal.  Skin:    General: Skin is warm and dry.  Neurological:     General: No focal deficit present.     Mental Status: She is alert.  Psychiatric:        Mood and Affect: Mood normal.        Behavior: Behavior normal.        Thought Content: Thought content normal.        Judgment: Judgment normal.       Assessment & Plan:   1. Infertility, female Labs collected that could contribute to infertility. Reviewed that A1c had been elevated 9 months ago and may have been contributing as well. Consider referral to IVF once again if unable to conceive given age and need for prior REI intervention to conceive - Prolactin - FSH - Anti mullerian hormone - Vitamin D (25 hydroxy)  2. Intramural leiomyoma of uterus Pelvic US ordered to follow up fibroid.  - US PELVIC COMPLETE WITH TRANSVAGINAL; Future   Routine preventative health maintenance measures emphasized.  Lorriane Shire, MD [facility]

## 2022-10-28 LAB — ANTI MULLERIAN HORMONE: ANTI-MULLERIAN HORMONE (AMH): 1.68 ng/mL

## 2022-10-28 LAB — FOLLICLE STIMULATING HORMONE: FSH: 5.8 m[IU]/mL

## 2022-10-28 LAB — VITAMIN D 25 HYDROXY (VIT D DEFICIENCY, FRACTURES): Vit D, 25-Hydroxy: 23.6 ng/mL — ABNORMAL LOW (ref 30.0–100.0)

## 2022-10-28 LAB — PROLACTIN: Prolactin: 3.2 ng/mL — ABNORMAL LOW (ref 4.8–33.4)

## 2022-10-29 ENCOUNTER — Other Ambulatory Visit: Payer: Self-pay | Admitting: Obstetrics and Gynecology

## 2022-10-29 ENCOUNTER — Other Ambulatory Visit: Payer: Self-pay

## 2022-10-29 ENCOUNTER — Telehealth: Payer: Self-pay

## 2022-10-29 DIAGNOSIS — R7989 Other specified abnormal findings of blood chemistry: Secondary | ICD-10-CM

## 2022-10-29 MED ORDER — CHOLECALCIFEROL 10 MCG (400 UNIT) PO TABS
400.0000 [IU] | ORAL_TABLET | Freq: Every day | ORAL | 0 refills | Status: DC
Start: 2022-10-29 — End: 2023-03-14
  Filled 2022-10-29: qty 30, 30d supply, fill #0

## 2022-10-29 NOTE — Telephone Encounter (Addendum)
-----   Message from Lorriane Shire, MD sent at 10/29/2022  8:26 AM EDT ----- Notify patient that prolactin is slightly low, which is ok. Vitamin D is slightly low, recommend supplementation. Ovarian reserve is normal.   Called pt with [facility] # [phone] and informed pt her results and provider recommendation to start taking vit D daily that the provider has sent to her [facility].  Pt verbalized understanding.   Addison Naegeli, RN  10/29/22

## 2022-10-30 ENCOUNTER — Ambulatory Visit (HOSPITAL_COMMUNITY): Payer: Medicaid Other

## 2022-11-04 ENCOUNTER — Ambulatory Visit (HOSPITAL_COMMUNITY)
Admission: RE | Admit: 2022-11-04 | Discharge: 2022-11-04 | Disposition: A | Discharge status: Home / Self Care | Payer: Medicaid Other | Source: Ambulatory Visit | Attending: Obstetrics and Gynecology | Admitting: Obstetrics and Gynecology

## 2022-11-04 DIAGNOSIS — D251 Intramural leiomyoma of uterus: Secondary | ICD-10-CM

## 2022-11-05 ENCOUNTER — Other Ambulatory Visit: Payer: Self-pay | Admitting: Obstetrics and Gynecology

## 2022-11-05 DIAGNOSIS — N83202 Unspecified ovarian cyst, left side: Secondary | ICD-10-CM

## 2022-11-06 ENCOUNTER — Telehealth: Payer: Self-pay

## 2022-11-06 NOTE — Telephone Encounter (Addendum)
-----   Message from Lorriane Shire, MD sent at 11/05/2022  5:08 PM EDT ----- Notify that ultrasound shows fibroids are smaller than they were before. There is also an ovarian cyst, recommend a repeat ultrasound in 2-3 months.   Ajewole requests a call- can this patient be contacted and ask if she wanted the REI referral for infertility. I didn't write it clear enough in my note whether or not she was ok with me placing the referral.   Called pt with [facility] # [phone] and left message that I am calling with results and f/u please give office a call back.    Leonette Nutting  11/06/22

## 2022-11-07 NOTE — Telephone Encounter (Signed)
[facility] # [id number] Call placed with [facility]. No answer with mobile number and LVM to return call to office. Also, LVM on husband's number as well.  Judeth Cornfield, RNC

## 2022-11-12 ENCOUNTER — Other Ambulatory Visit: Payer: Self-pay

## 2022-11-12 MED ORDER — RHO D IMMUNE GLOBULIN 1500 UNITS IM SOSY
300.0000 ug | PREFILLED_SYRINGE | Freq: Once | INTRAMUSCULAR | 0 refills | Status: DC
  Filled 2022-11-12: qty 1, 1d supply, fill #0

## 2022-11-12 NOTE — Addendum Note (Signed)
Addended by: Faythe Casa on: 11/12/2022 10:30 AM   Modules accepted: Orders

## 2022-11-12 NOTE — Telephone Encounter (Signed)
Attempted to contact pt unsuccessful. MyChart message sent.  Letter sent.   Leonette Nutting  11/12/22

## 2022-11-25 ENCOUNTER — Ambulatory Visit: Payer: Medicaid Other | Admitting: Nurse Practitioner

## 2022-11-25 ENCOUNTER — Other Ambulatory Visit: Payer: Self-pay

## 2022-11-25 ENCOUNTER — Encounter: Payer: Self-pay | Admitting: Nurse Practitioner

## 2022-11-25 ENCOUNTER — Telehealth: Payer: Self-pay

## 2022-11-25 VITALS — BP 109/62 | HR 83 | Temp 97.1°F | Wt 153.8 lb

## 2022-11-25 DIAGNOSIS — Z794 Long term (current) use of insulin: Secondary | ICD-10-CM | POA: Diagnosis not present

## 2022-11-25 DIAGNOSIS — E1165 Type 2 diabetes mellitus with hyperglycemia: Secondary | ICD-10-CM | POA: Diagnosis not present

## 2022-11-25 DIAGNOSIS — E559 Vitamin D deficiency, unspecified: Secondary | ICD-10-CM | POA: Diagnosis not present

## 2022-11-25 LAB — POCT GLYCOSYLATED HEMOGLOBIN (HGB A1C): Hemoglobin A1C: 7.7 % — AB (ref 4.0–5.6)

## 2022-11-25 MED ORDER — METFORMIN HCL ER 500 MG PO TB24
500.0000 mg | ORAL_TABLET | Freq: Two times a day (BID) | ORAL | 1 refills | Status: DC
  Filled 2022-11-25: qty 180, 90d supply, fill #0

## 2022-11-25 MED ORDER — METFORMIN HCL ER 500 MG PO TB24
1000.0000 mg | ORAL_TABLET | Freq: Two times a day (BID) | ORAL | 1 refills | Status: DC
  Filled 2022-11-25 (×3): qty 180, 45d supply, fill #0

## 2022-11-25 MED ORDER — ACCU-CHEK GUIDE ME W/DEVICE KIT
PACK | 0 refills | Status: DC
Start: 2022-11-25 — End: 2024-04-02
  Filled 2022-11-25: qty 1, 30d supply, fill #0

## 2022-11-25 MED ORDER — PEN NEEDLES 32G X 4 MM MISC
3 refills | Status: DC
  Filled 2022-11-25: qty 100, 25d supply, fill #0
  Filled 2023-05-23: qty 100, 25d supply, fill #1
  Filled 2023-06-12: qty 100, 25d supply, fill #2
  Filled 2023-07-22: qty 100, 25d supply, fill #3

## 2022-11-25 MED ORDER — INSULIN GLARGINE 100 UNIT/ML SOLOSTAR PEN
12.0000 [IU] | PEN_INJECTOR | Freq: Two times a day (BID) | SUBCUTANEOUS | 1 refills | Status: DC
Start: 2022-11-25 — End: 2023-03-14
  Filled 2022-11-25: qty 15, 62d supply, fill #0

## 2022-11-25 NOTE — Telephone Encounter (Signed)
Called pt to advise her to take metformin 500 mg in the am and 500 mg in the evening to equal her new dose. No answer lvm. KH

## 2022-11-25 NOTE — Patient Instructions (Addendum)
1. Type 2 diabetes mellitus with hyperglycemia, with long-term current use of insulin (HCC)  - Microalbumin/Creatinine Ratio, Urine - POCT glycosylated hemoglobin (Hb A1C) - Blood Glucose Monitoring Suppl (ACCU-CHEK GUIDE ME) w/Device KIT; PLEASE CHECK BLOOD GLUCOSE 4 TIMES DAILY.  Dispense: 1 kit; Refill: 0  2. Vitamin D deficiency  - Vitamin D, 25-hydroxy  Follow up:  Follow up in 3 months

## 2022-11-25 NOTE — Progress Notes (Signed)
@Patient  ID: Tina Brown, female    DOB: 17-Jan-1986, 37 y.o.   MRN: [id number]  Chief Complaint  Patient presents with   Diabetes    Referring provider: Ivonne Andrew, NP   HPI  7.7  Tina Brown is a 37 y.o. female who presents for follow-up of Type 2 diabetes mellitus.   Patient is checking home blood sugars.   Home blood sugar records: BGs range between 120s and 2oos How often is blood sugars being checked: 3 times Current symptoms/problems include none and have been stable. A1c at last visit was 7.0 and today is 7.7  Patient does need a vitamin D level rechecked today also.  Denies f/c/s, n/v/d, hemoptysis, PND, leg swelling Denies chest pain or edema     No Known Allergies  Immunization History  Administered Date(s) Administered   Influenza,inj,Quad PF,6+ Mos 04/19/2016, 04/13/2017, 05/12/2018, 06/27/2020   Tdap 04/19/2016, 08/17/2020    Past Medical History:  Diagnosis Date   Diabetes mellitus without complication (HCC)    Fibroid    Foot callus 08/2019   Hypertension    In vitro fertilization     Tobacco History: Social History   Tobacco Use  Smoking Status Never  Smokeless Tobacco Never   Counseling given: Not Answered   Outpatient Encounter Medications as of 11/25/2022  Medication Sig   cholecalciferol (VITAMIN D3) 10 MCG (400 UNIT) TABS tablet Take 1 tablet (400 Units total) by mouth daily.   glucose blood (ACCU-CHEK GUIDE) test strip check blood glucose 4 times daily as directed   ibuprofen (ADVIL) 600 MG tablet Take 1 tablet (600 mg total) by mouth every 8 (eight) hours as needed.   [DISCONTINUED] Blood Glucose Monitoring Suppl (ACCU-CHEK GUIDE ME) w/Device KIT PLEASE CHECK BLOOD GLUCOSE 4 TIMES DAILY.   [DISCONTINUED] insulin glargine (LANTUS) 100 UNIT/ML Solostar Pen Inject 12 Units into the skin 2 (two) times daily.   [DISCONTINUED] Insulin Pen Needle (PEN NEEDLES) 32G X 4 MM MISC Use to inject insulin daily   [DISCONTINUED]  metFORMIN (GLUCOPHAGE-XR) 500 MG 24 hr tablet Take 1 tablet (500 mg total) by mouth 2 (two) times daily with a meal.   Blood Glucose Monitoring Suppl (ACCU-CHEK GUIDE ME) w/Device KIT PLEASE CHECK BLOOD GLUCOSE 4 TIMES DAILY.   insulin glargine (LANTUS) 100 UNIT/ML Solostar Pen Inject 12 Units into the skin 2 (two) times daily.   Insulin Pen Needle (PEN NEEDLES) 32G X 4 MM MISC Use to inject insulin daily   metFORMIN (GLUCOPHAGE-XR) 500 MG 24 hr tablet Take 2 tablets (1,000 mg total) by mouth 2 (two) times daily with a meal.   [DISCONTINUED] metFORMIN (GLUCOPHAGE-XR) 500 MG 24 hr tablet Take 1 tablet (500 mg total) by mouth 2 (two) times daily with a meal.   No facility-administered encounter medications on file as of 11/25/2022.     Review of Systems  Review of Systems  Constitutional: Negative.   HENT: Negative.    Cardiovascular: Negative.   Gastrointestinal: Negative.   Allergic/Immunologic: Negative.   Neurological: Negative.   Psychiatric/Behavioral: Negative.         Physical Exam  BP 109/62   Pulse 83   Temp (!) 97.1 F (36.2 C)   Wt 153 lb 12.8 oz (69.8 kg)   SpO2 100%   BMI 24.09 kg/m   Wt Readings from Last 5 Encounters:  11/25/22 153 lb 12.8 oz (69.8 kg)  10/24/22 145 lb 4.8 oz (65.9 kg)  08/29/22 147 lb 6.4 oz (66.9 kg)  04/18/22 132  lb 8 oz (60.1 kg)  06/28/21 153 lb (69.4 kg)     Physical Exam Vitals and nursing note reviewed.  Constitutional:      General: She is not in acute distress.    Appearance: She is well-developed.  Cardiovascular:     Rate and Rhythm: Normal rate and regular rhythm.  Pulmonary:     Effort: Pulmonary effort is normal.     Breath sounds: Normal breath sounds.  Neurological:     Mental Status: She is alert and oriented to person, place, and time.      Lab Results:  CBC    Component Value Date/Time   WBC 4.2 08/29/2022 1154   WBC 11.4 (H) 10/19/2020 0551   RBC 4.47 08/29/2022 1154   RBC 3.19 (L) 10/19/2020 0551    HGB 12.8 08/29/2022 1154   HCT 38.3 08/29/2022 1154   PLT 289 08/29/2022 1154   MCV 86 08/29/2022 1154   MCH 28.6 08/29/2022 1154   MCH 29.5 10/19/2020 0551   MCHC 33.4 08/29/2022 1154   MCHC 33.5 10/19/2020 0551   RDW 12.0 08/29/2022 1154   LYMPHSABS 1.8 06/28/2021 1027   MONOABS 287 05/31/2016 1111   EOSABS 0.3 06/28/2021 1027   BASOSABS 0.1 06/28/2021 1027    BMET    Component Value Date/Time   NA 138 08/29/2022 1154   K 4.1 08/29/2022 1154   CL 103 08/29/2022 1154   CO2 20 08/29/2022 1154   GLUCOSE 156 (H) 08/29/2022 1154   GLUCOSE 111 (H) 10/19/2020 0551   BUN 15 08/29/2022 1154   CREATININE 0.71 08/29/2022 1154   CREATININE 0.65 05/31/2016 1111   CALCIUM 9.5 08/29/2022 1154   GFRNONAA >60 10/18/2020 1528   GFRNONAA >89 05/31/2016 1111   GFRAA 109 05/26/2019 1607   GFRAA >89 05/31/2016 1111    BNP No results found for: "BNP"  ProBNP No results found for: "PROBNP"  Imaging: No results found.   Assessment & Plan:   Type 2 diabetes mellitus with hyperglycemia, with long-term current use of insulin (HCC) - Microalbumin/Creatinine Ratio, Urine - POCT glycosylated hemoglobin (Hb A1C) - Blood Glucose Monitoring Suppl (ACCU-CHEK GUIDE ME) w/Device KIT; PLEASE CHECK BLOOD GLUCOSE 4 TIMES DAILY.  Dispense: 1 kit; Refill: 0  2. Vitamin D deficiency  - Vitamin D, 25-hydroxy  Follow up:  Follow up in 3 months     Ivonne Andrew, NP 12/11/2022

## 2022-11-26 LAB — MICROALBUMIN / CREATININE URINE RATIO
Creatinine, Urine: 117.6 mg/dL
Microalb/Creat Ratio: <3 mg/g creat (ref 0–29)
Microalbumin, Urine: <3 ug/mL

## 2022-11-26 LAB — VITAMIN D 25 HYDROXY (VIT D DEFICIENCY, FRACTURES): Vit D, 25-Hydroxy: 33.4 ng/mL (ref 30.0–100.0)

## 2022-11-27 ENCOUNTER — Other Ambulatory Visit: Payer: Self-pay

## 2022-11-27 NOTE — Telephone Encounter (Signed)
Caller & Relationship to patient:  MRN #  [id number]   Call Back Number:   Date of Last Office Visit: 11/06/2022     Date of Next Office Visit: Visit date not found    Medication(s) to be Refilled: Meformin and insulin for 3 months   Preferred Pharmacy:   ** Please notify patient to allow 48-72 hours to process** **Let patient know to contact pharmacy at the end of the day to make sure medication is ready. ** **If patient has not been seen in a year or longer, book an appointment **Advise to use MyChart for refill requests OR to contact their pharmacy

## 2022-11-27 NOTE — Telephone Encounter (Signed)
Medication already sent in to pt pharmacy. Gh

## 2022-11-28 ENCOUNTER — Ambulatory Visit: Payer: Self-pay | Admitting: Nurse Practitioner

## 2022-11-28 ENCOUNTER — Other Ambulatory Visit: Payer: Self-pay

## 2022-12-11 NOTE — Assessment & Plan Note (Signed)
-   Microalbumin/Creatinine Ratio, Urine - POCT glycosylated hemoglobin (Hb A1C) - Blood Glucose Monitoring Suppl (ACCU-CHEK GUIDE ME) w/Device KIT; PLEASE CHECK BLOOD GLUCOSE 4 TIMES DAILY.  Dispense: 1 kit; Refill: 0  2. Vitamin D deficiency  - Vitamin D, 25-hydroxy  Follow up:  Follow up in 3 months

## 2023-02-27 ENCOUNTER — Encounter (HOSPITAL_COMMUNITY): Payer: Self-pay

## 2023-02-27 ENCOUNTER — Ambulatory Visit: Payer: Self-pay | Admitting: Nurse Practitioner

## 2023-03-14 ENCOUNTER — Ambulatory Visit: Payer: Medicaid Other | Admitting: Nurse Practitioner

## 2023-03-14 ENCOUNTER — Other Ambulatory Visit: Payer: Self-pay

## 2023-03-14 VITALS — BP 107/60 | HR 73 | Temp 97.2°F | Wt 139.0 lb

## 2023-03-14 DIAGNOSIS — R7989 Other specified abnormal findings of blood chemistry: Secondary | ICD-10-CM

## 2023-03-14 DIAGNOSIS — Z794 Long term (current) use of insulin: Secondary | ICD-10-CM | POA: Diagnosis not present

## 2023-03-14 DIAGNOSIS — E1165 Type 2 diabetes mellitus with hyperglycemia: Secondary | ICD-10-CM

## 2023-03-14 DIAGNOSIS — N926 Irregular menstruation, unspecified: Secondary | ICD-10-CM

## 2023-03-14 LAB — POCT GLYCOSYLATED HEMOGLOBIN (HGB A1C): Hemoglobin A1C: 5.8 % — AB (ref 4.0–5.6)

## 2023-03-14 MED ORDER — INSULIN GLARGINE 100 UNIT/ML SOLOSTAR PEN
12.0000 [IU] | PEN_INJECTOR | Freq: Two times a day (BID) | SUBCUTANEOUS | 1 refills | Status: DC
Start: 2023-03-14 — End: 2023-07-22
  Filled 2023-03-14: qty 15, 63d supply, fill #0
  Filled 2023-05-23: qty 15, 63d supply, fill #1

## 2023-03-14 MED ORDER — IBUPROFEN 600 MG PO TABS
600.0000 mg | ORAL_TABLET | Freq: Three times a day (TID) | ORAL | 0 refills | Status: DC | PRN
  Filled 2023-03-14: qty 30, 10d supply, fill #0

## 2023-03-14 MED ORDER — METFORMIN HCL ER 500 MG PO TB24
1000.0000 mg | ORAL_TABLET | Freq: Two times a day (BID) | ORAL | 1 refills | Status: DC
  Filled 2023-03-14: qty 180, 45d supply, fill #0
  Filled 2023-06-12: qty 180, 45d supply, fill #1

## 2023-03-14 MED ORDER — CHOLECALCIFEROL 10 MCG (400 UNIT) PO TABS
400.0000 [IU] | ORAL_TABLET | Freq: Every day | ORAL | 0 refills | Status: AC
Start: 2023-03-14 — End: ?
  Filled 2023-03-14 – 2023-07-22 (×2): qty 30, 30d supply, fill #0

## 2023-03-14 NOTE — Assessment & Plan Note (Signed)
-   POCT glycosylated hemoglobin (Hb A1C) - insulin glargine (LANTUS) 100 UNIT/ML Solostar Pen; Inject 12 Units into the skin 2 (two) times daily.  Dispense: 15 mL; Refill: 1  2. Low vitamin D level  - cholecalciferol (VITAMIN D3) 10 MCG (400 UNIT) TABS tablet; Take 1 tablet (400 Units total) by mouth daily.  Dispense: 30 tablet; Refill: 0  3. Missed period  - hCG, serum, qualitative   Follow up:  Follow up in 3 months

## 2023-03-14 NOTE — Progress Notes (Signed)
@Patient  ID: Tina Brown, female    DOB: 10-13-1985, 37 y.o.   MRN: [id number]  Chief Complaint  Patient presents with   Diabetes    Referring provider: Ivonne Andrew, NP   HPI  Tina Brown is a 37 y.o. female who presents for follow-up of Type 2 diabetes mellitus.   Patient is checking home blood sugars.   Home blood sugar records: BGs range between 120s and 2oos How often is blood sugars being checked: 3 times Current symptoms/problems include none and have been stable. A1c at last visit was 5.8   Denies f/c/s, n/v/d, hemoptysis, PND, leg swelling Denies chest pain or edema  Note: Patient recently traveled to [place] to have in vitro fertilization.  She would like pregnancy test through blood drawl today.  We will check pregnancy test but we discussed that patient does need to follow-up with her fertility specialist here local that she is back now.   No Known Allergies  Immunization History  Administered Date(s) Administered   Influenza,inj,Quad PF,6+ Mos 04/19/2016, 04/13/2017, 05/12/2018, 06/27/2020   Tdap 04/19/2016, 08/17/2020    Past Medical History:  Diagnosis Date   Diabetes mellitus without complication (HCC)    Fibroid    Foot callus 08/2019   Hypertension    In vitro fertilization     Tobacco History: Social History   Tobacco Use  Smoking Status Never  Smokeless Tobacco Never   Counseling given: Not Answered   Outpatient Encounter Medications as of 03/14/2023  Medication Sig   Blood Glucose Monitoring Suppl (ACCU-CHEK GUIDE ME) w/Device KIT PLEASE CHECK BLOOD GLUCOSE 4 TIMES DAILY.   glucose blood (ACCU-CHEK GUIDE) test strip check blood glucose 4 times daily as directed   Insulin Pen Needle (PEN NEEDLES) 32G X 4 MM MISC Use to inject insulin daily   [DISCONTINUED] cholecalciferol (VITAMIN D3) 10 MCG (400 UNIT) TABS tablet Take 1 tablet (400 Units total) by mouth daily.   [DISCONTINUED] ibuprofen (ADVIL) 600 MG tablet Take 1 tablet (600 mg  total) by mouth every 8 (eight) hours as needed.   [DISCONTINUED] insulin glargine (LANTUS) 100 UNIT/ML Solostar Pen Inject 12 Units into the skin 2 (two) times daily.   [DISCONTINUED] metFORMIN (GLUCOPHAGE-XR) 500 MG 24 hr tablet Take 2 tablets (1,000 mg total) by mouth 2 (two) times daily with a meal.   cholecalciferol (VITAMIN D3) 10 MCG (400 UNIT) TABS tablet Take 1 tablet (400 Units total) by mouth daily.   ibuprofen (ADVIL) 600 MG tablet Take 1 tablet (600 mg total) by mouth every 8 (eight) hours as needed.   insulin glargine (LANTUS) 100 UNIT/ML Solostar Pen Inject 12 Units into the skin 2 (two) times daily.   metFORMIN (GLUCOPHAGE-XR) 500 MG 24 hr tablet Take 2 tablets (1,000 mg total) by mouth 2 (two) times daily with a meal.   No facility-administered encounter medications on file as of 03/14/2023.     Review of Systems  Review of Systems  Constitutional: Negative.   HENT: Negative.    Cardiovascular: Negative.   Gastrointestinal: Negative.   Allergic/Immunologic: Negative.   Neurological: Negative.   Psychiatric/Behavioral: Negative.         Physical Exam  BP 107/60   Pulse 73   Temp (!) 97.2 F (36.2 C)   Wt 139 lb (63 kg)   SpO2 100%   BMI 21.77 kg/m   Wt Readings from Last 5 Encounters:  03/14/23 139 lb (63 kg)  11/25/22 153 lb 12.8 oz (69.8 kg)  10/24/22 145 lb  4.8 oz (65.9 kg)  08/29/22 147 lb 6.4 oz (66.9 kg)  04/18/22 132 lb 8 oz (60.1 kg)     Physical Exam Vitals and nursing note reviewed.  Constitutional:      General: She is not in acute distress.    Appearance: She is well-developed.  Cardiovascular:     Rate and Rhythm: Normal rate and regular rhythm.  Pulmonary:     Effort: Pulmonary effort is normal.     Breath sounds: Normal breath sounds.  Neurological:     Mental Status: She is alert and oriented to person, place, and time.      Lab Results:  CBC    Component Value Date/Time   WBC 4.2 08/29/2022 1154   WBC 11.4 (H)  10/19/2020 0551   RBC 4.47 08/29/2022 1154   RBC 3.19 (L) 10/19/2020 0551   HGB 12.8 08/29/2022 1154   HCT 38.3 08/29/2022 1154   PLT 289 08/29/2022 1154   MCV 86 08/29/2022 1154   MCH 28.6 08/29/2022 1154   MCH 29.5 10/19/2020 0551   MCHC 33.4 08/29/2022 1154   MCHC 33.5 10/19/2020 0551   RDW 12.0 08/29/2022 1154   LYMPHSABS 1.8 06/28/2021 1027   MONOABS 287 05/31/2016 1111   EOSABS 0.3 06/28/2021 1027   BASOSABS 0.1 06/28/2021 1027    BMET    Component Value Date/Time   NA 138 08/29/2022 1154   K 4.1 08/29/2022 1154   CL 103 08/29/2022 1154   CO2 20 08/29/2022 1154   GLUCOSE 156 (H) 08/29/2022 1154   GLUCOSE 111 (H) 10/19/2020 0551   BUN 15 08/29/2022 1154   CREATININE 0.71 08/29/2022 1154   CREATININE 0.65 05/31/2016 1111   CALCIUM 9.5 08/29/2022 1154   GFRNONAA >60 10/18/2020 1528   GFRNONAA >89 05/31/2016 1111   GFRAA 109 05/26/2019 1607   GFRAA >89 05/31/2016 1111     Assessment & Plan:   Type 2 diabetes mellitus with hyperglycemia, with long-term current use of insulin (HCC) - POCT glycosylated hemoglobin (Hb A1C) - insulin glargine (LANTUS) 100 UNIT/ML Solostar Pen; Inject 12 Units into the skin 2 (two) times daily.  Dispense: 15 mL; Refill: 1  2. Low vitamin D level  - cholecalciferol (VITAMIN D3) 10 MCG (400 UNIT) TABS tablet; Take 1 tablet (400 Units total) by mouth daily.  Dispense: 30 tablet; Refill: 0  3. Missed period  - hCG, serum, qualitative   Follow up:  Follow up in 3 months     Ivonne Andrew, NP 03/14/2023

## 2023-03-14 NOTE — Patient Instructions (Signed)
1. Type 2 diabetes mellitus with hyperglycemia, with long-term current use of insulin (HCC)  - POCT glycosylated hemoglobin (Hb A1C) - insulin glargine (LANTUS) 100 UNIT/ML Solostar Pen; Inject 12 Units into the skin 2 (two) times daily.  Dispense: 15 mL; Refill: 1  2. Low vitamin D level  - cholecalciferol (VITAMIN D3) 10 MCG (400 UNIT) TABS tablet; Take 1 tablet (400 Units total) by mouth daily.  Dispense: 30 tablet; Refill: 0  3. Missed period  - hCG, serum, qualitative   Follow up:  Follow up in 3 months

## 2023-03-15 LAB — HCG, SERUM, QUALITATIVE: hCG,Beta Subunit,Qual,Serum: NEGATIVE m[IU]/mL (ref <6)

## 2023-03-18 ENCOUNTER — Telehealth: Payer: Self-pay | Admitting: Nurse Practitioner

## 2023-03-18 NOTE — Telephone Encounter (Signed)
Pt was in office and saw Tonya on Friday. Archie Patten stated to me that the pt had gotten a fertility treatment in another country and was unsure if she was pregnant. Tonya requested that I help the pt make an appointment with her OBGYN Dr. Briscoe Deutscher at the [facility]. They were closed when I tried to call on Friday while the pt was still in the office.   I spoke to [facility] today and was told that for regular GYN appointments she could not get an appt with Dr. Briscoe Deutscher until November, but if it were an OB appt, she can be seen much sooner. So they advised for the pt come in and do a walk-in pregnancy test, then they will call her with the results and schedule her at an appropriate time with Dr. Briscoe Deutscher.   Their walk-in pregnancy test hours are M-F 830-330 and called 12-1 for lunch.   I tried calling pt to inform her of this, spoke to the pts husband who said he would have her call me back.   If I am not here when she calls back please give her this information!  SH

## 2023-03-20 LAB — HM DIABETES EYE EXAM

## 2023-03-26 ENCOUNTER — Telehealth: Payer: Self-pay

## 2023-03-26 NOTE — Telephone Encounter (Signed)
Called pt to advise of normal eye exam. Lvm for pt to call back [facility]

## 2023-04-04 ENCOUNTER — Other Ambulatory Visit: Payer: Self-pay | Admitting: Nurse Practitioner

## 2023-04-04 ENCOUNTER — Other Ambulatory Visit: Payer: Self-pay

## 2023-04-04 MED ORDER — IBUPROFEN 600 MG PO TABS
600.0000 mg | ORAL_TABLET | Freq: Three times a day (TID) | ORAL | 0 refills | Status: AC | PRN
  Filled 2023-04-04: qty 30, 10d supply, fill #0

## 2023-04-07 ENCOUNTER — Other Ambulatory Visit: Payer: Self-pay

## 2023-04-07 DIAGNOSIS — O24113 Pre-existing diabetes mellitus, type 2, in pregnancy, third trimester: Secondary | ICD-10-CM

## 2023-04-07 MED ORDER — ACCU-CHEK GUIDE VI STRP
ORAL_STRIP | 6 refills | Status: DC
  Filled 2023-04-07: qty 100, 25d supply, fill #0

## 2023-04-07 NOTE — Telephone Encounter (Signed)
Done [facility]

## 2023-04-07 NOTE — Telephone Encounter (Signed)
Caller & Relationship to patient:  MRN #  [id number]   Call Back Number:   Date of Last Office Visit: 03/18/2023     Date of Next Office Visit: 03/26/2023    Medication(s) to be Refilled: strips  Preferred Pharmacy:   ** Please notify patient to allow 48-72 hours to process** **Let patient know to contact pharmacy at the end of the day to make sure medication is ready. ** **If patient has not been seen in a year or longer, book an appointment **Advise to use MyChart for refill requests OR to contact their pharmacy

## 2023-04-08 ENCOUNTER — Other Ambulatory Visit: Payer: Self-pay

## 2023-04-08 ENCOUNTER — Telehealth: Payer: Self-pay | Admitting: Nurse Practitioner

## 2023-04-08 NOTE — Telephone Encounter (Signed)
Prescription Request  04/08/2023  LOV: 03/14/2023  What is the name of the medication or equipment? Accue Check Soft clic Lancetts  Have you contacted your pharmacy to request a refill? Yes   Which pharmacy would you like this sent to?  [facility] [address] [place] [address] Phone: [phone] Fax: [phone]    Patient notified that their request is being sent to the clinical staff for review and that they should receive a response within 2 business days.   Please advise at [facility] [phone]

## 2023-04-09 ENCOUNTER — Other Ambulatory Visit: Payer: Self-pay | Admitting: Nurse Practitioner

## 2023-04-09 ENCOUNTER — Other Ambulatory Visit: Payer: Self-pay

## 2023-04-09 MED ORDER — ACCU-CHEK SOFTCLIX LANCETS MISC
12 refills | Status: AC
  Filled 2023-04-09: qty 100, 25d supply, fill #0
  Filled 2023-06-12: qty 100, 25d supply, fill #1
  Filled 2023-09-18: qty 100, 25d supply, fill #2

## 2023-05-05 ENCOUNTER — Other Ambulatory Visit: Payer: Self-pay

## 2023-05-23 ENCOUNTER — Other Ambulatory Visit: Payer: Self-pay

## 2023-06-13 ENCOUNTER — Other Ambulatory Visit: Payer: Self-pay

## 2023-06-16 ENCOUNTER — Other Ambulatory Visit: Payer: Self-pay

## 2023-06-16 ENCOUNTER — Encounter: Payer: Self-pay | Admitting: Nurse Practitioner

## 2023-06-16 ENCOUNTER — Ambulatory Visit: Payer: Medicaid Other | Admitting: Nurse Practitioner

## 2023-06-16 ENCOUNTER — Ambulatory Visit: Payer: Self-pay | Admitting: Nurse Practitioner

## 2023-06-16 VITALS — BP 119/56 | HR 73 | Temp 97.9°F | Wt 155.0 lb

## 2023-06-16 DIAGNOSIS — Z23 Encounter for immunization: Secondary | ICD-10-CM | POA: Diagnosis not present

## 2023-06-16 DIAGNOSIS — Z794 Long term (current) use of insulin: Secondary | ICD-10-CM | POA: Diagnosis not present

## 2023-06-16 DIAGNOSIS — E1165 Type 2 diabetes mellitus with hyperglycemia: Secondary | ICD-10-CM

## 2023-06-16 LAB — POCT GLYCOSYLATED HEMOGLOBIN (HGB A1C): Hemoglobin A1C: 7.2 % — AB (ref 4.0–5.6)

## 2023-06-16 NOTE — Progress Notes (Signed)
Subjective   Patient ID: Tina Brown, female    DOB: 1986/07/12, 37 y.o.   MRN: [id number]  Chief Complaint  Patient presents with   Diabetes    Referring provider: Ivonne Andrew, NP  Tina Brown is a 37 y.o. female with Past Medical History: No date: Diabetes mellitus without complication (HCC) No date: Fibroid 08/2019: Foot callus No date: Hypertension No date: In vitro fertilization   HPI  Patient presents today for a follow-up on diabetes.  She states that she is compliant with medications but did recently travel out of the country.  She states that her blood sugars are hard to control when she is traveling and eating in different areas.  Her A1c today was elevated at 7.2.  We discussed that we will keep her medications the same for now and recheck her A1c in 3 months.  She states that she will do better with her diet. Denies f/c/s, n/v/d, hemoptysis, PND, leg swelling Denies chest pain or edema     No Known Allergies  Immunization History  Administered Date(s) Administered   Influenza,inj,Quad PF,6+ Mos 04/19/2016, 04/13/2017, 05/12/2018, 06/27/2020   Tdap 04/19/2016, 08/17/2020    Tobacco History: Social History   Tobacco Use  Smoking Status Never  Smokeless Tobacco Never   Counseling given: Not Answered   Outpatient Encounter Medications as of 06/16/2023  Medication Sig   Accu-Chek Softclix Lancets lancets Use as instructed   Blood Glucose Monitoring Suppl (ACCU-CHEK GUIDE ME) w/Device KIT PLEASE CHECK BLOOD GLUCOSE 4 TIMES DAILY.   glucose blood (ACCU-CHEK GUIDE) test strip check blood glucose 4 times daily as directed   insulin glargine (LANTUS) 100 UNIT/ML Solostar Pen Inject 12 Units into the skin 2 (two) times daily.   Insulin Pen Needle (PEN NEEDLES) 32G X 4 MM MISC Use to inject insulin daily   metFORMIN (GLUCOPHAGE-XR) 500 MG 24 hr tablet Take 2 tablets (1,000 mg total) by mouth 2 (two) times daily with a meal.   cholecalciferol (VITAMIN D3)  10 MCG (400 UNIT) TABS tablet Take 1 tablet (400 Units total) by mouth daily. (Patient not taking: Reported on 06/16/2023)   ibuprofen (ADVIL) 600 MG tablet Take 1 tablet (600 mg total) by mouth every 8 (eight) hours as needed. (Patient not taking: Reported on 06/16/2023)   No facility-administered encounter medications on file as of 06/16/2023.    Review of Systems  Review of Systems  Constitutional: Negative.   HENT: Negative.    Cardiovascular: Negative.   Gastrointestinal: Negative.   Allergic/Immunologic: Negative.   Neurological: Negative.   Psychiatric/Behavioral: Negative.       Objective:   BP (!) 119/56   Pulse 73   Temp 97.9 F (36.6 C)   Wt 155 lb (70.3 kg)   SpO2 100%   BMI 24.28 kg/m   Wt Readings from Last 5 Encounters:  06/16/23 155 lb (70.3 kg)  03/14/23 139 lb (63 kg)  11/25/22 153 lb 12.8 oz (69.8 kg)  10/24/22 145 lb 4.8 oz (65.9 kg)  08/29/22 147 lb 6.4 oz (66.9 kg)     Physical Exam Vitals and nursing note reviewed.  Constitutional:      General: She is not in acute distress.    Appearance: She is well-developed.  Cardiovascular:     Rate and Rhythm: Normal rate and regular rhythm.  Pulmonary:     Effort: Pulmonary effort is normal.     Breath sounds: Normal breath sounds.  Neurological:     Mental Status: She  is alert and oriented to person, place, and time.       Assessment & Plan:   Type 2 diabetes mellitus with hyperglycemia, with long-term current use of insulin (HCC) -     POCT glycosylated hemoglobin (Hb A1C)  Need for influenza vaccination -     Flu vaccine trivalent PF, 6mos and older(Flulaval,Afluria,Fluarix,Fluzone)     Return in about 3 months (around 09/14/2023) for diabetes.   Ivonne Andrew, NP 06/16/2023

## 2023-06-16 NOTE — Patient Instructions (Signed)
1. Type 2 diabetes mellitus with hyperglycemia, with long-term current use of insulin (HCC)  - POCT glycosylated hemoglobin (Hb A1C)  2. Need for influenza vaccination  - Flu vaccine trivalent PF, 6mos and older(Flulaval,Afluria,Fluarix,Fluzone)  Follow up:  Follow up in 3 months

## 2023-07-22 ENCOUNTER — Other Ambulatory Visit: Payer: Self-pay | Admitting: Nurse Practitioner

## 2023-07-22 DIAGNOSIS — Z794 Long term (current) use of insulin: Secondary | ICD-10-CM

## 2023-07-23 ENCOUNTER — Other Ambulatory Visit: Payer: Self-pay

## 2023-07-23 MED ORDER — METFORMIN HCL ER 500 MG PO TB24
1000.0000 mg | ORAL_TABLET | Freq: Two times a day (BID) | ORAL | 1 refills | Status: DC
  Filled 2023-07-23 – 2023-08-02 (×2): qty 180, 45d supply, fill #0
  Filled 2023-09-18: qty 180, 45d supply, fill #1

## 2023-07-23 MED ORDER — LANTUS SOLOSTAR 100 UNIT/ML ~~LOC~~ SOPN
12.0000 [IU] | PEN_INJECTOR | Freq: Two times a day (BID) | SUBCUTANEOUS | 1 refills | Status: DC
  Filled 2023-07-23 (×2): qty 15, 63d supply, fill #0

## 2023-08-04 ENCOUNTER — Other Ambulatory Visit: Payer: Self-pay

## 2023-08-06 ENCOUNTER — Other Ambulatory Visit: Payer: Self-pay

## 2023-09-15 ENCOUNTER — Ambulatory Visit: Payer: Self-pay | Admitting: Nurse Practitioner

## 2023-09-15 ENCOUNTER — Encounter: Payer: Self-pay | Admitting: Nurse Practitioner

## 2023-09-15 ENCOUNTER — Other Ambulatory Visit: Payer: Self-pay

## 2023-09-15 VITALS — BP 110/73 | HR 75 | Temp 97.9°F | Wt 152.8 lb

## 2023-09-15 DIAGNOSIS — E559 Vitamin D deficiency, unspecified: Secondary | ICD-10-CM

## 2023-09-15 DIAGNOSIS — E1165 Type 2 diabetes mellitus with hyperglycemia: Secondary | ICD-10-CM

## 2023-09-15 DIAGNOSIS — E538 Deficiency of other specified B group vitamins: Secondary | ICD-10-CM | POA: Diagnosis not present

## 2023-09-15 DIAGNOSIS — Z1329 Encounter for screening for other suspected endocrine disorder: Secondary | ICD-10-CM | POA: Diagnosis not present

## 2023-09-15 DIAGNOSIS — Z794 Long term (current) use of insulin: Secondary | ICD-10-CM

## 2023-09-15 DIAGNOSIS — Z13220 Encounter for screening for lipoid disorders: Secondary | ICD-10-CM

## 2023-09-15 LAB — POCT GLYCOSYLATED HEMOGLOBIN (HGB A1C): Hemoglobin A1C: 6.7 % — AB (ref 4.0–5.6)

## 2023-09-15 MED ORDER — LANTUS SOLOSTAR 100 UNIT/ML ~~LOC~~ SOPN
12.0000 [IU] | PEN_INJECTOR | Freq: Two times a day (BID) | SUBCUTANEOUS | 1 refills | Status: DC
  Filled 2023-09-15 – 2023-09-26 (×2): qty 15, 63d supply, fill #0
  Filled 2024-03-10: qty 15, 63d supply, fill #1

## 2023-09-15 MED ORDER — PEN NEEDLES 32G X 4 MM MISC
3 refills | Status: DC
  Filled 2023-09-15: qty 100, 34d supply, fill #0

## 2023-09-15 NOTE — Progress Notes (Signed)
 Subjective   Patient ID: Tina Brown, female    DOB: 08/03/1985, 38 y.o.   MRN: [id number]  Chief Complaint  Patient presents with   Diabetes    Patient stated that her numbers have been up and down     Referring provider: Ivonne Andrew, NP  Tina Brown is a 38 y.o. female with Past Medical History: No date: Diabetes mellitus without complication (HCC) No date: Fibroid 08/2019: Foot callus No date: Hypertension No date: In vitro fertilization  HPI  Patient presents today for a follow-up on diabetes.  She states that she is compliant with medications.  Her A1c today was elevated at 6.7.  Patient states that she will be traveling back to [place] and will be gone for 3 months.  She is going there for IVF treatments.  She will follow-up when she returns. denies f/c/s, n/v/d, hemoptysis, PND, leg swelling Denies chest pain or edema   No Known Allergies  Immunization History  Administered Date(s) Administered   Influenza, Seasonal, Injecte, Preservative Fre 06/16/2023   Influenza,inj,Quad PF,6+ Mos 04/19/2016, 04/13/2017, 05/12/2018, 06/27/2020   Tdap 04/19/2016, 08/17/2020    Tobacco History: Social History   Tobacco Use  Smoking Status Never  Smokeless Tobacco Never   Counseling given: Not Answered   Outpatient Encounter Medications as of 09/15/2023  Medication Sig   Accu-Chek Softclix Lancets lancets Use as instructed   Blood Glucose Monitoring Suppl (ACCU-CHEK GUIDE ME) w/Device KIT PLEASE CHECK BLOOD GLUCOSE 4 TIMES DAILY.   glucose blood (ACCU-CHEK GUIDE) test strip check blood glucose 4 times daily as directed   metFORMIN (GLUCOPHAGE-XR) 500 MG 24 hr tablet Take 2 tablets (1,000 mg total) by mouth 2 (two) times daily with a meal.   [DISCONTINUED] insulin glargine (LANTUS SOLOSTAR) 100 UNIT/ML Solostar Pen Inject 12 Units into the skin 2 (two) times daily.   [DISCONTINUED] Insulin Pen Needle (PEN NEEDLES) 32G X 4 MM MISC Use to inject insulin daily    cholecalciferol (VITAMIN D3) 10 MCG (400 UNIT) TABS tablet Take 1 tablet (400 Units total) by mouth daily. (Patient not taking: Reported on 09/15/2023)   ibuprofen (ADVIL) 600 MG tablet Take 1 tablet (600 mg total) by mouth every 8 (eight) hours as needed. (Patient not taking: Reported on 09/15/2023)   insulin glargine (LANTUS SOLOSTAR) 100 UNIT/ML Solostar Pen Inject 12 Units into the skin 2 (two) times daily.   Insulin Pen Needle (PEN NEEDLES) 32G X 4 MM MISC Use to inject insulin daily   No facility-administered encounter medications on file as of 09/15/2023.    Review of Systems  Review of Systems  Constitutional: Negative.   HENT: Negative.    Cardiovascular: Negative.   Gastrointestinal: Negative.   Allergic/Immunologic: Negative.   Neurological: Negative.   Psychiatric/Behavioral: Negative.       Objective:   BP 110/73   Pulse 75   Temp 97.9 F (36.6 C)   Wt 152 lb 12.8 oz (69.3 kg)   SpO2 100%   BMI 23.93 kg/m   Wt Readings from Last 5 Encounters:  09/15/23 152 lb 12.8 oz (69.3 kg)  06/16/23 155 lb (70.3 kg)  03/14/23 139 lb (63 kg)  11/25/22 153 lb 12.8 oz (69.8 kg)  10/24/22 145 lb 4.8 oz (65.9 kg)     Physical Exam Vitals and nursing note reviewed.  Constitutional:      General: She is not in acute distress.    Appearance: She is well-developed.  Cardiovascular:     Rate and Rhythm:  Normal rate and regular rhythm.  Pulmonary:     Effort: Pulmonary effort is normal.     Breath sounds: Normal breath sounds.  Neurological:     Mental Status: She is alert and oriented to person, place, and time.       Assessment & Plan:   Type 2 diabetes mellitus with hyperglycemia, with long-term current use of insulin (HCC) -     POCT glycosylated hemoglobin (Hb A1C) -     Lantus SoloStar; Inject 12 Units into the skin 2 (two) times daily.  Dispense: 15 mL; Refill: 1 -     CBC -     Comprehensive metabolic panel -     Lipid panel -     TSH -     Vitamin B12 -      VITAMIN D 25 Hydroxy (Vit-D Deficiency, Fractures)  Vitamin D deficiency -     VITAMIN D 25 Hydroxy (Vit-D Deficiency, Fractures)  Vitamin B12 deficiency -     Vitamin B12  Thyroid disorder screen -     TSH  Lipid screening -     Lipid panel  Other orders -     Pen Needles; Use to inject insulin daily  Dispense: 100 each; Refill: 3     Return in about 3 months (around 12/16/2023).   Ivonne Andrew, NP 09/15/2023

## 2023-09-15 NOTE — Patient Instructions (Signed)
 1. Type 2 diabetes mellitus with hyperglycemia, with long-term current use of insulin (HCC) (Primary)  - POCT glycosylated hemoglobin (Hb A1C) - insulin glargine (LANTUS SOLOSTAR) 100 UNIT/ML Solostar Pen; Inject 12 Units into the skin 2 (two) times daily.  Dispense: 15 mL; Refill: 1 - CBC - Comprehensive metabolic panel - Lipid Panel - TSH - Vitamin B12 - Vitamin D, 25-hydroxy  2. Vitamin D deficiency  - Vitamin D, 25-hydroxy  3. Vitamin B12 deficiency  - Vitamin B12  4. Thyroid disorder screen  - TSH  5. Lipid screening  - Lipid Panel

## 2023-09-16 LAB — VITAMIN B12: Vitamin B-12: 460 pg/mL (ref 232–1245)

## 2023-09-16 LAB — COMPREHENSIVE METABOLIC PANEL
ALT: 10 IU/L (ref 0–32)
AST: 16 IU/L (ref 0–40)
Albumin: 4.5 g/dL (ref 3.9–4.9)
Alkaline Phosphatase: 46 IU/L (ref 44–121)
BUN/Creatinine Ratio: 20 (ref 9–23)
BUN: 13 mg/dL (ref 6–24)
Bilirubin Total: 0.3 mg/dL (ref 0.0–1.2)
CO2: 21 mmol/L (ref 20–29)
Calcium: 9.3 mg/dL (ref 8.7–10.2)
Chloride: 105 mmol/L (ref 96–106)
Creatinine, Ser: 0.66 mg/dL (ref 0.57–1.00)
Globulin, Total: 2.6 g/dL (ref 1.5–4.5)
Glucose: 146 mg/dL — ABNORMAL HIGH (ref 70–99)
Potassium: 4.7 mmol/L (ref 3.5–5.2)
Sodium: 138 mmol/L (ref 134–144)
Total Protein: 7.1 g/dL (ref 6.0–8.5)
eGFR: 114 mL/min/{1.73_m2} (ref >59)

## 2023-09-16 LAB — LIPID PANEL
Chol/HDL Ratio: 3.2 ratio (ref 0.0–4.4)
Cholesterol, Total: 197 mg/dL (ref 100–199)
HDL: 61 mg/dL (ref >39)
LDL Chol Calc (NIH): 124 mg/dL — ABNORMAL HIGH (ref 0–99)
Triglycerides: 68 mg/dL (ref 0–149)
VLDL Cholesterol Cal: 12 mg/dL (ref 5–40)

## 2023-09-16 LAB — CBC
Hematocrit: 37.9 % (ref 34.0–46.6)
Hemoglobin: 12.5 g/dL (ref 11.1–15.9)
MCH: 28.7 pg (ref 26.6–33.0)
MCHC: 33 g/dL (ref 31.5–35.7)
MCV: 87 fL (ref 79–97)
Platelets: 277 10*3/uL (ref 150–450)
RBC: 4.36 x10E6/uL (ref 3.77–5.28)
RDW: 12.4 % (ref 11.7–15.4)
WBC: 4.5 10*3/uL (ref 3.4–10.8)

## 2023-09-16 LAB — VITAMIN D 25 HYDROXY (VIT D DEFICIENCY, FRACTURES): Vit D, 25-Hydroxy: 26.1 ng/mL — ABNORMAL LOW (ref 30.0–100.0)

## 2023-09-16 LAB — TSH: TSH: 0.746 u[IU]/mL (ref 0.450–4.500)

## 2023-09-24 ENCOUNTER — Other Ambulatory Visit: Payer: Self-pay

## 2023-09-26 ENCOUNTER — Other Ambulatory Visit: Payer: Self-pay

## 2024-01-19 ENCOUNTER — Ambulatory Visit: Payer: Self-pay | Admitting: Nurse Practitioner

## 2024-03-09 ENCOUNTER — Other Ambulatory Visit: Payer: Self-pay | Admitting: Nurse Practitioner

## 2024-03-09 ENCOUNTER — Other Ambulatory Visit: Payer: Self-pay

## 2024-03-09 MED ORDER — PEN NEEDLES 32G X 4 MM MISC
3 refills | Status: DC
Start: 2024-03-09 — End: 2024-05-17
  Filled 2024-03-09: qty 100, 34d supply, fill #0
  Filled 2024-04-07: qty 100, 34d supply, fill #1
  Filled 2024-05-18: qty 100, 34d supply, fill #2

## 2024-03-09 MED ORDER — METFORMIN HCL ER 500 MG PO TB24
1000.0000 mg | ORAL_TABLET | Freq: Two times a day (BID) | ORAL | 1 refills | Status: DC
  Filled 2024-03-09: qty 180, 45d supply, fill #0
  Filled 2024-05-17: qty 180, 45d supply, fill #1

## 2024-03-09 NOTE — Telephone Encounter (Signed)
 Copied from CRM #[id number]. Topic: Clinical - Medication Refill >> Mar 09, 2024  3:35 PM Tiffini S wrote: Medication: Insulin  Pen Needle (PEN NEEDLES) 32G X 4 MM MISC and  metFORMIN  (GLUCOPHAGE -XR) 500 MG 24 hr tablet   Has the patient contacted their pharmacy? No (Agent: If no, request that the patient contact the pharmacy for the refill. If patient does not wish to contact the pharmacy document the reason why and proceed with request.) (Agent: If yes, when and what did the pharmacy advise?)  This is the patient's preferred pharmacy:  [facility] [address] [place] [address] Phone: [phone] Fax: [phone]  Is this the correct pharmacy for this prescription? Yes If no, delete pharmacy and type the correct one.   Has the prescription been filled recently? Yes  Is the patient out of the medication? Yes  Has the patient been seen for an appointment in the last year OR does the patient have an upcoming appointment? Yes  Can we respond through MyChart? Yes  Agent: Please be advised that Rx refills may take up to 3 business days. We ask that you follow-up with your pharmacy.

## 2024-03-10 ENCOUNTER — Other Ambulatory Visit: Payer: Self-pay

## 2024-04-02 ENCOUNTER — Ambulatory Visit: Payer: Self-pay | Admitting: Nurse Practitioner

## 2024-04-02 ENCOUNTER — Encounter: Payer: Self-pay | Admitting: Nurse Practitioner

## 2024-04-02 ENCOUNTER — Other Ambulatory Visit: Payer: Self-pay

## 2024-04-02 VITALS — BP 123/72 | HR 75 | Wt 147.4 lb

## 2024-04-02 DIAGNOSIS — E1165 Type 2 diabetes mellitus with hyperglycemia: Secondary | ICD-10-CM

## 2024-04-02 DIAGNOSIS — Z794 Long term (current) use of insulin: Secondary | ICD-10-CM | POA: Diagnosis not present

## 2024-04-02 LAB — POCT GLYCOSYLATED HEMOGLOBIN (HGB A1C): Hemoglobin A1C: 7.5 % — AB (ref 4.0–5.6)

## 2024-04-02 MED ORDER — ACCU-CHEK GUIDE ME W/DEVICE KIT
PACK | 0 refills | Status: AC
  Filled 2024-04-02: qty 1, 30d supply, fill #0

## 2024-04-02 MED ORDER — OLOPATADINE HCL 0.1 % OP SOLN
1.0000 [drp] | Freq: Two times a day (BID) | OPHTHALMIC | 12 refills | Status: AC
  Filled 2024-04-02: qty 5, 25d supply, fill #0

## 2024-04-02 NOTE — Progress Notes (Signed)
 Subjective   Patient ID: Tina Brown, female    DOB: 06-15-1986, 38 y.o.   MRN: [id number]  Chief Complaint  Patient presents with   Diabetes    Referring provider: Oley Bascom RAMAN, NP  Tina Brown is a 38 y.o. female with Past Medical History: No date: Diabetes mellitus without complication (HCC) No date: Fibroid 08/2019: Foot callus No date: Hypertension No date: In vitro fertilization   HPI  Patient presents today for a follow-up on diabetes.  She states that she is compliant with medications.  Her A1c today was elevated at 7.5.  Needs eye drops for allergies. denies f/c/s, n/v/d, hemoptysis, PND, leg swelling. Denies chest pain or edema.    No Known Allergies  Immunization History  Administered Date(s) Administered   Influenza, Seasonal, Injecte, Preservative Fre 06/16/2023   Influenza,inj,Quad PF,6+ Mos 04/19/2016, 04/13/2017, 05/12/2018, 06/27/2020   Tdap 04/19/2016, 08/17/2020    Tobacco History: Social History   Tobacco Use  Smoking Status Never  Smokeless Tobacco Never   Counseling given: Not Answered   Outpatient Encounter Medications as of 04/02/2024  Medication Sig   Accu-Chek Softclix Lancets lancets Use as instructed   glucose blood (ACCU-CHEK GUIDE) test strip check blood glucose 4 times daily as directed   insulin  glargine (LANTUS  SOLOSTAR) 100 UNIT/ML Solostar Pen Inject 12 Units into the skin 2 (two) times daily.   Insulin  Pen Needle (PEN NEEDLES) 32G X 4 MM MISC Use to inject insulin  daily   metFORMIN  (GLUCOPHAGE -XR) 500 MG 24 hr tablet Take 2 tablets (1,000 mg total) by mouth 2 (two) times daily with a meal.   olopatadine  (PATADAY ) 0.1 % ophthalmic solution Place 1 drop into both eyes 2 (two) times daily.   [DISCONTINUED] Blood Glucose Monitoring Suppl (ACCU-CHEK GUIDE ME) w/Device KIT PLEASE CHECK BLOOD GLUCOSE 4 TIMES DAILY.   Blood Glucose Monitoring Suppl (ACCU-CHEK GUIDE ME) w/Device KIT PLEASE CHECK BLOOD GLUCOSE 4 TIMES DAILY.    cholecalciferol  (VITAMIN D3) 10 MCG (400 UNIT) TABS tablet Take 1 tablet (400 Units total) by mouth daily. (Patient not taking: Reported on 09/15/2023)   ibuprofen  (ADVIL ) 600 MG tablet Take 1 tablet (600 mg total) by mouth every 8 (eight) hours as needed. (Patient not taking: Reported on 09/15/2023)   No facility-administered encounter medications on file as of 04/02/2024.    Review of Systems  Review of Systems  Constitutional: Negative.   HENT: Negative.    Cardiovascular: Negative.   Gastrointestinal: Negative.   Allergic/Immunologic: Negative.   Neurological: Negative.   Psychiatric/Behavioral: Negative.       Objective:   BP 123/72   Pulse 75   Wt 147 lb 6.4 oz (66.9 kg)   SpO2 99%   BMI 23.09 kg/m   Wt Readings from Last 5 Encounters:  04/02/24 147 lb 6.4 oz (66.9 kg)  09/15/23 152 lb 12.8 oz (69.3 kg)  06/16/23 155 lb (70.3 kg)  03/14/23 139 lb (63 kg)  11/25/22 153 lb 12.8 oz (69.8 kg)     Physical Exam Vitals and nursing note reviewed.  Constitutional:      General: She is not in acute distress.    Appearance: She is well-developed.  Cardiovascular:     Rate and Rhythm: Normal rate and regular rhythm.  Pulmonary:     Effort: Pulmonary effort is normal.     Breath sounds: Normal breath sounds.  Neurological:     Mental Status: She is alert and oriented to person, place, and time.  Assessment & Plan:   Type 2 diabetes mellitus with hyperglycemia, with long-term current use of insulin  (HCC) -     POCT glycosylated hemoglobin (Hb A1C) -     Accu-Chek Guide Me; PLEASE CHECK BLOOD GLUCOSE 4 TIMES DAILY.  Dispense: 1 kit; Refill: 0  Other orders -     Olopatadine  HCl; Place 1 drop into both eyes 2 (two) times daily.  Dispense: 5 mL; Refill: 12     Return in about 3 months (around 07/02/2024).   Bascom GORMAN Borer, NP 04/02/2024

## 2024-04-05 ENCOUNTER — Other Ambulatory Visit: Payer: Self-pay

## 2024-04-07 ENCOUNTER — Other Ambulatory Visit: Payer: Self-pay | Admitting: Nurse Practitioner

## 2024-04-07 ENCOUNTER — Other Ambulatory Visit: Payer: Self-pay

## 2024-04-07 DIAGNOSIS — O24113 Pre-existing diabetes mellitus, type 2, in pregnancy, third trimester: Secondary | ICD-10-CM

## 2024-04-07 MED ORDER — GLUCOSE BLOOD VI STRP
ORAL_STRIP | 6 refills | Status: AC
  Filled 2024-04-07: qty 100, 25d supply, fill #0

## 2024-04-08 ENCOUNTER — Other Ambulatory Visit: Payer: Self-pay

## 2024-05-17 ENCOUNTER — Other Ambulatory Visit: Payer: Self-pay | Admitting: Nurse Practitioner

## 2024-05-17 ENCOUNTER — Telehealth: Payer: Self-pay | Admitting: Nurse Practitioner

## 2024-05-17 ENCOUNTER — Other Ambulatory Visit: Payer: Self-pay

## 2024-05-17 DIAGNOSIS — E1165 Type 2 diabetes mellitus with hyperglycemia: Secondary | ICD-10-CM

## 2024-05-17 MED ORDER — LANTUS SOLOSTAR 100 UNIT/ML ~~LOC~~ SOPN
12.0000 [IU] | PEN_INJECTOR | Freq: Two times a day (BID) | SUBCUTANEOUS | 1 refills | Status: AC
  Filled 2024-05-17: qty 15, 63d supply, fill #0
  Filled 2024-08-13: qty 15, 63d supply, fill #1

## 2024-05-17 NOTE — Telephone Encounter (Unsigned)
 Copied from CRM #[id number]. Topic: Clinical - Medication Refill >> May 17, 2024  3:47 PM Shardie S wrote: Medication: insulin  glargine (LANTUS  SOLOSTAR) 100 UNIT/ML Solostar Pen Insulin  Pen Needle (PEN NEEDLES) 32G X 4 MM MISC  Has the patient contacted their pharmacy? Yes (Agent: If no, request that the patient contact the pharmacy for the refill. If patient does not wish to contact the pharmacy document the reason why and proceed with request.) (Agent: If yes, when and what did the pharmacy advise?)  This is the patient's preferred pharmacy:  [facility] [address], [address] [place] [address] Phone: [phone] Fax: [phone]  Is this the correct pharmacy for this prescription? Yes If no, delete pharmacy and type the correct one.   Has the prescription been filled recently? No  Is the patient out of the medication? Yes  Has the patient been seen for an appointment in the last year OR does the patient have an upcoming appointment? Yes  Can we respond through MyChart? No  Agent: Please be advised that Rx refills may take up to 3 business days. We ask that you follow-up with your pharmacy.

## 2024-05-18 ENCOUNTER — Other Ambulatory Visit: Payer: Self-pay

## 2024-05-19 ENCOUNTER — Other Ambulatory Visit: Payer: Self-pay

## 2024-05-19 MED ORDER — PEN NEEDLES 32G X 4 MM MISC
3 refills | Status: AC
  Filled 2024-05-19: qty 100, fill #0
  Filled 2024-08-13: qty 100, 34d supply, fill #0

## 2024-05-26 ENCOUNTER — Other Ambulatory Visit: Payer: Self-pay

## 2024-06-25 ENCOUNTER — Other Ambulatory Visit: Payer: Self-pay | Admitting: Nurse Practitioner

## 2024-06-25 ENCOUNTER — Other Ambulatory Visit: Payer: Self-pay

## 2024-06-29 ENCOUNTER — Other Ambulatory Visit: Payer: Self-pay

## 2024-06-29 MED ORDER — METFORMIN HCL ER 500 MG PO TB24
1000.0000 mg | ORAL_TABLET | Freq: Two times a day (BID) | ORAL | 1 refills | Status: AC
  Filled 2024-06-29: qty 180, 45d supply, fill #0

## 2024-07-02 ENCOUNTER — Ambulatory Visit: Payer: Self-pay | Admitting: Nurse Practitioner

## 2024-08-13 ENCOUNTER — Other Ambulatory Visit: Payer: Self-pay
